# Patient Record
Sex: Female | Born: 1945 | Race: White | Hispanic: No | Smoking: Never smoker
Health system: Southern US, Community
[De-identification: ages and names within clinical notes are randomized; demographics above are authoritative.]

## PROBLEM LIST (undated history)

## (undated) DIAGNOSIS — M1990 Unspecified osteoarthritis, unspecified site: Secondary | ICD-10-CM

## (undated) DIAGNOSIS — R413 Other amnesia: Secondary | ICD-10-CM

## (undated) DIAGNOSIS — E7800 Pure hypercholesterolemia, unspecified: Secondary | ICD-10-CM

## (undated) DIAGNOSIS — M797 Fibromyalgia: Secondary | ICD-10-CM

## (undated) DIAGNOSIS — I519 Heart disease, unspecified: Secondary | ICD-10-CM

## (undated) DIAGNOSIS — Z9289 Personal history of other medical treatment: Secondary | ICD-10-CM

## (undated) DIAGNOSIS — I219 Acute myocardial infarction, unspecified: Secondary | ICD-10-CM

## (undated) DIAGNOSIS — E119 Type 2 diabetes mellitus without complications: Secondary | ICD-10-CM

## (undated) DIAGNOSIS — L4052 Psoriatic arthritis mutilans: Secondary | ICD-10-CM

## (undated) DIAGNOSIS — I10 Essential (primary) hypertension: Secondary | ICD-10-CM

## (undated) DIAGNOSIS — Z950 Presence of cardiac pacemaker: Secondary | ICD-10-CM

## (undated) DIAGNOSIS — I639 Cerebral infarction, unspecified: Secondary | ICD-10-CM

## (undated) HISTORY — DX: Other amnesia: R41.3

## (undated) HISTORY — DX: Psoriatic arthritis mutilans: L40.52

## (undated) HISTORY — PX: CARDIAC CATHETERIZATION: SHX172

## (undated) HISTORY — DX: Heart disease, unspecified: I51.9

## (undated) HISTORY — PX: TOTAL ABDOMINAL HYSTERECTOMY: SHX209

## (undated) HISTORY — PX: CHOLECYSTECTOMY: SHX55

## (undated) HISTORY — DX: Essential (primary) hypertension: I10

## (undated) HISTORY — PX: KNEE ARTHROSCOPY: SHX127

## (undated) HISTORY — PX: COLONOSCOPY: SHX174

## (undated) HISTORY — PX: CATARACT EXTRACTION W/ INTRAOCULAR LENS  IMPLANT, BILATERAL: SHX1307

## (undated) HISTORY — PX: INSERT / REPLACE / REMOVE PACEMAKER: SUR710

## (undated) HISTORY — PX: TONSILLECTOMY: SUR1361

## (undated) HISTORY — PX: TUBAL LIGATION: SHX77

---

## 1998-09-07 ENCOUNTER — Other Ambulatory Visit: Admission: RE | Admit: 1998-09-07 | Discharge: 1998-09-07 | Payer: Self-pay | Admitting: Obstetrics and Gynecology

## 1999-10-25 ENCOUNTER — Encounter: Admission: RE | Admit: 1999-10-25 | Discharge: 1999-10-25 | Payer: Self-pay

## 1999-11-25 ENCOUNTER — Ambulatory Visit (HOSPITAL_COMMUNITY): Admission: RE | Admit: 1999-11-25 | Discharge: 1999-11-25 | Payer: Self-pay | Admitting: *Deleted

## 2000-08-29 ENCOUNTER — Other Ambulatory Visit: Admission: RE | Admit: 2000-08-29 | Discharge: 2000-08-29 | Payer: Self-pay | Admitting: *Deleted

## 2005-04-08 ENCOUNTER — Ambulatory Visit (HOSPITAL_COMMUNITY): Admission: RE | Admit: 2005-04-08 | Discharge: 2005-04-08 | Payer: Self-pay | Admitting: Unknown Physician Specialty

## 2006-03-27 ENCOUNTER — Inpatient Hospital Stay (HOSPITAL_COMMUNITY): Admission: EM | Admit: 2006-03-27 | Discharge: 2006-04-01 | Payer: Self-pay | Admitting: Emergency Medicine

## 2006-05-28 ENCOUNTER — Emergency Department (HOSPITAL_COMMUNITY): Admission: EM | Admit: 2006-05-28 | Discharge: 2006-05-28 | Payer: Self-pay | Admitting: *Deleted

## 2006-09-26 ENCOUNTER — Encounter: Admission: RE | Admit: 2006-09-26 | Discharge: 2006-09-26 | Payer: Self-pay

## 2007-04-23 ENCOUNTER — Encounter: Admission: RE | Admit: 2007-04-23 | Discharge: 2007-04-23 | Payer: Self-pay

## 2007-08-20 ENCOUNTER — Inpatient Hospital Stay (HOSPITAL_COMMUNITY): Admission: RE | Admit: 2007-08-20 | Discharge: 2007-08-24 | Payer: Self-pay | Admitting: Orthopedic Surgery

## 2007-08-21 ENCOUNTER — Ambulatory Visit: Payer: Self-pay | Admitting: Physical Medicine & Rehabilitation

## 2007-08-23 ENCOUNTER — Encounter (INDEPENDENT_AMBULATORY_CARE_PROVIDER_SITE_OTHER): Payer: Self-pay | Admitting: Orthopedic Surgery

## 2007-08-23 ENCOUNTER — Ambulatory Visit: Payer: Self-pay | Admitting: Surgery

## 2007-08-30 ENCOUNTER — Emergency Department (HOSPITAL_COMMUNITY): Admission: EM | Admit: 2007-08-30 | Discharge: 2007-08-31 | Payer: Self-pay | Admitting: Emergency Medicine

## 2008-09-12 ENCOUNTER — Encounter: Admission: RE | Admit: 2008-09-12 | Discharge: 2008-09-12 | Payer: Self-pay

## 2008-12-09 ENCOUNTER — Encounter: Admission: RE | Admit: 2008-12-09 | Discharge: 2008-12-09 | Payer: Self-pay

## 2008-12-30 ENCOUNTER — Encounter: Admission: RE | Admit: 2008-12-30 | Discharge: 2008-12-30 | Payer: Self-pay

## 2009-11-22 ENCOUNTER — Emergency Department (HOSPITAL_COMMUNITY): Admission: EM | Admit: 2009-11-22 | Discharge: 2009-11-22 | Payer: Self-pay | Admitting: Emergency Medicine

## 2009-12-03 ENCOUNTER — Emergency Department (HOSPITAL_COMMUNITY): Admission: EM | Admit: 2009-12-03 | Discharge: 2009-12-03 | Payer: Self-pay | Admitting: Family Medicine

## 2010-06-29 NOTE — Discharge Summary (Signed)
Kristin Gomez, Kristin Gomez                ACCOUNT NO.:  [id number]   MEDICAL RECORD NO.:  [id number]          PATIENT TYPE:  INP   LOCATION:  5022                         FACILITY:  [facility]   PHYSICIAN:  Almedia Balls. Ranell Patrick, M.D. DATE OF BIRTH:  [birth year]   DATE OF ADMISSION:  08/20/2007  DATE OF DISCHARGE:  08/23/2007                               DISCHARGE SUMMARY   ADMISSION DIAGNOSIS:  Left knee end-stage osteoarthritis.   DISCHARGE DIAGNOSIS:  Left knee end-stage osteoarthritis.   BRIEF HISTORY:  The patient is a 65 year old female with worsening left  knee pain secondary to end-stage osteoarthritis.  The patient elected to  have a left total knee arthroplasty.   PROCEDURE:  The patient had a left total knee arthroplasty by Dr. Malon Kindle on August 20, 2007.   ASSISTANT:  Donnie Coffin. Durwin Nora, P.A.   General anesthesia was used, minimal blood loss, and no complications.   HOSPITAL COURSE:  The patient was admitted on August 20, 2007 for the above-  stated procedure which she tolerated well.  After adequate time in the  postanesthesia care unit, she was transferred up to 5000.  The patient  was complaining about some mild postoperative itching secondary to pain  medicine, but after administration of Benadryl, she was able to tolerate  the pain medicines.  On postop day #1, she has complained about moderate  pain in the left knee.  She did have some mild ecchymosis secondary to  the tourniquet which was seen readily right after surgery was performed,  but seemed to be accentuated on postop day #1, but she had no edema.  Her incision was healing well.  No signs of cellulitis, erythema, or  infection.  On postop day #2, the patient complained about moderate pain  in the left knee and states that the physical therapy was rough the day  before, was able to work through pain and work with physical therapy  quite well on postop day #2.  On postop day #3, the patient complained  about moderate  swelling in the ankle and she was moderately edematous.  She did have some mild calf tenderness.  Her dressing was healing well.  I did order a stat Doppler scan which was negative for DVT for the left  lower extremity and her INR being 1.9 and the patient doing fairly well  and swelling decreased, we decided to discharge her home on August 23, 2007.   DISCHARGE/PLAN:  The patient will be discharged home on August 23, 2007.  Her condition is stable.  Her diet is regular.  The patient will follow  back up with Dr. Malon Kindle in 2 weeks.   The patient has allergies to AMOXICILLIN, ASPIRIN, CELEBREX,  NITROFURANTOIN, MACROBID, PENICILLIN, SULFA, PREDNISONE, and REMICADE.   DISCHARGE MEDICATIONS:  1. Atenolol 50 mg p.o. b.i.d.  2. Digoxin 0.25 mg p.o. daily.  3. Cymbalta 60 mg p.o. daily.  4. Lasix 20 mg p.o. daily.  5. Robaxin 500 mg p.o. q.6 h.  6. Coumadin per pharmacy protocol.  7. Vicodin 5/325 one to tabs q.4-6 h. p.r.n.  pain.      Maisie Fus B. Durwin Nora, P.A.      Almedia Balls. Ranell Patrick, M.D.  Electronically Signed    TBD/MEDQ  D:  08/23/2007  T:  08/24/2007  Job:  [id number]

## 2010-06-29 NOTE — Op Note (Signed)
NAMELAKEITHIA, Kristin Gomez                ACCOUNT NO.:  [id number]   MEDICAL RECORD NO.:  [id number]          PATIENT TYPE:  INP   LOCATION:  5022                         FACILITY:  [facility]   PHYSICIAN:  Almedia Balls. Ranell Patrick, M.D. DATE OF BIRTH:  05/16/1945   DATE OF PROCEDURE:  08/20/2007  DATE OF DISCHARGE:                               OPERATIVE REPORT   PREOPERATIVE DIAGNOSIS:  Left knee end-stage osteoarthritis.   POSTOPERATIVE DIAGNOSIS:  Left knee end-stage osteoarthritis.   PROCEDURE PERFORMED:  Left total knee replacement using DePuy Sigma  rotating-platform prosthesis.   SURGEON:  Almedia Balls. Ranell Patrick, MD   ASSISTANT:  Donnie Coffin. Dixon, PA   ANESTHESIA:  General anesthesia plus femoral block anesthesia was used.   ESTIMATED BLOOD LOSS:  Minimal.   FLUID REPLACEMENT:  1400 mL crystalloid.   URINE OUTPUT:  100 mL.   TOURNIQUET TIME:  One and a quarter hours and 350 mmHg.   INSTRUMENT COUNT:  Correct.   COMPLICATIONS:  Preoperative antibiotics were given.   INDICATIONS:  The patient is a 65 year old female with worsening left  knee arthritis.  The patient now with disabling pain and functional  limitations desiring operative treatment to restore function and  eliminate pain.  Informed consent was obtained.   DESCRIPTION OF PROCEDURE:  After an adequate level of anesthesia was  achieved, the patient was positioned supine on the operating room table.  Foley catheter was placed.  The left leg correctly identified, and a  nonsterile tourniquet placed proximally.  The left leg sterilely prepped  and draped in the usual manner.  We went ahead and made a longitudinal  skin incision with the knee flexed and a tourniquet dissection was  carried down to subcutaneous tissues in the midline.  Medial  parapatellar arthrotomy was created.  Patella was everted, distal femur  is introduced using a step-cut drill.  Distal femoral resection using a  5 degrees to left setting with 10 mm resection.   We then performed  sizing, sizing to a size 3, performing a AP and chamfer cut using a four-  in-one block.  We then resected the ACL, PCL, and remaining meniscal  tissues, sublected to be anteriorly, performed a 90-degree perpendicular  cut to the long axis to the tibia resecting 4 mm off the affected  lateral side.  At this point, we checked the gaps with 10-mm blocks,  they were symmetric in flexion and extension.  We then went ahead and  completed her tibial preparation with sizing to a size 3 and then  performing a modular keel punch and drill to complete her tibial  preparation, we then performed her box cut to the size 3 femur.  I went  ahead and placed the trial components in place, reduced the knee.  We  were happy with the soft tissue balance and alignment.  We then went  ahead and we surfaced the patella going from a 24-mm taking [place] down to  16, resurfacing 8.5 mm of poly with the 32 sized patella.  At this  point, we went ahead and trialed the knee again  and we were happy with  the patellar tracking and soft tissue balance.  Removed all components.  Bone graft at the end of the femur with the available bone graft and  then thoroughly pulse irrigated removing all extraneous bony debris.  We  had also resected the posterior condyle off the back of the femur using  three-quarter-inch curved osteotome.  At this point, we went ahead and  submitted the components into place size 3 left femur, size 3 tibia with  the 12.5 insert placed knee in full extension and submitted the patella  in place, held in place with the patellar clamp.  Once the cement was  allowed to harden and removed excess cement using 0.25-inch curved  osteotome to trial the knee.  We are happy with the 12.5 poly with both  flexion and extension gaps balance and good soft tissue tracking of the  patella.  We next replaced the trial liner with the real 12.5 insert.  We reduced the knee and then thoroughly irrigated  and closed the knee  with interrupted PDS suture #1 for the parapatellar arthrotomy followed  by a 0 and 2-0 Vicryl layer subcutaneous closure and 4-0 Monocryl for  the skin.  Steri-Strips applied followed by sterile dressing.  The  patient tolerated the surgery well.      Almedia Balls. Ranell Patrick, M.D.  Electronically Signed     SRN/MEDQ  D:  08/20/2007  T:  08/21/2007  Job:  [id number]

## 2010-06-29 NOTE — H&P (Signed)
Kristin Gomez, Kristin Gomez                ACCOUNT NO.:  [id number]   MEDICAL RECORD NO.:  [id number]          PATIENT TYPE:  INP   LOCATION:  NA                           FACILITY:  [facility]   PHYSICIAN:  Maisie Fus B. Dixon, P.A.  DATE OF BIRTH:  July 16, 1945   DATE OF ADMISSION:  DATE OF DISCHARGE:                              HISTORY & PHYSICAL   CHIEF COMPLAINTS:  Left knee pain.   HISTORY OF PRESENT ILLNESS:  The patient is a 65 year old female with  worsening left knee pain that has been refractory to conservative  treatment.  The patient has elected to have a left total knee  arthroplasty by Dr. Malon Kindle.   PAST MEDICAL HISTORY:  1. Asthma.  2. Bronchitis.  3. Hypertension.  4. Congestive heart failure.  5. Pacemaker.  6. Hyperlipidemia.  7. GERD.   FAMILY MEDICAL HISTORY:  Coronary artery disease and CVA.   SOCIAL HISTORY:  The patient of Dr. Margo Gomez.  Does not smoke or use  alcohol.   ALLERGIES:  1. PENICILLIN.  2. ASPIRIN.  3. PIZZAS.  4. PREDNISONE.  5. DIOVAN.  6. AMPICILLIN.  7. SULFA.   CURRENT MEDICATIONS:  1. Atenolol 50 mg p.o. b.i.d.  2. Klor-Con 20 mg p.o. daily.  3. Digoxin 0.125 mg p.o. daily.  4. Lasix 20 mg p.o. daily.  5. Prevacid 30 mg p.o. daily.  6. Cymbalta 60 mg p.o. daily.  7. Erythromycin 250 mg t.i.d. for the next 2 days.   REVIEW OF SYSTEMS:  Painful gait and a recent abrasion to the right  lower extremity.   PHYSICAL EXAMINATION:  VITAL SIGNS:  Pulse 80, respirations 18, blood  pressure 142/82.  GENERAL:  The patient is a healthy-appearing 65 year old female in no  acute distress.  Pleasant, blunt affect, and oriented x3.  HEAD/NECK:  Cranial nerves II-XII grossly intact.  Neck shows full range  of motion without any tenderness.  CHEST:  She has active breath sounds bilaterally.  No wheezes, rhonchi,  or rales.  HEART:  Regular rate and rhythm.  No murmur.  ABDOMEN:  Nontender, nondistended with active bowel sounds.  EXTREMITIES:   Moderate tenderness to the left knee with moderate  crepitus.  Pain in the medial joint line.  The patient also has a large  abrasion.  No erythema or purulence to the right anterior leg.  She has  some minimal pedal edema distally.  NEUROVASCULAR:  She is intact.  She does have a normal gait.   X-rays show end-stage osteoarthritis to the left knee.   IMPRESSION:  End-stage osteoarthritis, left knee.   PLAN OF ACTION:  She will have a left total knee arthroplasty by Dr.  Malon Kindle.      Thomas B. Ferne Coe.     TBD/MEDQ  D:  08/15/2007  T:  08/15/2007  Job:  [id number]

## 2010-07-02 NOTE — Procedures (Signed)
Kristin Gomez. [facility]  Patient:    [name], [name]                       MRN: [id number] Adm. Date:  [id number] Attending:  Sabino Gasser                           Procedure Report  PROCEDURE:  Colonoscopy.  INDICATIONS:  Hemoccult positivity.  ANESTHESIA:  No further given.  DESCRIPTION OF PROCEDURE:  With the patient mildly sedated in the left lateral decubitus position, the Olympus videoscopic pediatric colonoscope was inserted in the rectum and passed under direct vision to the cecum.  The cecum was identified by ileocecal valve and appendiceal orifice, both of which were photographed.  From this point, the colonoscope was slowly withdrawn, taking circumferential views of the entire colonic mucosa, stopping only then in the rectum, which appeared normal in direct view and showed internal hemorrhoids in the retroflexed view.  The endoscope was straightened and withdrawn.  The patients vital signs and pulse oximetry remained stable.  The patient tolerated the procedure well without apparent complications.  FINDINGS:  Internal hemorrhoids, otherwise unremarkable colonoscopic examination to the cecum.  PLAN:  Consider repeat examination possibly in five years. DD:  11/25/99 TD:  11/26/99 Job: [id number]

## 2010-07-02 NOTE — Discharge Summary (Signed)
Kristin Gomez, Kristin Gomez                ACCOUNT NO.:  [id number]   MEDICAL RECORD NO.:  [id number]          PATIENT TYPE:  INP   LOCATION:  5504                         FACILITY:  [facility]   PHYSICIAN:  Beckey Rutter, MD  DATE OF BIRTH:  Mar 27, 1945   DATE OF ADMISSION:  03/27/2006  DATE OF DISCHARGE:  04/01/2006                               DISCHARGE SUMMARY   PRIMARY CARE PHYSICIAN:  Dr. Estill Bakes.   CHIEF COMPLAINT UPON ADMISSION:  Weakness, hypertension, and fever.   HISTORY OF PRESENT ILLNESS:  Ms. Oguin is a 65 year old African-  American with multiple medical problems.  Came in with weakness,  hypertension, fever.  The patient was treated for viral syndrome  although influenza type A and B were negative on the antigen screen.  The patient was treated with hydration, but she developed shortness of  breath and wheezes consistent with bronchial asthma.  She is treated for  bronchial asthma as well during this hospitalization and now she is  stable for discharge.  She still has hypokalemia and that will be  treated before discharge.  The patient was encouraged to continue on the  potassium supplementation that she has a prescription for.  The patient  was also encouraged to discuss with her primary physician the results of  the echo and the workup for heart failure since there is some test here  suggestive of elevation of BNP with a question of heart failure in mind.  The 2D echo is not done here.  Waiting for the results of the recent  echo done by the primary care physician, but hence now she is very  stable to be discharged.  We will not wait for the results of the echo.  Neither we would obtain echo right here during this hospitalization.  Patient aware and agrees with the discharge plan and she will be  discharged today on antibiotics to cover the lower left lobe pneumonia.  We will continue on Zithromax 500 mg for two more days and Ceftin for  five more days.   Asthma.   Stable now with no wheezes.  Tapering daily steroid with  discharge.   Psoriatic arthritis/fibromyalgia.  The patient is stable.  we will  continue current management.   Elevation of BNP.  The patient will be continued on Lasix and K-Dur  supplementation.   Hypertension is stable.  Continue on verapamil.  The patient is stable  for discharge today as discussed above.   DISCHARGE DIAGNOSES:  1. Viral syndrome.  2. Exacerbation of acute asthma.  3. Left lower lobe pneumonia.   DISCHARGE MEDICATIONS:  1. Zithromax 500 mg p.o. for two days.  2. Ceftin 500 mg p.o. b.i.d. for five days.  3. Lasix 20 mg p.o. daily.  4. K-Dur 20 mEq p.o. daily.  5. Medrol tapering dose.  6. Protonix p.o. 40 mg daily.  7. Verapamil 240 mg p.o. daily.  8. Elavil 10 mg p.o. at bedtime.   The patient is stable for discharge to follow up with her primary for  further management and medication reconciliation as well.  The  patient  is agreeable with her plan and also agreeable to the fact that there is  no point in staying in the hospital to wait for the results of the echo;  hence, she is clinically stable.      Beckey Rutter, MD  Electronically Signed     EME/MEDQ  D:  04/01/2006  T:  04/02/2006  Job:  [id number]

## 2010-07-02 NOTE — H&P (Signed)
Kristin Gomez, Kristin Gomez                ACCOUNT NO.:  [id number]   MEDICAL RECORD NO.:  [id number]          PATIENT TYPE:  INP   LOCATION:  1828                         FACILITY:  [facility]   PHYSICIAN:  Kristin Gomez, Kristin GomezDATE OF BIRTH:  December 27, 1945   DATE OF ADMISSION:  03/27/2006  DATE OF DISCHARGE:                              HISTORY & PHYSICAL   PRIMARY CARE PHYSICIAN:  Dr. Estill Bakes.   CHIEF COMPLAINT:  1. Weakness.  2. Hypotension.  3. Fever.   HISTORY OF PRESENT ILLNESS:  Ms. Kristin Gomez is a very pleasant 65-  year-old female with a complex medical history to include fibromyalgia.  She receives her primary medical care in [place] but is also followed  by Dr. Estill Bakes here in [place] for her fibromyalgia.  She reports  multiple complaints that have been going on for many months now.  Of  note, it appears that the patient's chronic steroid therapy was  discontinued, possibly in October of 2007, but possibly later.  She  states that since that time she has been not feeling normal.  Among  her complaints she relates intermittent constipation and diarrhea almost  daily.  Approximately 1-1/2 weeks ago she developed cough and subjective  fever and was given azithromycin for bronchitis by her primary care  physician in [place].  She completed this course without fail.  She  also required Diflucan, for a subsequent yeast infection, which she has  taken.  Since that time she has become progressively weaker.  She states  that she has gotten to the point that she could barely get out of bed.  She feels weak, dizzy when she sits up.  She has actually fallen once at  home, landing on her knees, suffering severe abrasions and significant  bleeding from this area.  Due to her progressive weakness and subjective  fever, she presented to Dr. Renaldo Reel office for evaluation today.  There  she was found to be markedly hypotensive with systolics in the 8-90  range.  She was noted to be  tachycardic.  Attempts were made to draw  [name] was not able to be obtained, per her report, due to her  severe dehydration.  As a result, she was transferred to the [facility]  Emergency Room for further evaluation and I was contacted to consider  admission.  Thus far, a urine has not been able to be obtained as the  patient has not produced any.   It is also noteworthy that the patient did have bilateral knee  injections approximately 1 week ago because of severe pain in her knees  due to a longstanding history of osteoarthritis.   REVIEW OF SYSTEMS:  The patient has a very complex review of systems  with positive elements virtually in every category within the review of  systems.  I have attempted to condense these into the History of Present  Illness, as noted above, with other noncontributory factors omitted for  the sake of brevity.   PAST MEDICAL HISTORY:  1. Fibromyalgia.  2. Status post appendectomy.  3. Status post  hysterectomy.  4. Status post tonsillectomy and adenoidectomy.  5. Status post bilateral cataract extractions.  6. Normal esophagogastroduodenoscopy October, 2001.  7. Internal hemorrhoids via colonoscopy October, 2001.  8. Hypertension.  9. Asthma.  10.Gastroesophageal reflux disease.  11.Hyperlipidemia.   OUTPATIENT MEDICATIONS:  1. Asmanex b.i.d.  2. Lasix 20 mg daily.  3. Medrol 12 mg daily - prescribed by Dr. Phylliss Bob.  4. Potassium chloride 40 mEq daily.  5. Verapamil 240 mg daily.  6. Cymbalta 60 mg daily.  7. Elavil 10 mg daily.  8. Lunesta.  9. Ultram 50 mg q.h.s.  10.Vicodin p.r.n.   ALLERGIES:  PATIENT REPORTS ALLERGIES TO:  1. AMOXICILLIN.  2. ASPIRIN.  3. CELEBREX.  4. MACROBID.  5. PENICILLIN.  6. PREDNISONE.  7. SULFA.  8. REMICADE.   FAMILY HISTORY:  The family history is reviewed but is noncontributory  to this admission.   SOCIAL HISTORY:  The patient does not smoke, she does not drink, she  does not use illicit  drugs.  She [place]   DATA REVIEW:  White count and platelet count are normal.  Hemoglobin is  11.1 with an MCV of 89.  Sodium is borderline low at 135, potassium is  normal at 4.1.  Chloride, bicarb, BUN, creatinine and serum glucose are  all normal.  Chest x-ray reveals mild cardiomegaly with moderate  bronchitic change with no focal infiltrate.   PHYSICAL EXAM:  Temperature 101.2, Gomez pressure 112/65, heart rate  123, respiratory rate 20, O2 sats 93% on room air.  GENERAL:  A well-developed, well-nourished female who appears quite  lethargic but is in no acute respiratory distress.  HEENT:  Normocephalic, atraumatic.  Pupils equal, round, reactive to  light and accommodation.  Extraocular muscles intact bilaterally.  OC/OP  clear.  NECK:  No lymphadenopathy, no JVD.  LUNGS:  Clear to auscultation bilaterally without wheezing or rhonchi.  CARDIOVASCULAR:  Tachycardic but regular, without appreciable murmur,  gallop or rub with normal S1 and S2.  ABDOMEN:  Slightly overweight.  Nontender, nondistended, soft, bowel  sounds present.  No hepatosplenomegaly, no rebound, no ascites.  EXTREMITIES:  No significant cyanosis, clubbing, edema bilateral lower  extremities.  NEUROLOGIC:  Strength 5-/5 bilateral upper and lower extremities, intact  sensation and touch throughout.  Cranial nerves II-XII intact  bilaterally.  Alert and oriented x4.  CUTANEOUS:  Patient has multiple severe skin tears on the right anterior  aspect of the lower extremity as well as the left in the region of the  knee.  MUSCULOSKELETAL:  Both knees are tender to palpation but there is no  evidence of joint effusion and no calor.   IMPRESSION AND PLAN:  1. Sign, symptom complex - Kristin Gomez presents with hypotension,      tachycardia, generalized fatigue, borderline low sodium and a     normal potassium.  She has a longstanding history of steroid use      and a recent  history of bronchitis.  The patient is displaying some      of the classic signs and symptoms of an addisonian crisis which was      potentially set off by recent acute infectious bronchitis.  I will      treat her with stress dose steroids via the IV and with hydration      and follow her clinical signs and symptoms.  We must also consider      the possibility of other complicating  factors.  Given the patient's      respiratory symptoms and fever, influenza is a possibility.  The      patient does report that she did have a flu vaccine, but this does      not completely rule out the possibility of influenza.  An influenza      screen will be obtained.  The patient also reports that she has had      frequent urinary tract infections.  Thus far, we have not been able      to obtain urine from the patient.  I will hold on antibiotics due      to the patient's multiple reported allergies until such time that a      UTI can be obtained.  2. Fibromyalgia - This appears to be reasonably well managed at the      present time.  I suspect the patient's high dose of hydrocortisone      will help assist in prevention of an acute flare.  We will follow      her symptomatically.  3. Normocytic anemia - The patient has had a colonoscopy as recent as      October of 2007, at which time benign polyps were found.  No      further GI evaluation concerning the patient's anemia is therefore      indicated.  Given the patient's diagnoses, I think it is safe to      assume that she has a chronic inflammatory state leading to an      anemia of chronic disease.  4. Hypertension - As noted above, the patient is presently      hypotensive.  We will therefore hold her antihypertensives.  5. Asthma - The patient reports a history of asthma.  Presently there      is no evidence of bronchospasm.  We will hold the patient's Asmanex      and follow her symptoms.  6. Chronic steroid use - The exact method by which the  patient's      steroids were discontinued and the exact timing of this      discontinuation is not clear.  What is clear is that Dr. Phylliss Bob had      intended for the patient to begin Medrol at 12 mg a day after her      appointment today.  At such time that the patient is stable for      discharge, we will titrate the patient back down to 12 mg a day and      leave further adjustments to her rheumatologist in the outpatient      setting.      Kristin Gomez, M.D.  Electronically Signed     JTM/MEDQ  D:  03/27/2006  T:  03/27/2006  Job:  [id number]   cc:   Areatha Keas, M.D.

## 2010-07-02 NOTE — Procedures (Signed)
. [facility]  Patient:    Kristin Gomez, Kristin Gomez                       MRN: [id number] Adm. Date:  [id number] Disc. Date: [id number] Attending:  Sabino Gasser                           Procedure Report  PROCEDURE:  Upper endoscopy.  INDICATION:  Hemoccult-positive stool. Abdominal pain.  ANESTHESIA:  Demerol 70 mg.  Versed 7 mg.  PROCEDURE:   With the patient mildly sedated in the left lateral decubitus position, the Olympus videoscopic endoscope was inserted into the mouth, passed under direct vision through the esophagus which appeared normal, into the stomach.  The fundus body, antrum, duodenal bulb, second portion of the duodenum were all well visualized and appeared normal.  Photographs were taken.  From this point, the endoscope was slowly withdrawn, taking circumferential views of the entire duodenal mucosa until the endoscope was pulled back into the stomach, placed in retroflexion view of the stomach from below and this too appeared normal and was photographed.  The endoscope was then straightened and pulled back from distal proximal stomach taking circumferential views of the entire gastric and subsequently esophageal mucosal which otherwise appeared normal.  The patients vital signs and pulse oximetry remained stable.  The patient tolerated the procedure well without apparent complications.  FINDINGS:  Essentially normal endoscopic examination.  PLAN:  Proceed to colonoscopy. DD:  11/25/99 TD:  11/26/99 Job: [id number]

## 2010-11-04 ENCOUNTER — Encounter (HOSPITAL_COMMUNITY): Payer: Self-pay | Admitting: *Deleted

## 2010-11-04 ENCOUNTER — Inpatient Hospital Stay (HOSPITAL_COMMUNITY)
Admission: AD | Admit: 2010-11-04 | Discharge: 2010-11-04 | Disposition: A | Discharge status: Home / Self Care | Payer: Medicare Other | Source: Ambulatory Visit | Attending: Obstetrics and Gynecology | Admitting: Obstetrics and Gynecology

## 2010-11-04 ENCOUNTER — Inpatient Hospital Stay (INDEPENDENT_AMBULATORY_CARE_PROVIDER_SITE_OTHER)
Admission: RE | Admit: 2010-11-04 | Discharge: 2010-11-04 | Disposition: A | Discharge status: Home / Self Care | Payer: Medicare Other | Source: Ambulatory Visit | Attending: Emergency Medicine | Admitting: Emergency Medicine

## 2010-11-04 DIAGNOSIS — N751 Abscess of Bartholin's gland: Secondary | ICD-10-CM | POA: Insufficient documentation

## 2010-11-04 HISTORY — DX: Presence of cardiac pacemaker: Z95.0

## 2010-11-04 HISTORY — DX: Unspecified osteoarthritis, unspecified site: M19.90

## 2010-11-04 HISTORY — DX: Fibromyalgia: M79.7

## 2010-11-04 MED ORDER — LIDOCAINE HCL 2 % EX GEL
Freq: Once | CUTANEOUS | Status: AC
  Administered 2010-11-04: 10 via TOPICAL
  Filled 2010-11-04: qty 20

## 2010-11-04 MED ORDER — DOXYCYCLINE HYCLATE 100 MG PO TABS
100.0000 mg | ORAL_TABLET | Freq: Once | ORAL | Status: AC
  Administered 2010-11-04: 100 mg via ORAL
  Filled 2010-11-04: qty 1

## 2010-11-04 MED ORDER — DOXYCYCLINE HYCLATE 100 MG PO CAPS: 100.0000 mg | ORAL_CAPSULE | Freq: Two times a day (BID) | ORAL | Status: AC

## 2010-11-04 MED ORDER — HYDROCODONE-ACETAMINOPHEN 5-325 MG PO TABS: 2.0000 | ORAL_TABLET | ORAL | Status: AC | PRN

## 2010-11-04 NOTE — Progress Notes (Signed)
Pt sent over from [facility] for bartholins cyst.  States she first noticed it over the weekend but has significantly grown since Monday.  Reports constant pain.

## 2010-11-04 NOTE — ED Provider Notes (Signed)
History     CSN: [id number] Arrival date & time: 11/04/2010  9:53 PM  Chief Complaint  Patient presents with  . Bartholin's Cyst    HPI  (Consider location/radiation/quality/duration/timing/severity/associated sxs/prior treatment)  HPI Kristin Gomez is a 65 y.o. female who presents to [facility] for tenderness and swelling of the right labia. The pain started earlier this week and she went to her primary care doctor but the area was not ready to drain. Tonight she went to Urgent Care and was referred here.  Past Medical History  Diagnosis Date  . Fibromyalgia   . Diabetes mellitus     controlled by diet  . Arthritis   . Pacemaker     Past Surgical History  Procedure Date  . Total abdominal hysterectomy   . Cholecystectomy   . Pacemaker insertion   . Cataract extraction   . Knee surgery     No family history on file.  History  Substance Use Topics  . Smoking status: Never Smoker   . Smokeless tobacco: Not on file  . Alcohol Use: No    OB History    Grav Para Term Preterm Abortions TAB SAB Ect Mult Living                  Review of Systems  Review of Systems  Genitourinary: Positive for vaginal pain.  All other systems reviewed and are negative.    Allergies  Aspirin; Remicade; Amoxicillin; Penicillins; Prednisone; and Sulfa antibiotics  Home Medications  No current outpatient prescriptions on file.  Physical Exam    BP 157/80  Pulse 77  Temp(Src) 98.1 F (36.7 C) (Oral)  Resp 18  Ht 5\' 5"  (1.651 m)  Wt 175 lb (79.379 kg)  BMI 29.12 kg/m2  Physical Exam  Nursing note and vitals reviewed. Constitutional: She is oriented to person, place, and time. She appears well-developed and well-nourished.  HENT:  Head: Normocephalic.  Eyes: EOM are normal.  Neck: Neck supple.  Pulmonary/Chest: Effort normal.  Genitourinary:       Tender swollen area at left Bartholin's gland.   Musculoskeletal: Normal range of motion.  Neurological: She is alert and  oriented to person, place, and time. No cranial nerve deficit.  Skin: Skin is warm and dry.    ED Course  Procedures Consent form signed. Time out.  Patient positioned and draped with sterile towels. Lidocaine gel applied to the area. Patient given hydrocodone for pain prior to discharge from urgent care.  Area cleaned with betadine. Anesthized with lidocaine 1%. Opened and drained with #11 blade. Drained moderate amount of purulent drainage. Probed with curved hemostat to break up loculations. Irrigated with NSS. Word Catheter placed and inflated with 3 cc. Of NS.  Pt. tolerated procedure well.    Labs Reviewed  GLUCOSE, CAPILLARY - Abnormal; Notable for the following:    Glucose-Capillary 161 (*)    All other components within normal limits    Assessment: Bartholin's abscess  Plan:  Discussed with Dr. Jolayne Panther   Doxycycline 100 mg. Po bid x 7 days   Hydrocodone 5/325 for pain   Sitz baths   Follow up in [facility], [place] 11/04/10 2334

## 2010-11-05 NOTE — ED Provider Notes (Signed)
Agree with above note.  Kristin Gomez 11/05/2010 12:29 AM    

## 2010-11-07 ENCOUNTER — Inpatient Hospital Stay (HOSPITAL_COMMUNITY)
Admission: AD | Admit: 2010-11-07 | Discharge: 2010-11-07 | Disposition: A | Discharge status: Home / Self Care | Payer: Medicare Other | Source: Ambulatory Visit | Attending: Obstetrics & Gynecology | Admitting: Obstetrics & Gynecology

## 2010-11-07 ENCOUNTER — Encounter (HOSPITAL_COMMUNITY): Payer: Self-pay

## 2010-11-07 DIAGNOSIS — N750 Cyst of Bartholin's gland: Secondary | ICD-10-CM | POA: Insufficient documentation

## 2010-11-07 NOTE — Progress Notes (Signed)
Patient reports has not taken her Vicodin since early yesterday, vomited x 1 yesterday, experiencing back discomfort. Has not self medicated.

## 2010-11-07 NOTE — ED Provider Notes (Signed)
History     CSN: [id number] Arrival date & time: 11/07/2010 10:22 AM  Chief Complaint  Patient presents with  . Bartholin's Cyst    HPI  (Consider location/radiation/quality/duration/timing/severity/associated sxs/prior treatment)  HPI.nam is a 65 y.o. who presents after her Word Catheter came out. Pt had I&D of Bartholin cyst abscess on 9/20. Is doing soaks at home, taking Rx antibiotic. Small amt pain. Past Medical History  Diagnosis Date  . Fibromyalgia   . Diabetes mellitus     controlled by diet  . Arthritis   . Pacemaker     Past Surgical History  Procedure Date  . Total abdominal hysterectomy   . Cholecystectomy   . Pacemaker insertion   . Cataract extraction   . Knee surgery     No family history on file.  History  Substance Use Topics  . Smoking status: Never Smoker   . Smokeless tobacco: Not on file  . Alcohol Use: No    OB History    Grav Para Term Preterm Abortions TAB SAB Ect Mult Living                  Review of Systems  Review of Systems  Constitutional: Negative for fever and chills.    Allergies  Aspirin; Remicade; Amoxicillin; Penicillins; Prednisone; and Sulfa antibiotics  Home Medications  No current outpatient prescriptions on file.  Physical Exam    BP 120/48  Pulse 51  Temp 98 F (36.7 C)  Resp 18  Physical Exam  Constitutional: She appears well-developed and well-nourished.  Genitourinary:       External genit- appropriate for age, < 1 cm incision L labia majora. No edema or erythema.  Skin: Skin is warm and dry.  Psychiatric: She has a normal mood and affect. Her behavior is normal.    ED Course  Procedures (including critical care time)  Labs Reviewed - No data to display No results found.   No diagnosis found.   MDM Assessment- S/P I&D Bartholin abscess, Word catheter out. Plan- Continue with warm soaks. GYN clinic to call with f/u appt. If symptoms return to return here.        Avon Gully.  Wyat Infinger 11/07/10 1145

## 2010-11-07 NOTE — Progress Notes (Signed)
Patient had Bartholin's cysts with catheter placement on 9/20, catheter came out today, still having a lot of pain .

## 2010-11-11 LAB — BASIC METABOLIC PANEL
BUN: 4 — ABNORMAL LOW
CO2: 26
Calcium: 7.6 — ABNORMAL LOW
Calcium: 7.8 — ABNORMAL LOW
Calcium: 8 — ABNORMAL LOW
Chloride: 107
Creatinine, Ser: 0.65
Creatinine, Ser: 0.69
Creatinine, Ser: 0.74
GFR calc Af Amer: >60
GFR calc Af Amer: >60
GFR calc non Af Amer: >60
GFR calc non Af Amer: >60
GFR calc non Af Amer: >60
Glucose, Bld: 109 — ABNORMAL HIGH
Potassium: 4.3
Sodium: 145

## 2010-11-11 LAB — PROTIME-INR
INR: 1.3
INR: 1.9 — ABNORMAL HIGH
Prothrombin Time: 12.2
Prothrombin Time: 13.2
Prothrombin Time: 22.2 — ABNORMAL HIGH

## 2010-11-11 LAB — CBC
HCT: 30.9 — ABNORMAL LOW
HCT: 42.9
Hemoglobin: 15
MCHC: 34.1
MCHC: 35.5
MCV: 96.7
Platelets: 140 — ABNORMAL LOW
Platelets: 171
Platelets: 293
RBC: 3.1 — ABNORMAL LOW
RDW: 13.8
RDW: 14.2
WBC: 9.5

## 2010-11-11 LAB — ABO/RH: ABO/RH(D): A POS

## 2010-11-11 LAB — DIFFERENTIAL
Eosinophils Relative: 0
Lymphocytes Relative: 21
Lymphs Abs: 2
Monocytes Absolute: 0.9

## 2010-11-11 LAB — URINALYSIS, ROUTINE W REFLEX MICROSCOPIC
Glucose, UA: NEGATIVE
Nitrite: NEGATIVE
Protein, ur: NEGATIVE
Urobilinogen, UA: 0.2

## 2010-11-11 LAB — TYPE AND SCREEN

## 2010-11-12 LAB — CBC
MCHC: 35.2
MCV: 98.5
Platelets: 367
WBC: 7.1

## 2010-11-12 LAB — DIFFERENTIAL
Eosinophils Relative: 2
Lymphs Abs: 2
Monocytes Absolute: 0.9
Monocytes Relative: 12
Neutrophils Relative %: 57

## 2010-11-12 LAB — CULTURE, BLOOD (ROUTINE X 2): Culture: NO GROWTH

## 2010-11-25 ENCOUNTER — Encounter: Payer: Self-pay | Admitting: Family Medicine

## 2010-11-25 ENCOUNTER — Encounter: Payer: Medicare Other | Admitting: Family Medicine

## 2010-11-25 ENCOUNTER — Ambulatory Visit (INDEPENDENT_AMBULATORY_CARE_PROVIDER_SITE_OTHER): Payer: Medicare Other | Admitting: Family Medicine

## 2010-11-25 VITALS — BP 128/79 | HR 72 | Temp 97.0°F | Ht 64.25 in | Wt 172.7 lb

## 2010-11-25 DIAGNOSIS — I10 Essential (primary) hypertension: Secondary | ICD-10-CM

## 2010-11-25 DIAGNOSIS — N951 Menopausal and female climacteric states: Secondary | ICD-10-CM

## 2010-11-25 DIAGNOSIS — IMO0001 Reserved for inherently not codable concepts without codable children: Secondary | ICD-10-CM

## 2010-11-25 DIAGNOSIS — R232 Flushing: Secondary | ICD-10-CM

## 2010-11-25 DIAGNOSIS — M797 Fibromyalgia: Secondary | ICD-10-CM

## 2010-11-25 DIAGNOSIS — E049 Nontoxic goiter, unspecified: Secondary | ICD-10-CM

## 2010-11-25 DIAGNOSIS — N750 Cyst of Bartholin's gland: Secondary | ICD-10-CM

## 2010-11-25 NOTE — Progress Notes (Signed)
  Subjective:    Patient ID: Kristin Gomez, female    DOB: [birth year], 65 y.o.   MRN: [id number]  HPI Patient presents for follow-up from a Bartholin's Gland cyst that was treated in [facility] on 9/20 and seen again after the Ward Catheter fell out on 9/23. Patient reports this was her first issue with the bartholin's gland and that she no longer has any symptoms. She completed her antibiotics although they did make her nauseas. She is not currently sexually active. She sees her Primary Doc for routine health maintenence, and gets her mammograms and well checks with him. She reports always having a normal pap test. She is menopausal. She complains only of hot flashes and sweating all the time today. She states her primary doc states it has nothing to do with her hormones and several months ago checked her thyroid. She has a history of thyroid goiter.  Review of Systems    Neg for fevers, chills, weight los Recently had some lesions on her forearms treated w/ freezing Menopausal Objective:   Physical Exam General: AAO, NAD Heart: RRR, no murmurs Lungs: CTA B/L External Genitalia: Minimal hair Internal Genitalia: loss of rugae and moisture, no erythema, no swelling of bartholin's gland on either side; mild TTP of vaginal opening Speculum exam not performed/indicated     Assessment & Plan:  Bartholin's Gland Cyst: Resolved  Hot Flashes/Sweating: May be related to menopause; this is discussed. She has a history of goiter, so we will check her TFTs today. I will contact her with the results when they are available so that she does not have to have another appointment. If her TFTs are abnormal, the patient understands she will work with her [facility] for treatment. Information is provided on menopause and herbals that are often useful for her symptoms.

## 2010-11-25 NOTE — Patient Instructions (Signed)
Menopause Menopause is the normal time of life when menstrual periods stop completely. Menopause is complete when you have missed 12 consecutive menstrual periods. It usually occurs between the ages of 27-55, with an average age of 39. Very rarely does a woman develop menopause before 65 years old. At menopause, your ovaries stop producing the female hormones, estrogen and progesterone. This can cause undesirable symptoms and also affect your health. Sometimes the symptoms may occur 4-5 years before the menopause begins. There is no relationship between oral contraceptives, number of children you had, race or the age your menstrual periods started (menarche). Heavy smokers and very thin women may develop menopause earlier in life. CAUSE  The ovaries stop producing the female hormones estrogen and progesterone.   Other causes include:   Surgery to remove both ovaries.  The ovaries stop functioning for no known reason.   Tumors of the pituitary gland in the brain.   Medical disease that affects the ovaries and hormone production.  Radiation treatment to the abdomen or pelvis.   Chemotherapy that affects the ovaries.   SYMPTOMS  Hot flashes.  Night sweats.   Decrease in sex drive.   Vaginal dryness and shrinking of the size of the genital organs causing painful intercourse.   Dryness of the skin and developing wrinkles.  Headaches.   Tiredness.   Irritability.   Memory problems.  Weight gain.   Bladder infections.   Hair growth of the face and chest.   Infertility.   More serious symptoms include:  Loss of bone (osteoporosis) causing breaks (fractures).   Depression.   Hardening and narrowing of the arteries (atherosclerosis) causing heart attacks and strokes.  DIAGNOSIS  When the menstrual periods have stopped for 12 straight months.   Physical exam.   Hormone studies of the blood.  TREATMENT There are many treatment choices and nearly as many questions about  them. The decisions to treat or not to treat menopausal changes is an individual decision made with your caregiver. Your caregiver can discuss the treatments with you. Together, you can decide which treatment will work best for you such as:  Hormone replacement treatment.  Treat the individual symptoms with medication (ex. tranquilizer for depression).   Herbal medications that may help specific symptoms.  Counseling by a psychiatrist or psychologist.   Group therapy.   No treatment.   HOME CARE INSTRUCTIONS  Take the medication your caregiver gives you as directed.   Get plenty of sleep and rest.   Exercise regularly.   Eat a diet that contains calcium (good for the bones) and soy products (acts like estrogen hormone).   Avoid alcoholic beverages.   Do not smoke.   Taking vitamin E may help in certain cases.   If you have hot flashes, dress in layers.   Take supplements, calcium and vitamin D to strengthen bones.   You can use over-the-counter vaginal cream for vaginal dryness.   Group therapy is sometimes very helpful.   Acupuncture may be helpful in some cases.  SEEK MEDICAL CARE IF:  You are not sure you are in the menopause.   You are having menopausal symptoms and need advice and treatment.   You are still having menstrual periods after age 50.   You have pain with intercourse.   You are in the menopause (no menstrual period for 12 months) and develop vaginal bleeding.   You need a referral to a specialist (gynecologist, psychiatrist or psychologist) for treatment.  SEEK IMMEDIATE MEDICAL CARE  IF:  You have severe depression.   You have excessive vaginal bleeding.   You fell and think you have a broken bone.   You have pain when you urinate.   You develop leg or chest pain.   You have a fast pounding heart beat (palpitations).   You have severe headaches.   You develop vision problems.   You feel a lump in your breast.   You have abdominal  pain or severe indigestion.  Document Released: 04/23/2003 Document Re-Released: 12/04/2007 [facility] Patient Information 2011 [place], [place]. Menopause and Herbal Products Menopause is the normal time of life when menstrual periods stop completely. Menopause is complete when you have missed 12 consecutive menstrual periods. It usually occurs between the ages of 25-55, with an average age of 46. Very rarely does a woman develop menopause before 65 years old. At menopause, your ovaries stop producing the female hormones, estrogen and progesterone. This can cause undesirable symptoms and also affect your health. Sometimes the symptoms may occur 4-5 years before the menopause begins. There is no relationship between oral contraceptives, number of children you had, race or the age your menstrual periods started (menarche). Heavy smokers and very thin women may develop menopause earlier in life. Estrogen and progesterone hormone treatment is the usual method of treating menopausal symptoms. However, there are women who should not take hormone treatment. This is true of:   Women that have breast or uterine cancer.   Women who prefer not to take hormones because of certain side effects (abnormal uterine bleeding).   Women who are afraid that hormones may cause breast cancer.  For these women, there are other medications that may help treat their menopausal symptoms. These medications are found in plants and botanical products. They can be found in the form of herbs, teas, oils, tinctures and pills.  CAUSES OF MENOPAUSE:  The ovaries stop producing the female hormones estrogen and progesterone.   Other causes include:   Surgery to remove both ovaries.  The ovaries stop functioning for no known reason.   Tumors of the pituitary gland in the brain.   Medical disease that affects the ovaries and hormone production.  Radiation treatment to the abdomen or pelvis.   Chemotherapy that affects the  ovaries.   PHYTOESTROGENS: Phytoestrogen's occur naturally in plants and plant products. They act like estrogen in the body. Herbal medications are made from these plants and botanical steroids. There are three types of phytoestrogens:  Isoflavones (genistein and daidzein) are found in soy, garbanzo beans, miso and tofu foods.   Ligins are found in the shell of seeds. They are used to make oils like flaxseed oil. The bacteria in your intestine act on these foods to produce the estrogen-like hormones.   Coumestans are more estrogen-like for animals than humans. They are found in sunflower seeds and bean sprouts.  TREATMENT WITH HERBAL MEDICATIONS FOR:  Hot flashes and night sweats.   Soy, black cohosh and evening primrose.   Irritability, insomnia, depression and memory problems.   Casteberry, ginseng, and soy.   St. John's wort may be helpful for depression. However, there is a concern of it causing cataracts of the eye and may have bad effects on other medications. St. John's wort should not be taken for long time and without your caregiver's advice.   Loss of libido and vaginal and skin dryness.   Wild yam and soy.   Prevention of coronary heart disease and osteoporosis.   Soy and Isoflavones.  Other  forms of treatment to help women with menopausal symptoms include a balanced diet, rest, exercise, vitamin and calcium (with vitamin D) supplements, acupuncture and group therapy when necessary. WOMEN WHO SHOULD NOT TAKE HERBAL MEDICATIONS:  Infants, children and elderly women unless told by your caregiver.   Women who are planning on getting pregnant unless told by your caregiver.   Women who are breastfeeding unless told by your caregiver.   Women who are taking other prescription medications unless told by your caregiver.  Different herbal medications have different and unmeasured amounts of the herbal ingredients. There are no regulations, quality control and standardization  of the ingredients in herbal medications. Therefore, the amount of the ingredient in the medication may vary from one herb, pill, tea, oil or tincture to another. Many herbal medications can cause serious problems and can even have poisonous effects if taken too much or too long. If problems develop, the medication should be stopped and recorded by your caregiver. HOME CARE INSTRUCTIONS  Do not take or give children herbal medications without your caregiver's advice.   Let your caregiver know all the medications you are taking. This includes prescription, over-the-counter, eye drops and creams.   Do not take herbal medications longer or more than recommended.   Tell your caregiver about any side effects from the medication.  SEEK MEDICAL CARE IF:  You develop a fever of 100.4 F, or as directed by your caregiver.   You feel sick to your stomach (nauseous), vomit or have diarrhea.   You develop a rash.   You develop abdominal pain.   You develop severe headaches.   You start to have vision problems.   You feel dizzy or faint.   You start to feel numbness in any part of your body.   You start shaking (have convulsions).  Document Released: 07/20/2007 Document Re-Released: 07/21/2009 [facility] Patient Information 2011 [place], [place].

## 2010-11-25 NOTE — Progress Notes (Signed)
Fall screen completed- pt reports no fall 6 months.

## 2010-11-26 LAB — TSH: TSH: 1.039 u[IU]/mL (ref 0.350–4.500)

## 2010-11-26 LAB — T3: T3, Total: 96.8 ng/dL (ref 80.0–204.0)

## 2011-03-07 ENCOUNTER — Other Ambulatory Visit: Payer: Self-pay | Admitting: Orthopedic Surgery

## 2011-03-07 ENCOUNTER — Encounter (HOSPITAL_BASED_OUTPATIENT_CLINIC_OR_DEPARTMENT_OTHER): Payer: Self-pay | Admitting: *Deleted

## 2011-03-07 ENCOUNTER — Other Ambulatory Visit: Payer: Self-pay

## 2011-03-07 ENCOUNTER — Encounter (HOSPITAL_BASED_OUTPATIENT_CLINIC_OR_DEPARTMENT_OTHER)
Admission: RE | Admit: 2011-03-07 | Discharge: 2011-03-07 | Disposition: A | Discharge status: Home / Self Care | Payer: Medicare Other | Source: Ambulatory Visit | Attending: Orthopedic Surgery | Admitting: Orthopedic Surgery

## 2011-03-07 LAB — BASIC METABOLIC PANEL
BUN: 21 mg/dL (ref 6–23)
CO2: 26 mEq/L (ref 19–32)
Chloride: 105 mEq/L (ref 96–112)
GFR calc Af Amer: 79 mL/min — ABNORMAL LOW (ref >90)
Potassium: 4.1 mEq/L (ref 3.5–5.1)

## 2011-03-07 NOTE — Progress Notes (Signed)
Called cardiology-had not had ekg in over 1 yr-ekg done-forn faxed to cardiology as to what he wanted to do with device during surg-also faxed new ekg showing questionable failure-showed to dr Doreatha Lew ok to him-she would need to be done [facility]

## 2011-03-07 NOTE — Progress Notes (Signed)
Called cardiology [place]-no ekg in 2 yr Has pacemaker bmet-ekg done

## 2011-03-08 ENCOUNTER — Encounter (HOSPITAL_BASED_OUTPATIENT_CLINIC_OR_DEPARTMENT_OTHER): Payer: Self-pay | Admitting: Orthopedic Surgery

## 2011-03-08 ENCOUNTER — Encounter (HOSPITAL_BASED_OUTPATIENT_CLINIC_OR_DEPARTMENT_OTHER): Payer: Self-pay | Admitting: Certified Registered Nurse Anesthetist

## 2011-03-08 ENCOUNTER — Ambulatory Visit (HOSPITAL_BASED_OUTPATIENT_CLINIC_OR_DEPARTMENT_OTHER)
Admission: RE | Admit: 2011-03-08 | Discharge: 2011-03-08 | Disposition: A | Discharge status: Home / Self Care | Payer: Medicare Other | Source: Ambulatory Visit | Attending: Orthopedic Surgery | Admitting: Orthopedic Surgery

## 2011-03-08 ENCOUNTER — Encounter (HOSPITAL_BASED_OUTPATIENT_CLINIC_OR_DEPARTMENT_OTHER): Payer: Self-pay | Admitting: *Deleted

## 2011-03-08 ENCOUNTER — Other Ambulatory Visit: Payer: Self-pay | Admitting: Orthopedic Surgery

## 2011-03-08 ENCOUNTER — Ambulatory Visit (HOSPITAL_BASED_OUTPATIENT_CLINIC_OR_DEPARTMENT_OTHER): Payer: Medicare Other | Admitting: Certified Registered Nurse Anesthetist

## 2011-03-08 ENCOUNTER — Encounter (HOSPITAL_BASED_OUTPATIENT_CLINIC_OR_DEPARTMENT_OTHER)
Admission: RE | Disposition: A | Discharge status: Home / Self Care | Payer: Self-pay | Source: Ambulatory Visit | Attending: Orthopedic Surgery

## 2011-03-08 DIAGNOSIS — Z950 Presence of cardiac pacemaker: Secondary | ICD-10-CM | POA: Insufficient documentation

## 2011-03-08 DIAGNOSIS — I10 Essential (primary) hypertension: Secondary | ICD-10-CM | POA: Insufficient documentation

## 2011-03-08 DIAGNOSIS — E119 Type 2 diabetes mellitus without complications: Secondary | ICD-10-CM | POA: Insufficient documentation

## 2011-03-08 DIAGNOSIS — L4050 Arthropathic psoriasis, unspecified: Secondary | ICD-10-CM | POA: Insufficient documentation

## 2011-03-08 DIAGNOSIS — Z01810 Encounter for preprocedural cardiovascular examination: Secondary | ICD-10-CM | POA: Insufficient documentation

## 2011-03-08 DIAGNOSIS — M6740 Ganglion, unspecified site: Secondary | ICD-10-CM | POA: Insufficient documentation

## 2011-03-08 DIAGNOSIS — J45909 Unspecified asthma, uncomplicated: Secondary | ICD-10-CM | POA: Insufficient documentation

## 2011-03-08 DIAGNOSIS — IMO0001 Reserved for inherently not codable concepts without codable children: Secondary | ICD-10-CM | POA: Insufficient documentation

## 2011-03-08 HISTORY — PX: MASS EXCISION: SHX2000

## 2011-03-08 LAB — CULTURE, ROUTINE-ABSCESS

## 2011-03-08 LAB — POCT HEMOGLOBIN-HEMACUE: Hemoglobin: 13.3 g/dL (ref 12.0–15.0)

## 2011-03-08 SURGERY — EXCISION MASS
Anesthesia: Monitor Anesthesia Care | Laterality: Left | Wound class: Dirty or Infected

## 2011-03-08 MED ORDER — ONDANSETRON HCL 4 MG/2ML IJ SOLN
INTRAMUSCULAR | Status: DC | PRN
  Administered 2011-03-08: 4 mg via INTRAVENOUS

## 2011-03-08 MED ORDER — 0.9 % SODIUM CHLORIDE (POUR BTL) OPTIME
TOPICAL | Status: DC | PRN
  Administered 2011-03-08: 1000 mL

## 2011-03-08 MED ORDER — ETOMIDATE 2 MG/ML IV SOLN
INTRAVENOUS | Status: DC | PRN
  Administered 2011-03-08: 16 mg via INTRAVENOUS

## 2011-03-08 MED ORDER — TRAMADOL HCL 50 MG PO TABS: ORAL_TABLET | ORAL | Status: DC

## 2011-03-08 MED ORDER — HYDROCODONE-ACETAMINOPHEN 5-325 MG PO TABS: ORAL_TABLET | ORAL | Status: DC

## 2011-03-08 MED ORDER — SCOPOLAMINE 1 MG/3DAYS TD PT72
MEDICATED_PATCH | TRANSDERMAL | Status: DC | PRN
  Administered 2011-03-08: 1 via TRANSDERMAL

## 2011-03-08 MED ORDER — LABETALOL HCL 5 MG/ML IV SOLN
10.0000 mg | INTRAVENOUS | Status: DC | PRN
  Administered 2011-03-08: 10 mg via INTRAVENOUS

## 2011-03-08 MED ORDER — VANCOMYCIN HCL 1000 MG IV SOLR
1000.0000 mg | INTRAVENOUS | Status: DC | PRN
  Administered 2011-03-08: 1000 mg via INTRAVENOUS

## 2011-03-08 MED ORDER — HYDROMORPHONE HCL PF 1 MG/ML IJ SOLN: 0.2500 mg | INTRAMUSCULAR | Status: DC | PRN

## 2011-03-08 MED ORDER — ONDANSETRON HCL 4 MG/2ML IJ SOLN
4.0000 mg | Freq: Four times a day (QID) | INTRAMUSCULAR | Status: DC | PRN
Start: 2011-03-08 — End: 2011-03-08

## 2011-03-08 MED ORDER — BUPIVACAINE HCL (PF) 0.25 % IJ SOLN
INTRAMUSCULAR | Status: DC | PRN
  Administered 2011-03-08: 8 mL

## 2011-03-08 MED ORDER — LACTATED RINGERS IV SOLN
INTRAVENOUS | Status: DC
  Administered 2011-03-08 (×2): via INTRAVENOUS

## 2011-03-08 MED ORDER — FENTANYL CITRATE 0.05 MG/ML IJ SOLN
INTRAMUSCULAR | Status: DC | PRN
  Administered 2011-03-08: 50 ug via INTRAVENOUS
  Administered 2011-03-08 (×3): 25 ug via INTRAVENOUS

## 2011-03-08 SURGICAL SUPPLY — 57 items
APL SKNCLS STERI-STRIP NONHPOA (GAUZE/BANDAGES/DRESSINGS)
BANDAGE COBAN STERILE 2 (GAUZE/BANDAGES/DRESSINGS) IMPLANT
BANDAGE CONFORM 2  STR LF (GAUZE/BANDAGES/DRESSINGS) IMPLANT
BANDAGE ELASTIC 3 VELCRO ST LF (GAUZE/BANDAGES/DRESSINGS) IMPLANT
BANDAGE GAUZE ELAST BULKY 4 IN (GAUZE/BANDAGES/DRESSINGS) IMPLANT
BANDAGE GAUZE STRT 1 STR LF (GAUZE/BANDAGES/DRESSINGS) IMPLANT
BENZOIN TINCTURE PRP APPL 2/3 (GAUZE/BANDAGES/DRESSINGS) IMPLANT
BLADE MINI RND TIP GREEN BEAV (BLADE) IMPLANT
BLADE SURG 15 STRL LF DISP TIS (BLADE) ×2 IMPLANT
BLADE SURG 15 STRL SS (BLADE) ×4
BNDG CMPR 9X4 STRL LF SNTH (GAUZE/BANDAGES/DRESSINGS) ×1
BNDG CMPR MD 5X2 ELC HKLP STRL (GAUZE/BANDAGES/DRESSINGS)
BNDG COHESIVE 1X5 TAN STRL LF (GAUZE/BANDAGES/DRESSINGS) IMPLANT
BNDG ELASTIC 2 VLCR STRL LF (GAUZE/BANDAGES/DRESSINGS) IMPLANT
BNDG ESMARK 4X9 LF (GAUZE/BANDAGES/DRESSINGS) ×1 IMPLANT
BNDG PLASTER X FAST 3X3 WHT LF (CAST SUPPLIES) IMPLANT
BNDG PLSTR 9X3 FST ST WHT (CAST SUPPLIES)
CHLORAPREP W/TINT 26ML (MISCELLANEOUS) ×2 IMPLANT
CLOTH BEACON ORANGE TIMEOUT ST (SAFETY) ×2 IMPLANT
CORDS BIPOLAR (ELECTRODE) ×2 IMPLANT
COVER MAYO STAND STRL (DRAPES) ×2 IMPLANT
COVER TABLE BACK 60X90 (DRAPES) ×2 IMPLANT
CUFF TOURNIQUET SINGLE 18IN (TOURNIQUET CUFF) ×2 IMPLANT
DRAPE EXTREMITY T 121X128X90 (DRAPE) ×2 IMPLANT
DRAPE SURG 17X23 STRL (DRAPES) ×1 IMPLANT
GAUZE PACKING IODOFORM 1/4X5 (PACKING) ×1 IMPLANT
GAUZE SPONGE 4X4 12PLY STRL LF (GAUZE/BANDAGES/DRESSINGS) ×1 IMPLANT
GAUZE XEROFORM 1X8 LF (GAUZE/BANDAGES/DRESSINGS) ×2 IMPLANT
GLOVE BIO SURGEON STRL SZ 6.5 (GLOVE) ×1 IMPLANT
GLOVE BIO SURGEON STRL SZ7.5 (GLOVE) ×2 IMPLANT
GLOVE BIOGEL PI IND STRL 6.5 (GLOVE) IMPLANT
GLOVE BIOGEL PI INDICATOR 6.5 (GLOVE) ×1
GOWN PREVENTION PLUS XLARGE (GOWN DISPOSABLE) ×2 IMPLANT
GOWN STRL REIN XL XLG (GOWN DISPOSABLE) ×2 IMPLANT
LOOP VESSEL MINI RED (MISCELLANEOUS) ×1 IMPLANT
NDL HYPO 25X1 1.5 SAFETY (NEEDLE) ×1 IMPLANT
NEEDLE HYPO 25X1 1.5 SAFETY (NEEDLE) ×2 IMPLANT
NS IRRIG 1000ML POUR BTL (IV SOLUTION) ×2 IMPLANT
PACK BASIN DAY SURGERY FS (CUSTOM PROCEDURE TRAY) ×2 IMPLANT
PAD CAST 3X4 CTTN HI CHSV (CAST SUPPLIES) IMPLANT
PAD CAST 4YDX4 CTTN HI CHSV (CAST SUPPLIES) IMPLANT
PADDING CAST ABS 4INX4YD NS (CAST SUPPLIES)
PADDING CAST ABS COTTON 4X4 ST (CAST SUPPLIES) ×1 IMPLANT
PADDING CAST COTTON 3X4 STRL (CAST SUPPLIES)
PADDING CAST COTTON 4X4 STRL (CAST SUPPLIES)
SPLINT FNGR PLAIN END 5/8X3.25 (CAST SUPPLIES) IMPLANT
SPLINT PLASTALUME 3 1/4 (CAST SUPPLIES) ×2
SPONGE GAUZE 4X4 12PLY (GAUZE/BANDAGES/DRESSINGS) ×2 IMPLANT
STOCKINETTE 4X48 STRL (DRAPES) ×2 IMPLANT
STRIP CLOSURE SKIN 1/2X4 (GAUZE/BANDAGES/DRESSINGS) IMPLANT
SUT ETHILON 3 0 PS 1 (SUTURE) IMPLANT
SUT ETHILON 4 0 PS 2 18 (SUTURE) ×2 IMPLANT
SYR BULB 3OZ (MISCELLANEOUS) ×2 IMPLANT
SYR CONTROL 10ML LL (SYRINGE) ×2 IMPLANT
TOWEL OR 17X24 6PK STRL BLUE (TOWEL DISPOSABLE) ×3 IMPLANT
UNDERPAD 30X30 INCONTINENT (UNDERPADS AND DIAPERS) ×2 IMPLANT
WATER STERILE IRR 1000ML POUR (IV SOLUTION) ×2 IMPLANT

## 2011-03-08 NOTE — Anesthesia Procedure Notes (Signed)
Procedure Name: LMA Insertion Date/Time: 03/08/2011 8:01 AM Performed by: Jamail Cullers D Pre-anesthesia Checklist: Patient identified, Emergency Drugs available, Suction available and Patient being monitored Patient Re-evaluated:Patient Re-evaluated prior to inductionOxygen Delivery Method: Circle System Utilized Preoxygenation: Pre-oxygenation with 100% oxygen Intubation Type: IV induction Ventilation: Mask ventilation without difficulty LMA: LMA inserted LMA Size: 4.0 Number of attempts: 1 Placement Confirmation: positive ETCO2 Tube secured with: Tape Dental Injury: Teeth and Oropharynx as per pre-operative assessment

## 2011-03-08 NOTE — Transfer of Care (Signed)
Immediate Anesthesia Transfer of Care Note  Patient: Kristin Gomez  Procedure(s) Performed:  EXCISION MASS - Excision Mass Left Long Finger and Debridement of Distal Interphalangeal  Joint  Patient Location: PACU  Anesthesia Type: General  Level of Consciousness: awake, alert , oriented and patient cooperative  Airway & Oxygen Therapy: Patient Spontanous Breathing and Patient connected to face mask oxygen  Post-op Assessment: Report given to PACU RN and Post -op Vital signs reviewed and stable  Post vital signs: Reviewed and stable  Complications: No apparent anesthesia complications

## 2011-03-08 NOTE — Anesthesia Postprocedure Evaluation (Signed)
Anesthesia Post Note  Patient: Kristin Gomez  Procedure(s) Performed:  EXCISION MASS - Excision Mass Left Long Finger and Debridement of Distal Interphalangeal  Joint  Anesthesia type: General  Patient location: PACU  Post pain: Pain level controlled and Adequate analgesia  Post assessment: Post-op Vital signs reviewed, Patient's Cardiovascular Status Stable, Respiratory Function Stable, Patent Airway and Pain level controlled  Last Vitals:  Filed Vitals:   03/08/11 1000  BP: 170/79  Pulse: 81  Temp:   Resp: 18    Post vital signs: Reviewed and stable  Level of consciousness: awake, alert  and oriented  Complications: No apparent anesthesia complications

## 2011-03-08 NOTE — H&P (Signed)
Kristin Gomez is an 66 y.o. female.   Chief Complaint: infected mucoid cyst HPI: 66 yo rhd female with bump on left long finger that she popped last week.  Began to have swelling and erythema three days ago.  No fevers, chills, night sweats.  Pain with palpation.  Past Medical History  Diagnosis Date  . Pacemaker   . Fibromyalgia   . Hypertension   . Heart disease   . Asthma   . Diabetes mellitus     controlled by diet  . Psoriatic arthritis, destructive type     Past Surgical History  Procedure Date  . Pacemaker insertion   . Cataract extraction   . Knee surgery   . Total abdominal hysterectomy   . Cholecystectomy   . Tonsillectomy   . Colonoscopy     Family History  Problem Relation Age of Onset  . Alzheimer's disease Mother   . Heart attack Father    Social History:  reports that she has never smoked. She does not have any smokeless tobacco history on file. She reports that she does not drink alcohol or use illicit drugs.  Allergies:  Allergies  Allergen Reactions  . Azithromycin Anaphylaxis  . Aspirin Other (See Comments)    Patient states that is causes an asthma attack.  . Avelox (Moxifloxacin Hcl In Nacl) Swelling  . Diovan (Valsartan) Swelling  . Remicade (Infliximab) Hives  . Amoxicillin Rash  . Penicillins Rash  . Prednisone Rash  . Propofol Rash  . Sulfa Antibiotics Rash    Medications Prior to Admission  Medication Dose Route Frequency Provider Last Rate Last Dose  . lactated ringers infusion   Intravenous Continuous Bedelia Person, MD 10 mL/hr at 03/08/11 0700     Medications Prior to Admission  Medication Sig Dispense Refill  . atenolol (TENORMIN) 50 MG tablet Take 50 mg by mouth 2 (two) times daily.       . Cholecalciferol (VITAMIN D-3 PO) Take 1 tablet by mouth as directed. Takes on Monday, Saturday      . Cyanocobalamin (VITAMIN B-12 IJ) Inject as directed.       . digoxin (LANOXIN) 0.25 MG tablet Take 250 mcg by mouth daily.        .  DULoxetine (CYMBALTA) 60 MG capsule Take 60 mg by mouth daily.        . furosemide (LASIX) 20 MG tablet Take 20 mg by mouth 2 (two) times daily.        Marland Kitchen POTASSIUM CHLORIDE PO Take 1 tablet by mouth daily.          Results for orders placed during the hospital encounter of 03/08/11 (from the past 48 hour(s))  BASIC METABOLIC PANEL     Status: Abnormal   Collection Time   03/07/11  3:20 PM      Component Value Range Comment   Sodium 141  135 - 145 (mEq/L)    Potassium 4.1  3.5 - 5.1 (mEq/L)    Chloride 105  96 - 112 (mEq/L)    CO2 26  19 - 32 (mEq/L)    Glucose, Bld 126 (*) 70 - 99 (mg/dL)    BUN 21  6 - 23 (mg/dL)    Creatinine, Ser 3.08  0.50 - 1.10 (mg/dL)    Calcium 9.6  8.4 - 10.5 (mg/dL)    GFR calc non Af Amer 68 (*) >90 (mL/min)    GFR calc Af Amer 79 (*) >90 (mL/min)   POCT HEMOGLOBIN-HEMACUE  Status: Normal   Collection Time   03/08/11  7:04 AM      Component Value Range Comment   Hemoglobin 13.3  12.0 - 15.0 (g/dL)     No results found.   A comprehensive review of systems was negative except for: Eyes: positive for contacts/glasses Ears, nose, mouth, throat, and face: positive for hoarseness and tinnitus Respiratory: positive for asthma Gastrointestinal: positive for blood in stool Integument/breast: positive for rash Hematologic/lymphatic: positive for bleeding and easy bruising Neurological: positive for headaches and sleep disorder, blance problems Behavioral/Psych: positive for sleep disturbance  Blood pressure 177/103, pulse 77, temperature 98.4 F (36.9 C), temperature source Oral, resp. rate 18, height 5' 4.5" (1.638 m), weight 77.111 kg (170 lb), SpO2 98.00%.  General appearance: alert, cooperative and appears stated age Head: Normocephalic, without obvious abnormality, atraumatic Neck: supple, symmetrical, trachea midline Resp: clear to auscultation bilaterally Cardio: regular rate and rhythm GI: soft, non-tender; bowel sounds normal; no masses,  no  organomegaly Extremities: intact sensation and capillary refill all digits.  +epl/fpl/io.  left long with erythema and swelling distally.  small wound on dorsum at paronychium.  no tenderness volarly over p1 or p2. Pulses: 2+ and symmetric Skin: as above Neurologic: Grossly normal Incision/Wound: As above  Assessment/Plan Left long finger infected mucoid cyst.  Recommend I&D and excision of cyst and I&D of dip joint in OR.  Risks, benefits, alternatives discussed and patient agrees with plan of care.  Kristin Gomez R 03/08/2011, 7:45 AM

## 2011-03-08 NOTE — Brief Op Note (Signed)
03/08/2011  8:55 AM  PATIENT:  Kristin Gomez  66 y.o. female  PRE-OPERATIVE DIAGNOSIS:  Left Long Finger Mucoid Cyst  POST-OPERATIVE DIAGNOSIS:  Left Long Finger Mucoid Cyst  PROCEDURE:  Procedure(s): EXCISION MASS  SURGEON:  Surgeon(s): Tami Ribas, MD  PHYSICIAN ASSISTANT:   ASSISTANTS: none   ANESTHESIA:   general  EBL:  Total I/O In: 1000 [I.V.:1000] Out: -   BLOOD ADMINISTERED:none  DRAINS: iodoform packing, vessel loop drain  LOCAL MEDICATIONS USED:  MARCAINE 8 CC  SPECIMEN:  Source of Specimen:  left long finger  DISPOSITION OF SPECIMEN:  cultures to micro, cyst to pathology  COUNTS:  YES  TOURNIQUET:   Total Tourniquet Time Documented: Upper Arm (Left) - 30 minutes  DICTATION: .Other Dictation: Dictation Number [id number]  PLAN OF CARE: Discharge to home after PACU  PATIENT DISPOSITION:  PACU - hemodynamically stable.

## 2011-03-08 NOTE — Op Note (Signed)
Kristin Gomez, Kristin Gomez                ACCOUNT NO.:  [id number]  MEDICAL RECORD NO.:  [id number]  LOCATION:                                 FACILITY:  PHYSICIAN:  Betha Loa, MD        DATE OF BIRTH:  1945-11-17  DATE OF PROCEDURE:  03/08/2011 DATE OF DISCHARGE:                              OPERATIVE REPORT   PREOPERATIVE DIAGNOSIS:  Left long finger infected mucoid cyst.  POSTOPERATIVE DIAGNOSIS: Left long finger infected mucoid cyst.  PROCEDURE:  Irrigation and debridement of left long finger, excision of cyst, and debridement of DIP joint.  SURGEON:  Betha Loa, MD.  ASSISTANT:  None.  ANESTHESIA:  General.  IV FLUIDS:  Per Anesthesia flow sheet.  ESTIMATED BLOOD LOSS:  Minimal.  COMPLICATIONS:  None.  SPECIMENS:  Cultures to Micro and cyst to Pathology.  TOURNIQUET TIME:  30 minutes.  DISPOSITION:  Stable to PACU.  INDICATIONS:  Kristin Gomez is a 66 year old female, who 1 week ago popped a cyst on the dorsum of the left long finger proximal to the nail.  In the past 2 or 3 days, this has become swollen, erythematous, and painful.  She presented to the office yesterday and was evaluated.  It was felt this was infected mucoid cyst.  I recommended going to the operating room for irrigation, debridement, and excision of the cyst and debridement of the DIP joint.  Risks, benefits, and alternatives of surgery were discussed including the risk of blood loss; infection; damage to nerves, vessels, tendons, ligaments, bone; failure of surgery; need for additional surgery; complications with wound healing; continued pain; continued infection; need for repeat irrigation and debridement. She voiced understanding of these risks and elected to proceed.  She was started on doxycycline.  OPERATIVE COURSE:  After being identified preoperatively by myself, the patient and I agreed upon the procedure and site procedure.  The surgical site was marked.  The risks, benefits, and  alternatives of surgery were reviewed and she wished to proceed.  Surgical consent had been signed.  She was transferred to the operating room and placed in the operating table in supine position with left upper extremity in arm board.  General anesthesia was induced by the anesthesiologist.  The left upper extremity was prepped and draped in normal sterile orthopedic fashion.  A surgical pause was performed between surgeons, anesthesia and operating room staff, and all were agreement to the patient, procedure, and site of procedure.  Tourniquet to the proximal aspect of the extremity was inflated to 250 mmHg after exsanguination of the forearm with an Esmarch bandage. Hockey stick incision was made with the transverse limb over the DIP joint.  This was carried into the subcutaneous tissues.  The cyst was entered.  There was purulence within it.  This was cultured for aerobes and anaerobes.  The cyst was removed and sent to pathology.  The skin was very thin.  The cyst appeared to track toward the ulnar side of the DIP joint.  This was traced down.  The DIP joint was entered.  There was no gross purulence within it.  It was debrided.  The wounds were all copiously irrigated  with 1000 mL of sterile saline by bulb syringe.  All necrotic tissue and purulence was removed.  Iodoform packing was placed in the wound.  A vessel loop drain was placed onto the ulnar side of the finger in the DIP joint and into the cyst.  A single 4-0 nylon suture was used to tack the skin back down.  The wound was dressed with sterile Xeroform.  A digital block was performed with 8 mL of 0.25% plain Marcaine.  The wound was dressed with a sterile 4 x 4 Kling wrap, Alumafoam splint, and Coban.  This was placed lightly.  The tourniquet was deflated at 30 minutes.  The operative drapes were broken down.  The patient was awoken from anesthesia safely.  She was transferred back to the stretcher and taken to PACU in  stable condition.  I will continue her on doxycycline 100 mg p.o. b.i.d. x7 days.  I will give her Norco 5/325 one to two p.o. q.6 hours p.r.n. pain, dispensed #40.  She was given a dose of vancomycin in the OR after cultures were taken.  I will see her back in the office in 2-3 days.     Betha Loa, MD     KK/MEDQ  D:  03/08/2011  T:  03/08/2011  Job:  [id number]

## 2011-03-08 NOTE — Anesthesia Preprocedure Evaluation (Signed)
Anesthesia Evaluation  Patient identified by MRN, date of birth, ID band Patient awake    Reviewed: Allergy & Precautions, H&P , NPO status , Patient's Chart, lab work & pertinent test results  Airway Mallampati: II  Neck ROM: full    Dental   Pulmonary asthma ,          Cardiovascular hypertension, + pacemaker     Neuro/Psych  Neuromuscular disease    GI/Hepatic   Endo/Other  Diabetes mellitus-  Renal/GU      Musculoskeletal  (+) Arthritis -, Fibromyalgia -  Abdominal   Peds  Hematology   Anesthesia Other Findings   Reproductive/Obstetrics                           Anesthesia Physical Anesthesia Plan  ASA: III  Anesthesia Plan: MAC   Post-op Pain Management:    Induction: Intravenous  Airway Management Planned: Simple Face Mask  Additional Equipment:   Intra-op Plan:   Post-operative Plan:   Informed Consent: I have reviewed the patients History and Physical, chart, labs and discussed the procedure including the risks, benefits and alternatives for the proposed anesthesia with the patient or authorized representative who has indicated his/her understanding and acceptance.     Plan Discussed with: CRNA and Surgeon  Anesthesia Plan Comments:         Anesthesia Quick Evaluation

## 2011-03-08 NOTE — Op Note (Signed)
Dictation [id number]

## 2011-03-09 ENCOUNTER — Encounter (HOSPITAL_BASED_OUTPATIENT_CLINIC_OR_DEPARTMENT_OTHER): Payer: Self-pay | Admitting: Orthopedic Surgery

## 2011-03-13 LAB — ANAEROBIC CULTURE

## 2011-03-30 ENCOUNTER — Encounter (HOSPITAL_BASED_OUTPATIENT_CLINIC_OR_DEPARTMENT_OTHER): Payer: Self-pay | Admitting: *Deleted

## 2011-03-30 ENCOUNTER — Telehealth: Payer: Self-pay | Admitting: *Deleted

## 2011-03-30 ENCOUNTER — Other Ambulatory Visit: Payer: Self-pay | Admitting: Orthopedic Surgery

## 2011-03-30 NOTE — Progress Notes (Signed)
Pt here 1/13 Has pacemaker-got cardilogy notes from [place],and had form faxed as to how to handle device perioperatively. This form was not scanned in access-do have cardiology notes-ok with dr Gypsy Balsam Pt was done under general

## 2011-03-30 NOTE — Progress Notes (Signed)
Pt here 1/13-see all notes Did well

## 2011-03-30 NOTE — Telephone Encounter (Signed)
Kendall from Dr. Merrilee Seashore office called asking to get this lady seen today. Gave info to Print production planner. She will call them

## 2011-03-31 ENCOUNTER — Encounter (HOSPITAL_BASED_OUTPATIENT_CLINIC_OR_DEPARTMENT_OTHER): Payer: Self-pay | Admitting: Anesthesiology

## 2011-03-31 ENCOUNTER — Encounter (HOSPITAL_BASED_OUTPATIENT_CLINIC_OR_DEPARTMENT_OTHER): Payer: Self-pay | Admitting: *Deleted

## 2011-03-31 ENCOUNTER — Encounter (HOSPITAL_BASED_OUTPATIENT_CLINIC_OR_DEPARTMENT_OTHER)
Admission: RE | Disposition: A | Discharge status: Home / Self Care | Payer: Self-pay | Source: Ambulatory Visit | Attending: Orthopedic Surgery

## 2011-03-31 ENCOUNTER — Encounter (HOSPITAL_BASED_OUTPATIENT_CLINIC_OR_DEPARTMENT_OTHER): Payer: Self-pay | Admitting: Orthopedic Surgery

## 2011-03-31 ENCOUNTER — Ambulatory Visit (HOSPITAL_BASED_OUTPATIENT_CLINIC_OR_DEPARTMENT_OTHER): Payer: Medicare Other | Admitting: Anesthesiology

## 2011-03-31 ENCOUNTER — Ambulatory Visit: Payer: Medicare Other | Admitting: Internal Medicine

## 2011-03-31 ENCOUNTER — Ambulatory Visit (HOSPITAL_BASED_OUTPATIENT_CLINIC_OR_DEPARTMENT_OTHER)
Admission: RE | Admit: 2011-03-31 | Discharge: 2011-03-31 | Disposition: A | Discharge status: Home / Self Care | Payer: Medicare Other | Source: Ambulatory Visit | Attending: Orthopedic Surgery | Admitting: Orthopedic Surgery

## 2011-03-31 DIAGNOSIS — D1610 Benign neoplasm of short bones of unspecified upper limb: Secondary | ICD-10-CM | POA: Insufficient documentation

## 2011-03-31 DIAGNOSIS — I509 Heart failure, unspecified: Secondary | ICD-10-CM | POA: Insufficient documentation

## 2011-03-31 DIAGNOSIS — J45909 Unspecified asthma, uncomplicated: Secondary | ICD-10-CM | POA: Insufficient documentation

## 2011-03-31 DIAGNOSIS — A4902 Methicillin resistant Staphylococcus aureus infection, unspecified site: Secondary | ICD-10-CM | POA: Insufficient documentation

## 2011-03-31 DIAGNOSIS — I10 Essential (primary) hypertension: Secondary | ICD-10-CM | POA: Insufficient documentation

## 2011-03-31 DIAGNOSIS — E119 Type 2 diabetes mellitus without complications: Secondary | ICD-10-CM | POA: Insufficient documentation

## 2011-03-31 HISTORY — PX: I & D EXTREMITY: SHX5045

## 2011-03-31 LAB — POCT I-STAT, CHEM 8
BUN: 16 mg/dL (ref 6–23)
Chloride: 110 mEq/L (ref 96–112)
Creatinine, Ser: 0.8 mg/dL (ref 0.50–1.10)
Sodium: 141 mEq/L (ref 135–145)

## 2011-03-31 LAB — GLUCOSE, CAPILLARY: Glucose-Capillary: 113 mg/dL — ABNORMAL HIGH (ref 70–99)

## 2011-03-31 SURGERY — IRRIGATION AND DEBRIDEMENT EXTREMITY
Anesthesia: Choice | Laterality: Left

## 2011-03-31 MED ORDER — VANCOMYCIN HCL 1000 MG IV SOLR
1000.0000 mg | INTRAVENOUS | Status: DC | PRN
  Administered 2011-03-31: 1000 mg via INTRAVENOUS

## 2011-03-31 MED ORDER — HYDROCODONE-ACETAMINOPHEN 5-325 MG PO TABS
1.0000 | ORAL_TABLET | Freq: Once | ORAL | Status: AC
  Administered 2011-03-31: 1 via ORAL

## 2011-03-31 MED ORDER — EPHEDRINE SULFATE 50 MG/ML IJ SOLN
INTRAMUSCULAR | Status: DC | PRN
  Administered 2011-03-31: 15 mg via INTRAVENOUS

## 2011-03-31 MED ORDER — FENTANYL CITRATE 0.05 MG/ML IJ SOLN
INTRAMUSCULAR | Status: DC | PRN
  Administered 2011-03-31: 50 ug via INTRAVENOUS

## 2011-03-31 MED ORDER — BUPIVACAINE HCL (PF) 0.25 % IJ SOLN
INTRAMUSCULAR | Status: DC | PRN
  Administered 2011-03-31: 7 mL

## 2011-03-31 MED ORDER — ACETAMINOPHEN 10 MG/ML IV SOLN
1000.0000 mg | Freq: Once | INTRAVENOUS | Status: AC
Start: 2011-03-31 — End: 2011-03-31
  Administered 2011-03-31: 1000 mg via INTRAVENOUS

## 2011-03-31 MED ORDER — METOCLOPRAMIDE HCL 5 MG/ML IJ SOLN
INTRAMUSCULAR | Status: DC | PRN
  Administered 2011-03-31: 10 mg via INTRAVENOUS

## 2011-03-31 MED ORDER — MIDAZOLAM HCL 2 MG/2ML IJ SOLN: 0.5000 mg | INTRAMUSCULAR | Status: DC | PRN

## 2011-03-31 MED ORDER — CHLORHEXIDINE GLUCONATE 4 % EX LIQD: 60.0000 mL | Freq: Once | CUTANEOUS | Status: DC

## 2011-03-31 MED ORDER — FENTANYL CITRATE 0.05 MG/ML IJ SOLN
25.0000 ug | INTRAMUSCULAR | Status: DC | PRN
  Administered 2011-03-31: 25 ug via INTRAVENOUS

## 2011-03-31 MED ORDER — CLINDAMYCIN HCL 300 MG PO CAPS: 300.0000 mg | ORAL_CAPSULE | Freq: Four times a day (QID) | ORAL | Status: AC

## 2011-03-31 MED ORDER — ETOMIDATE 2 MG/ML IV SOLN
INTRAVENOUS | Status: DC | PRN
  Administered 2011-03-31: 14 mg via INTRAVENOUS

## 2011-03-31 MED ORDER — METOCLOPRAMIDE HCL 5 MG/ML IJ SOLN: 10.0000 mg | Freq: Once | INTRAMUSCULAR | Status: DC | PRN

## 2011-03-31 MED ORDER — LACTATED RINGERS IV SOLN
INTRAVENOUS | Status: DC
  Administered 2011-03-31 (×3): via INTRAVENOUS

## 2011-03-31 MED ORDER — ONDANSETRON HCL 4 MG/2ML IJ SOLN
INTRAMUSCULAR | Status: DC | PRN
  Administered 2011-03-31: 4 mg via INTRAVENOUS

## 2011-03-31 MED ORDER — HYDROCODONE-ACETAMINOPHEN 5-325 MG PO TABS: ORAL_TABLET | ORAL | Status: DC

## 2011-03-31 MED ORDER — MORPHINE SULFATE 2 MG/ML IJ SOLN: 0.0500 mg/kg | INTRAMUSCULAR | Status: DC | PRN

## 2011-03-31 MED ORDER — MIDAZOLAM HCL 5 MG/5ML IJ SOLN
INTRAMUSCULAR | Status: DC | PRN
  Administered 2011-03-31: 2 mg via INTRAVENOUS

## 2011-03-31 SURGICAL SUPPLY — 49 items
BAG DECANTER FOR FLEXI CONT (MISCELLANEOUS) IMPLANT
BANDAGE ELASTIC 3 VELCRO ST LF (GAUZE/BANDAGES/DRESSINGS) IMPLANT
BANDAGE GAUZE ELAST BULKY 4 IN (GAUZE/BANDAGES/DRESSINGS) IMPLANT
BANDAGE GAUZE STRT 1 STR LF (GAUZE/BANDAGES/DRESSINGS) ×1 IMPLANT
BLADE MINI RND TIP GREEN BEAV (BLADE) IMPLANT
BLADE SURG 15 STRL LF DISP TIS (BLADE) ×2 IMPLANT
BLADE SURG 15 STRL SS (BLADE) ×4
BNDG CMPR 9X4 STRL LF SNTH (GAUZE/BANDAGES/DRESSINGS)
BNDG CMPR MD 5X2 ELC HKLP STRL (GAUZE/BANDAGES/DRESSINGS)
BNDG COHESIVE 1X5 TAN STRL LF (GAUZE/BANDAGES/DRESSINGS) ×1 IMPLANT
BNDG ELASTIC 2 VLCR STRL LF (GAUZE/BANDAGES/DRESSINGS) IMPLANT
BNDG ESMARK 4X9 LF (GAUZE/BANDAGES/DRESSINGS) IMPLANT
CHLORAPREP W/TINT 26ML (MISCELLANEOUS) ×2 IMPLANT
CLOTH BEACON ORANGE TIMEOUT ST (SAFETY) ×2 IMPLANT
CORDS BIPOLAR (ELECTRODE) ×2 IMPLANT
COVER MAYO STAND STRL (DRAPES) ×2 IMPLANT
COVER TABLE BACK 60X90 (DRAPES) ×2 IMPLANT
CUFF TOURNIQUET SINGLE 18IN (TOURNIQUET CUFF) ×2 IMPLANT
DRAPE EXTREMITY T 121X128X90 (DRAPE) ×2 IMPLANT
DRAPE SURG 17X23 STRL (DRAPES) ×2 IMPLANT
GAUZE PACKING IODOFORM 1/4X5 (PACKING) ×1 IMPLANT
GAUZE XEROFORM 1X8 LF (GAUZE/BANDAGES/DRESSINGS) ×2 IMPLANT
GLOVE BIO SURGEON STRL SZ7.5 (GLOVE) ×2 IMPLANT
GLOVE BIOGEL PI IND STRL 8 (GLOVE) ×1 IMPLANT
GLOVE BIOGEL PI INDICATOR 8 (GLOVE)
GOWN PREVENTION PLUS XLARGE (GOWN DISPOSABLE) ×2 IMPLANT
GOWN STRL REIN XL XLG (GOWN DISPOSABLE) ×2 IMPLANT
LOOP VESSEL MAXI BLUE (MISCELLANEOUS) ×1 IMPLANT
NDL HYPO 25X1 1.5 SAFETY (NEEDLE) IMPLANT
NEEDLE HYPO 25X1 1.5 SAFETY (NEEDLE) ×2 IMPLANT
NS IRRIG 1000ML POUR BTL (IV SOLUTION) ×2 IMPLANT
PACK BASIN DAY SURGERY FS (CUSTOM PROCEDURE TRAY) ×2 IMPLANT
PAD CAST 3X4 CTTN HI CHSV (CAST SUPPLIES) IMPLANT
PADDING CAST ABS 4INX4YD NS (CAST SUPPLIES) ×1
PADDING CAST ABS COTTON 4X4 ST (CAST SUPPLIES) ×1 IMPLANT
PADDING CAST COTTON 3X4 STRL (CAST SUPPLIES)
SPLINT PLASTER CAST XFAST 3X15 (CAST SUPPLIES) IMPLANT
SPLINT PLASTER XTRA FASTSET 3X (CAST SUPPLIES)
SPONGE GAUZE 4X4 12PLY (GAUZE/BANDAGES/DRESSINGS) ×2 IMPLANT
STOCKINETTE 4X48 STRL (DRAPES) ×2 IMPLANT
SUT ETHILON 4 0 PS 2 18 (SUTURE) IMPLANT
SWAB CULTURE LIQ STUART DBL (MISCELLANEOUS) IMPLANT
SYR BULB 3OZ (MISCELLANEOUS) ×2 IMPLANT
SYR CONTROL 10ML LL (SYRINGE) ×2 IMPLANT
TOWEL OR 17X24 6PK STRL BLUE (TOWEL DISPOSABLE) ×4 IMPLANT
TUBE ANAEROBIC SPECIMEN COL (MISCELLANEOUS) IMPLANT
TUBE FEEDING 5FR 15 INCH (TUBING) IMPLANT
UNDERPAD 30X30 INCONTINENT (UNDERPADS AND DIAPERS) ×2 IMPLANT
WATER STERILE IRR 1000ML POUR (IV SOLUTION) ×2 IMPLANT

## 2011-03-31 NOTE — Brief Op Note (Signed)
03/31/2011  11:27 AM  PATIENT:  Kristin Gomez  66 y.o. female  PRE-OPERATIVE DIAGNOSIS:  left long finger infection  POST-OPERATIVE DIAGNOSIS:  left long finger infection  PROCEDURE:  Procedure(s) (LRB): IRRIGATION AND DEBRIDEMENT EXTREMITY (Left)  SURGEON:  Surgeon(s) and Role:    * Tami Ribas, MD - Primary  PHYSICIAN ASSISTANT:   ASSISTANTS: none   ANESTHESIA:   general  EBL:  Total I/O In: 2000 [I.V.:2000] Out: -   BLOOD ADMINISTERED:none  DRAINS: vessel loop in dip joint, iodoform packing  LOCAL MEDICATIONS USED:  MARCAINE     SPECIMEN:  Source of Specimen:  left long finger  DISPOSITION OF SPECIMEN:  micro  COUNTS:  YES  TOURNIQUET:   Total Tourniquet Time Documented: Forearm (Left) - 26 minutes  DICTATION: .Other Dictation: Dictation Number [id number]  PLAN OF CARE: Discharge to home after PACU  PATIENT DISPOSITION:  PACU - hemodynamically stable.

## 2011-03-31 NOTE — Op Note (Signed)
Dictation [id number]

## 2011-03-31 NOTE — Anesthesia Procedure Notes (Signed)
Procedure Name: LMA Insertion Date/Time: 03/31/2011 10:34 AM Performed by: Jearld Shines Pre-anesthesia Checklist: Patient identified, Emergency Drugs available, Suction available and Patient being monitored Patient Re-evaluated:Patient Re-evaluated prior to inductionOxygen Delivery Method: Circle System Utilized Preoxygenation: Pre-oxygenation with 100% oxygen Intubation Type: IV induction Ventilation: Mask ventilation without difficulty LMA: LMA inserted LMA Size: 3.0 Number of attempts: 1 Airway Equipment and Method: bite block Placement Confirmation: positive ETCO2 and breath sounds checked- equal and bilateral Tube secured with: Tape Dental Injury: Teeth and Oropharynx as per pre-operative assessment  Difficulty Due To: Difficulty was anticipated and Difficult Airway- due to limited oral opening

## 2011-03-31 NOTE — H&P (View-Only) (Signed)
Kristin Gomez is an 65 y.o. female.   Chief Complaint: infected mucoid cyst HPI: 65 yo rhd female with bump on left long finger that she popped last week.  Began to have swelling and erythema three days ago.  No fevers, chills, night sweats.  Pain with palpation.  Past Medical History  Diagnosis Date  . Pacemaker   . Fibromyalgia   . Hypertension   . Heart disease   . Asthma   . Diabetes mellitus     controlled by diet  . Psoriatic arthritis, destructive type     Past Surgical History  Procedure Date  . Pacemaker insertion   . Cataract extraction   . Knee surgery   . Total abdominal hysterectomy   . Cholecystectomy   . Tonsillectomy   . Colonoscopy     Family History  Problem Relation Age of Onset  . Alzheimer's disease Mother   . Heart attack Father    Social History:  reports that she has never smoked. She does not have any smokeless tobacco history on file. She reports that she does not drink alcohol or use illicit drugs.  Allergies:  Allergies  Allergen Reactions  . Azithromycin Anaphylaxis  . Aspirin Other (See Comments)    Patient states that is causes an asthma attack.  . Avelox (Moxifloxacin Hcl In Nacl) Swelling  . Diovan (Valsartan) Swelling  . Remicade (Infliximab) Hives  . Amoxicillin Rash  . Penicillins Rash  . Prednisone Rash  . Propofol Rash  . Sulfa Antibiotics Rash    Medications Prior to Admission  Medication Dose Route Frequency Provider Last Rate Last Dose  . lactated ringers infusion   Intravenous Continuous Lee Kasik, MD 10 mL/hr at 03/08/11 0700     Medications Prior to Admission  Medication Sig Dispense Refill  . atenolol (TENORMIN) 50 MG tablet Take 50 mg by mouth 2 (two) times daily.       . Cholecalciferol (VITAMIN D-3 PO) Take 1 tablet by mouth as directed. Takes on Monday, Saturday      . Cyanocobalamin (VITAMIN B-12 IJ) Inject as directed.       . digoxin (LANOXIN) 0.25 MG tablet Take 250 mcg by mouth daily.        .  DULoxetine (CYMBALTA) 60 MG capsule Take 60 mg by mouth daily.        . furosemide (LASIX) 20 MG tablet Take 20 mg by mouth 2 (two) times daily.        . POTASSIUM CHLORIDE PO Take 1 tablet by mouth daily.          Results for orders placed during the hospital encounter of 03/08/11 (from the past 48 hour(s))  BASIC METABOLIC PANEL     Status: Abnormal   Collection Time   03/07/11  3:20 PM      Component Value Range Comment   Sodium 141  135 - 145 (mEq/L)    Potassium 4.1  3.5 - 5.1 (mEq/L)    Chloride 105  96 - 112 (mEq/L)    CO2 26  19 - 32 (mEq/L)    Glucose, Bld 126 (*) 70 - 99 (mg/dL)    BUN 21  6 - 23 (mg/dL)    Creatinine, Ser 0.87  0.50 - 1.10 (mg/dL)    Calcium 9.6  8.4 - 10.5 (mg/dL)    GFR calc non Af Amer 68 (*) >90 (mL/min)    GFR calc Af Amer 79 (*) >90 (mL/min)   POCT HEMOGLOBIN-HEMACUE       Status: Normal   Collection Time   03/08/11  7:04 AM      Component Value Range Comment   Hemoglobin 13.3  12.0 - 15.0 (g/dL)     No results found.   A comprehensive review of systems was negative except for: Eyes: positive for contacts/glasses Ears, nose, mouth, throat, and face: positive for hoarseness and tinnitus Respiratory: positive for asthma Gastrointestinal: positive for blood in stool Integument/breast: positive for rash Hematologic/lymphatic: positive for bleeding and easy bruising Neurological: positive for headaches and sleep disorder, blance problems Behavioral/Psych: positive for sleep disturbance  Blood pressure 177/103, pulse 77, temperature 98.4 F (36.9 C), temperature source Oral, resp. rate 18, height 5' 4.5" (1.638 m), weight 77.111 kg (170 lb), SpO2 98.00%.  General appearance: alert, cooperative and appears stated age Head: Normocephalic, without obvious abnormality, atraumatic Neck: supple, symmetrical, trachea midline Resp: clear to auscultation bilaterally Cardio: regular rate and rhythm GI: soft, non-tender; bowel sounds normal; no masses,  no  organomegaly Extremities: intact sensation and capillary refill all digits.  +epl/fpl/io.  left long with erythema and swelling distally.  small wound on dorsum at paronychium.  no tenderness volarly over p1 or p2. Pulses: 2+ and symmetric Skin: as above Neurologic: Grossly normal Incision/Wound: As above  Assessment/Plan Left long finger infected mucoid cyst.  Recommend I&D and excision of cyst and I&D of dip joint in OR.  Risks, benefits, alternatives discussed and patient agrees with plan of care.  Jyquan Kenley R 03/08/2011, 7:45 AM    

## 2011-03-31 NOTE — Transfer of Care (Signed)
Immediate Anesthesia Transfer of Care Note  Patient: Kristin Gomez  Procedure(s) Performed: Procedure(s) (LRB): IRRIGATION AND DEBRIDEMENT EXTREMITY (Left)  Patient Location: PACU  Anesthesia Type: General  Level of Consciousness: sedated  Airway & Oxygen Therapy: Patient Spontanous Breathing and Patient connected to face mask oxygen  Post-op Assessment: Report given to PACU RN and Post -op Vital signs reviewed and stable  Post vital signs: Reviewed and stable  Complications: No apparent anesthesia complications

## 2011-03-31 NOTE — Interval H&P Note (Signed)
History and Physical Interval Note:  03/31/2011 10:17 AM  Recurrent swelling, erythema, and pain in left long finger after wound healing.  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  Kristin Gomez  has presented today for surgery, with the diagnosis of left long finger infection  The various methods of treatment have been discussed with the patient and family. After consideration of risks, benefits and other options for treatment, the patient has consented to  Procedure(s) (LRB): IRRIGATION AND DEBRIDEMENT EXTREMITY (Left) as a surgical intervention .  The patients' history has been reviewed, patient examined, no change in status, stable for surgery.  I have reviewed the patients' chart and labs.  Questions were answered to the patient's satisfaction.     Bryley Kovacevic R

## 2011-03-31 NOTE — Discharge Instructions (Addendum)

## 2011-03-31 NOTE — Anesthesia Preprocedure Evaluation (Signed)
Anesthesia Evaluation  Patient identified by MRN, date of birth, ID band Patient awake    Reviewed: Allergy & Precautions, H&P , NPO status , Patient's Chart, lab work & pertinent test results, reviewed documented beta blocker date and time   Airway Mallampati: II TM Distance: >3 FB Neck ROM: full    Dental   Pulmonary asthma ,          Cardiovascular hypertension, On Medications +CHF     Neuro/Psych  Neuromuscular disease Negative Psych ROS   GI/Hepatic negative GI ROS, Neg liver ROS,   Endo/Other  Diabetes mellitus-  Renal/GU negative Renal ROS  Genitourinary negative   Musculoskeletal   Abdominal   Peds  Hematology negative hematology ROS (+)   Anesthesia Other Findings See surgeon's H&P   Reproductive/Obstetrics negative OB ROS                           Anesthesia Physical Anesthesia Plan  ASA: III  Anesthesia Plan: General   Post-op Pain Management:    Induction: Intravenous  Airway Management Planned: LMA  Additional Equipment:   Intra-op Plan:   Post-operative Plan: Extubation in OR  Informed Consent: I have reviewed the patients History and Physical, chart, labs and discussed the procedure including the risks, benefits and alternatives for the proposed anesthesia with the patient or authorized representative who has indicated his/her understanding and acceptance.     Plan Discussed with: CRNA and Surgeon  Anesthesia Plan Comments:         Anesthesia Quick Evaluation

## 2011-03-31 NOTE — Anesthesia Postprocedure Evaluation (Signed)
  Anesthesia Post-op Note  Patient: Kristin Gomez  Procedure(s) Performed: Procedure(s) (LRB): IRRIGATION AND DEBRIDEMENT EXTREMITY (Left)  Patient Location: PACU  Anesthesia Type: General  Level of Consciousness: awake  Airway and Oxygen Therapy: Patient Spontanous Breathing  Post-op Pain: mild  Post-op Assessment: Post-op Vital signs reviewed  Post-op Vital Signs: stable  Complications: No apparent anesthesia complications

## 2011-04-01 NOTE — Op Note (Signed)
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  PROCEDURE:  Left long finger irrigation, debridement of subcutaneous tissues and distal interphalangeal joint.  SURGEON:  Betha Loa, MD  ASSISTANT:  None.  ANESTHESIA:  General.  IV FLUIDS:  Per anesthesia flow sheet.  ESTIMATED BLOOD LOSS:  Minimal.  COMPLICATIONS:  None.  SPECIMENS:  Cultures to micro.  TOURNIQUET TIME:  26 minutes.  DISPOSITION:  Stable to PACU.  INDICATIONS:  Kristin Gomez is a 66 year old female who had irrigation and debridement of the left long finger infected mucoid cyst and DIP joint approximately 3 weeks ago.  Her cultures grew out Staph resistant to doxycycline.  She has had healing of her wound, but recurrent swelling, erythema, and pain of the long finger.  It was decided that it was appropriate to return to the operating room for repeat irrigation and debridement of potentially reaccumulated infection.  Risks, benefits, alternatives of surgery were discussed including risk of blood loss, infection, damage to nerves, vessels, tendons, ligaments, bone, failure of surgery, need for additional surgery, complications with wound healing, continued pain, continued infection, need for repeat irrigation and debridement.  She voiced understanding of these risks and elected to proceed.  OPERATIVE COURSE:  After being identified preoperatively by myself, the patient and I agreed upon procedure and site of procedure.   Surgical site was marked.  The risks, benefits, and alternatives of surgery were reviewed and she wished to proceed.  Surgical consent had been signed. Preoperative antibiotics were held for cultures.  She was transported to the operating room, placed on the operating room table in supine position with left upper extremity on arm board.  General anesthesia was induced by the anesthesiologist.  Left upper extremity was prepped and draped in normal sterile orthopedic fashion.  Surgical pause performed between surgeons, anesthesia, and operating staff, and all were in agreement as to the patient, procedure, and site of procedure. Tourniquet at the proximal aspect of the forearm was inflated to 250 mmHg after exsanguination of the forearm with an Esmarch bandage.  The previous incision was reused.  It had healed.  This was carried into subcutaneous tissues by spreading technique.  There was no gross purulence.  The area over the extensor tendon was freed up.  There was a small amount of cloudy fluid in this area.  This was cultured for aerobes and anaerobes.  The area underneath the tendon was exposed and the DIP joint entered.  There was no gross purulence within the joint. There was some fluid, however.  Cultures for both aerobes, anaerobes were taken in this area as well.  The wound was  copiously irrigated with 1000 mL of sterile saline.  Angiocath needle was used to irrigate the DIP joint and the paronychial tissue.  The wound was then packed with quarter-inch iodoform gauze.  Two vessel loop drains were placed at the DIP joint.  The wound was injected with 7 mL of 0.25% plain Marcaine to aid in postoperative analgesia.  The wound was dressed with sterile Xeroform, 4x4s, and wrapped with Kling and a Coban dressing lightly. Tourniquet was deflated at 26 minutes.  The operative drapes were broken down.  The patient was awoken from anesthesia safely.  She was transferred back to stretcher  and taken to PACU in stable condition.  I will see her in the office in 4 days for followup and to initiate hydrotherapy with packing changes.  We will have her take clindamycin as she has multiple drug allergies and her last cultures were resistant to doxycycline.  I will give her Percocet 5/325 one to two p.o. q.6 h. p.r.n. pain, dispensed #40.     Betha Loa, MD     KK/MEDQ  D:  03/31/2011  T:  04/01/2011  Job:  [id number]

## 2011-04-03 LAB — CULTURE, ROUTINE-ABSCESS: Gram Stain: NONE SEEN

## 2011-04-04 ENCOUNTER — Ambulatory Visit (INDEPENDENT_AMBULATORY_CARE_PROVIDER_SITE_OTHER): Payer: Medicare Other | Admitting: Internal Medicine

## 2011-04-04 ENCOUNTER — Encounter (HOSPITAL_BASED_OUTPATIENT_CLINIC_OR_DEPARTMENT_OTHER): Payer: Self-pay | Admitting: Orthopedic Surgery

## 2011-04-04 DIAGNOSIS — M6740 Ganglion, unspecified site: Secondary | ICD-10-CM | POA: Insufficient documentation

## 2011-04-04 LAB — CULTURE, ROUTINE-ABSCESS: Gram Stain: NONE SEEN

## 2011-04-04 NOTE — Assessment & Plan Note (Addendum)
The culture has grown out MSSA and is resistant to tetracyclines only.  It is Clinda sensitive so I think that is a good choice.  She does have a rash now but is temporally more related to the metformin than clindamycin and she did take clindamycin previously without a problem.  I have instructed her to stop the metformin, which is related to sulfa and most likely the cause of the rash, and to continue the clindamycin 4 times a day (it is much less effective less than 3 times a day).  I have instructed her that if the rash significantly spreads, to contact me and I may consider a cephalosporin instead, but I do expect it to spread a little more before improving over the next 2-3 days.  She is going to try a topical steroid cream and Benadryl.  If there are any concerns, she knows to contact me.     She is to see her PCP regarding new diabetes treatment and should avoid metformin or glipizide due to allergy.    She will return PRN.

## 2011-04-04 NOTE — Progress Notes (Signed)
  Subjective:    Patient ID: Kristin Gomez, female    DOB: 11/21/1945, 66 y.o.   MRN: [id number]  HPI patient is here for evaluation regarding an infected mucoid cyst s/p I and D in the face of multiple allergies to antibiotics.  The cyst developed and became infected requiring surgery in January and the patient at that time was placed on clindamycin, though she reports that she was taking it once a day.  The infection then reoccured and she underwent I and D again on 2/14.  She is now on Clindamycin twice a day.     In regards to her allergies, she is allergic to multiple different ones.  Azithromycin and Avelox cause throat swelling, though she did not require hospitalization, it resolved with Benadryl; penicillins and sulfa cause rash which is described as a papilar erythematous rash.     She currently has a rash that is temporally related to starting metformin.      Review of Systems  Constitutional: Negative for fever, chills, fatigue and unexpected weight change.  Respiratory: Negative for cough, chest tightness, shortness of breath and stridor.   Cardiovascular: Negative for chest pain, palpitations and leg swelling.  Musculoskeletal: Negative for myalgias and arthralgias.  Skin: Positive for rash. Negative for pallor.  Psychiatric/Behavioral: Negative for dysphoric mood. The patient is not nervous/anxious.        Objective:   Physical Exam  Constitutional: She appears well-developed and well-nourished. No distress.  HENT:  Mouth/Throat: Oropharynx is clear and moist. No oropharyngeal exudate.  Cardiovascular: Normal rate, regular rhythm and normal heart sounds.  Exam reveals no gallop and no friction rub.   No murmur heard. Pulmonary/Chest: Effort normal and breath sounds normal. No respiratory distress. She has no wheezes. She has no rales.  Skin: Skin is warm and dry. Rash noted.       + macular papular rash on abdomen, chest, back.  Pruritic.  Psychiatric: She has a normal  mood and affect. Her behavior is normal.          Assessment & Plan:

## 2011-04-05 LAB — ANAEROBIC CULTURE

## 2012-09-30 ENCOUNTER — Emergency Department (HOSPITAL_BASED_OUTPATIENT_CLINIC_OR_DEPARTMENT_OTHER)
Admission: EM | Admit: 2012-09-30 | Discharge: 2012-09-30 | Disposition: A | Discharge status: Home / Self Care | Payer: Medicare Other | Attending: Emergency Medicine | Admitting: Emergency Medicine

## 2012-09-30 ENCOUNTER — Encounter (HOSPITAL_BASED_OUTPATIENT_CLINIC_OR_DEPARTMENT_OTHER): Payer: Self-pay

## 2012-09-30 ENCOUNTER — Emergency Department (HOSPITAL_BASED_OUTPATIENT_CLINIC_OR_DEPARTMENT_OTHER): Payer: Medicare Other

## 2012-09-30 DIAGNOSIS — Z79899 Other long term (current) drug therapy: Secondary | ICD-10-CM | POA: Insufficient documentation

## 2012-09-30 DIAGNOSIS — Z794 Long term (current) use of insulin: Secondary | ICD-10-CM | POA: Insufficient documentation

## 2012-09-30 DIAGNOSIS — L408 Other psoriasis: Secondary | ICD-10-CM | POA: Insufficient documentation

## 2012-09-30 DIAGNOSIS — Z880 Allergy status to penicillin: Secondary | ICD-10-CM | POA: Insufficient documentation

## 2012-09-30 DIAGNOSIS — Z950 Presence of cardiac pacemaker: Secondary | ICD-10-CM | POA: Insufficient documentation

## 2012-09-30 DIAGNOSIS — R1032 Left lower quadrant pain: Secondary | ICD-10-CM | POA: Insufficient documentation

## 2012-09-30 DIAGNOSIS — Z8679 Personal history of other diseases of the circulatory system: Secondary | ICD-10-CM | POA: Insufficient documentation

## 2012-09-30 DIAGNOSIS — I10 Essential (primary) hypertension: Secondary | ICD-10-CM | POA: Insufficient documentation

## 2012-09-30 DIAGNOSIS — R5381 Other malaise: Secondary | ICD-10-CM | POA: Insufficient documentation

## 2012-09-30 DIAGNOSIS — K529 Noninfective gastroenteritis and colitis, unspecified: Secondary | ICD-10-CM

## 2012-09-30 DIAGNOSIS — Z872 Personal history of diseases of the skin and subcutaneous tissue: Secondary | ICD-10-CM | POA: Insufficient documentation

## 2012-09-30 DIAGNOSIS — R21 Rash and other nonspecific skin eruption: Secondary | ICD-10-CM | POA: Insufficient documentation

## 2012-09-30 DIAGNOSIS — IMO0001 Reserved for inherently not codable concepts without codable children: Secondary | ICD-10-CM | POA: Insufficient documentation

## 2012-09-30 DIAGNOSIS — R5383 Other fatigue: Secondary | ICD-10-CM | POA: Insufficient documentation

## 2012-09-30 DIAGNOSIS — E119 Type 2 diabetes mellitus without complications: Secondary | ICD-10-CM | POA: Insufficient documentation

## 2012-09-30 DIAGNOSIS — J45909 Unspecified asthma, uncomplicated: Secondary | ICD-10-CM | POA: Insufficient documentation

## 2012-09-30 DIAGNOSIS — K5289 Other specified noninfective gastroenteritis and colitis: Secondary | ICD-10-CM | POA: Insufficient documentation

## 2012-09-30 DIAGNOSIS — R197 Diarrhea, unspecified: Secondary | ICD-10-CM | POA: Insufficient documentation

## 2012-09-30 LAB — COMPREHENSIVE METABOLIC PANEL
ALT: 17 U/L (ref 0–35)
AST: 19 U/L (ref 0–37)
Albumin: 3.3 g/dL — ABNORMAL LOW (ref 3.5–5.2)
Alkaline Phosphatase: 80 U/L (ref 39–117)
Glucose, Bld: 167 mg/dL — ABNORMAL HIGH (ref 70–99)
Potassium: 3.9 mEq/L (ref 3.5–5.1)
Sodium: 140 mEq/L (ref 135–145)
Total Protein: 6.2 g/dL (ref 6.0–8.3)

## 2012-09-30 LAB — POCT I-STAT 3, VENOUS BLOOD GAS (G3P V)
Acid-base deficit: 1 mmol/L (ref 0.0–2.0)
Bicarbonate: 24.9 mEq/L — ABNORMAL HIGH (ref 20.0–24.0)
O2 Saturation: 54 %
TCO2: 26 mmol/L (ref 0–100)
pO2, Ven: 29 mmHg — CL (ref 30.0–45.0)

## 2012-09-30 LAB — CBC WITH DIFFERENTIAL/PLATELET
Basophils Absolute: 0.1 10*3/uL (ref 0.0–0.1)
Basophils Relative: 1 % (ref 0–1)
Eosinophils Absolute: 0.2 10*3/uL (ref 0.0–0.7)
Lymphs Abs: 2.3 10*3/uL (ref 0.7–4.0)
MCH: 32.4 pg (ref 26.0–34.0)
Neutrophils Relative %: 63 % (ref 43–77)
Platelets: 200 10*3/uL (ref 150–400)
RBC: 4.57 MIL/uL (ref 3.87–5.11)
WBC: 9.3 10*3/uL (ref 4.0–10.5)

## 2012-09-30 LAB — URINE MICROSCOPIC-ADD ON

## 2012-09-30 LAB — URINALYSIS, ROUTINE W REFLEX MICROSCOPIC
Bilirubin Urine: NEGATIVE
Glucose, UA: NEGATIVE mg/dL
Ketones, ur: NEGATIVE mg/dL
pH: 5 (ref 5.0–8.0)

## 2012-09-30 LAB — CG4 I-STAT (LACTIC ACID): Lactic Acid, Venous: 1.61 mmol/L (ref 0.5–2.2)

## 2012-09-30 MED ORDER — IOHEXOL 300 MG/ML  SOLN
50.0000 mL | Freq: Once | INTRAMUSCULAR | Status: AC | PRN
  Administered 2012-09-30: 50 mL via ORAL

## 2012-09-30 MED ORDER — ONDANSETRON HCL 4 MG/2ML IJ SOLN
4.0000 mg | Freq: Once | INTRAMUSCULAR | Status: AC
  Administered 2012-09-30: 4 mg via INTRAVENOUS
  Filled 2012-09-30: qty 2

## 2012-09-30 MED ORDER — SODIUM CHLORIDE 0.9 % IV BOLUS (SEPSIS)
1000.0000 mL | Freq: Once | INTRAVENOUS | Status: AC
  Administered 2012-09-30: 1000 mL via INTRAVENOUS

## 2012-09-30 MED ORDER — HYDROMORPHONE HCL PF 1 MG/ML IJ SOLN
1.0000 mg | Freq: Once | INTRAMUSCULAR | Status: AC
  Administered 2012-09-30: 1 mg via INTRAVENOUS
  Filled 2012-09-30: qty 1

## 2012-09-30 MED ORDER — ONDANSETRON HCL 4 MG PO TABS: 4.0000 mg | ORAL_TABLET | Freq: Three times a day (TID) | ORAL | Status: DC | PRN

## 2012-09-30 MED ORDER — IOHEXOL 300 MG/ML  SOLN
100.0000 mL | Freq: Once | INTRAMUSCULAR | Status: AC | PRN
  Administered 2012-09-30: 80 mL via INTRAVENOUS

## 2012-09-30 NOTE — ED Notes (Signed)
HOB elevated, pt given crackers and graham crackers.

## 2012-09-30 NOTE — ED Notes (Signed)
Patient here with abdominal cramping, vomiting and diarrhea since Wednesday. Reports that the vomiting has stopped but now extremely weak. Also complains of backpain

## 2012-09-30 NOTE — ED Notes (Signed)
MD back at bedside. Patient complains of feeling restless and anxious. Additional NS liter up and vitals remain stable. Family informed of plan and remain at bedside.

## 2012-09-30 NOTE — ED Notes (Signed)
Reassessment for pt, appears anxious moving legs intermittently but no longer shaking entire body as previously.  Reports feels 'hot' and pain (chronic) in right shoulder is present.  Again tolerating ice chips without difficulty.

## 2012-09-30 NOTE — ED Notes (Signed)
Pt tolerating po challenge at this time, no emesis.

## 2012-09-30 NOTE — ED Notes (Signed)
Dr. Bernette Mayers requested I get patient food/drink, nurse Asher Muir stated that was canceled as patient could not handle meds well.

## 2012-09-30 NOTE — ED Notes (Signed)
MD at bedside. 

## 2012-09-30 NOTE — ED Provider Notes (Signed)
CSN: [id number]     Arrival date & time 09/30/12  1751 History  This chart was scribed for Zera Markwardt B. Bernette Mayers, MD by Ardelia Mems, ED Scribe. This patient was seen in room MH04/MH04 and the patient's care was started at 6:58 PM.    Chief Complaint  Patient presents with  . Emesis    The history is provided by the patient. No language interpreter was used.    HPI Comments: Kristin Gomez is a 67 y.o. female with a history of DM who presents to the Emergency Department complaining of "cramping" abdominal pain over the past 4 days, with associated nausea, vomiting and diarrhea. She states that the abdominal pain is worst in the LLQ and that it radiates to the rest of her abdomen and to her back. She reports that she is nauseous currently, but that her vomiting seems to be subsiding. She also complains of generalized weakness. She states that she had a fever of 99.3 F last night, which subsided. ED temperature is 97.2 F. She states that she was started on a Lisinopril about 3 weeks ago, and she suspects that this may be related to her current symptoms. She denies any recent sick contacts. She reports having an upper GI recently, which found a hiatal hernia. She states that her last lower GI was about 5 years ago, which found polyps and diverticulosis. She also is complaining of an itchy psoriasis rash which she states is the worst she has ever had, and she believes this may be related to her recent medication change. She denies chest pain, SOB, neck pain or any other symptoms. She denies any history of smoking and denies alcohol use.    Past Medical History  Diagnosis Date  . Pacemaker   . Fibromyalgia   . Hypertension   . Heart disease   . Asthma   . Diabetes mellitus     controlled by diet  . Psoriatic arthritis, destructive type    Past Surgical History  Procedure Laterality Date  . Pacemaker insertion    . Cataract extraction    . Knee surgery    . Total abdominal hysterectomy     . Cholecystectomy    . Tonsillectomy    . Colonoscopy    . Mass excision  03/08/2011    Procedure: EXCISION MASS;  Surgeon: Tami Ribas, MD;  Location: [facility];  Service: Orthopedics;  Laterality: Left;  Excision Mass Left Long Finger and Debridement of Distal Interphalangeal  Joint  . I&d extremity  1/13    lt long finger  . I&d extremity  03/31/2011    Procedure: IRRIGATION AND DEBRIDEMENT EXTREMITY;  Surgeon: Tami Ribas, MD;  Location: [facility];  Service: Orthopedics;  Laterality: Left;  left long finger   Family History  Problem Relation Age of Onset  . Alzheimer's disease Mother   . Heart attack Father    History  Substance Use Topics  . Smoking status: Never Smoker   . Smokeless tobacco: Not on file  . Alcohol Use: No   OB History   Grav Para Term Preterm Abortions TAB SAB Ect Mult Living   2 2 2       2      Review of Systems A complete 10 system review of systems was obtained and all systems are negative except as noted in the HPI and PMH.   Allergies  Azithromycin; Aspirin; Avelox; Diovan; Remicade; Amoxicillin; Penicillins; Percocet; Prednisone; Propofol; Sulfa antibiotics; and  Vicodin  Home Medications   Current Outpatient Rx  Name  Route  Sig  Dispense  Refill  . insulin glargine (LANTUS) 100 UNIT/ML injection   Subcutaneous   Inject into the skin at bedtime.         . lansoprazole (PREVACID) 30 MG capsule   Oral   Take 30 mg by mouth daily.         Marland Kitchen lisinopril (PRINIVIL,ZESTRIL) 10 MG tablet   Oral   Take 10 mg by mouth daily.         Marland Kitchen atenolol (TENORMIN) 50 MG tablet   Oral   Take 50 mg by mouth 2 (two) times daily.          . digoxin (LANOXIN) 0.25 MG tablet   Oral   Take 250 mcg by mouth daily.           . DULoxetine (CYMBALTA) 60 MG capsule   Oral   Take 60 mg by mouth daily.           . furosemide (LASIX) 20 MG tablet   Oral   Take 20 mg by mouth 2 (two) times daily.           Marland Kitchen  HYDROcodone-acetaminophen (NORCO) 5-325 MG per tablet      1-2 tabs po q6 hours prn pain   30 tablet   0   . loratadine (CLARITIN) 10 MG tablet   Oral   Take 10 mg by mouth daily.         Marland Kitchen POTASSIUM CHLORIDE PO   Oral   Take 1 tablet by mouth daily.            Triage Vitals: BP 112/64  Pulse 81  Temp(Src) 97.2 F (36.2 C) (Oral)  Resp 18  Wt 160 lb (72.576 kg)  BMI 26.63 kg/m2  SpO2 98%  Physical Exam  Nursing note and vitals reviewed. Constitutional: She is oriented to person, place, and time. She appears well-developed and well-nourished.  HENT:  Head: Normocephalic and atraumatic.  Eyes: EOM are normal. Pupils are equal, round, and reactive to light.  Neck: Normal range of motion. Neck supple.  Cardiovascular: Normal rate, normal heart sounds and intact distal pulses.   Pulmonary/Chest: Effort normal and breath sounds normal.  Abdominal: Bowel sounds are normal. She exhibits no distension. There is tenderness (LLQ tenderness). There is guarding.  Musculoskeletal: Normal range of motion. She exhibits no edema and no tenderness.  Neurological: She is alert and oriented to person, place, and time. She has normal strength. No cranial nerve deficit or sensory deficit.  Skin: Skin is warm and dry. No rash noted.  Psychiatric: She has a normal mood and affect.    ED Course   Medications  sodium chloride 0.9 % bolus 1,000 mL (1,000 mL Intravenous New Bag/Given 09/30/12 2155)  HYDROmorphone (DILAUDID) injection 1 mg (1 mg Intravenous Given 09/30/12 1929)  ondansetron (ZOFRAN) injection 4 mg (4 mg Intravenous Given 09/30/12 1929)  sodium chloride 0.9 % bolus 1,000 mL (0 mL Intravenous Stopped 09/30/12 2054)  iohexol (OMNIPAQUE) 300 MG/ML solution 50 mL (50 mL Oral Contrast Given 09/30/12 1937)  iohexol (OMNIPAQUE) 300 MG/ML solution 100 mL (80 mL Intravenous Contrast Given 09/30/12 2036)  HYDROmorphone (DILAUDID) injection 1 mg (1 mg Intravenous Given 09/30/12 2117)   ondansetron (ZOFRAN) injection 4 mg (4 mg Intravenous Given 09/30/12 2117)   Procedures (including critical care time)  DIAGNOSTIC STUDIES: Oxygen Saturation is 98% on RA, normal by my interpretation.  COORDINATION OF CARE: 7:10 PM- Pt advised of plan for diagnostic lab work and radiology, along with plan to receive medications in the ED and pt agrees.  Labs Reviewed  URINALYSIS, ROUTINE W REFLEX MICROSCOPIC - Abnormal; Notable for the following:    Leukocytes, UA SMALL (*)    All other components within normal limits  URINE MICROSCOPIC-ADD ON - Abnormal; Notable for the following:    Casts HYALINE CASTS (*)    All other components within normal limits  COMPREHENSIVE METABOLIC PANEL - Abnormal; Notable for the following:    Glucose, Bld 167 (*)    Creatinine, Ser 1.40 (*)    Albumin 3.3 (*)    GFR calc non Af Amer 38 (*)    GFR calc Af Amer 44 (*)    All other components within normal limits  POCT I-STAT 3, BLOOD GAS (G3P V) - Abnormal; Notable for the following:    pH, Ven 7.342 (*)    pO2, Ven 29.0 (*)    Bicarbonate 24.9 (*)    All other components within normal limits  CBC WITH DIFFERENTIAL  LIPASE, BLOOD  CG4 I-STAT (LACTIC ACID)   Ct Abdomen Pelvis W Contrast  09/30/2012   *RADIOLOGY REPORT*  Clinical Data: Left lower quadrant pain, vomiting, fever.  CT ABDOMEN AND PELVIS WITH CONTRAST  Technique:  Multidetector CT imaging of the abdomen and pelvis was performed following the standard protocol during bolus administration of intravenous contrast.  Contrast: 80mL OMNIPAQUE IOHEXOL 300 MG/ML  SOLN  Comparison: 08/06/2012  Findings: Coronary artery calcifications.  Heart is normal size. Pacer wires noted within the heart.  Dependent bibasilar atelectasis or scarring.  No effusions.  Prior cholecystectomy.  Liver, spleen, pancreas, adrenals have an unremarkable unenhanced appearance.  Punctate nonobstructing stone in the left kidney.  No ureteral stones or hydronephrosis.   Urinary bladder is decompressed.  Prior hysterectomy.  No adnexal masses.  Large and small bowel are unremarkable.  No free fluid, free air or adenopathy.  Aorta and iliac vessels are nonaneurysmal.  No acute bony abnormality.  IMPRESSION: Coronary artery calcifications.  No acute findings in the abdomen or pelvis.  Punctate nonobstructing left renal stone.   Original Report Authenticated By: Charlett Nose, M.D.    1. Gastroenteritis     MDM  Labs and imaging reviewed and unremarkable. Pt feeling better, but a little dizzy after second dose of Dilaudid. She is able to tolerate PO fluids, but woozy with sitting up. Given a second liter of fluid and will work towards mobilization. No signs of diverticulitis on CT. Likely a viral GI infection. Plan for discharge with able to get up and walk.         I personally performed the services described in this documentation, which was scribed in my presence. The recorded information has been reviewed and is accurate.     Pachia Strum B. Bernette Mayers, MD 09/30/12 2314

## 2012-09-30 NOTE — ED Notes (Signed)
Pt requesting to go to restroom, moving around in bed without difficulty.  When pt sat up, reported unable to walk b/c feels 'too nauseated' to move.  Burped and retched a few times and then refused to sit back up.  Requested bedpan, placed pt on bedpan and emptied.

## 2012-09-30 NOTE — ED Notes (Signed)
Diet soda offered per md.

## 2013-12-16 ENCOUNTER — Encounter (HOSPITAL_BASED_OUTPATIENT_CLINIC_OR_DEPARTMENT_OTHER): Payer: Self-pay

## 2014-07-02 ENCOUNTER — Other Ambulatory Visit (HOSPITAL_COMMUNITY): Payer: Self-pay | Admitting: *Deleted

## 2014-07-03 ENCOUNTER — Inpatient Hospital Stay (HOSPITAL_COMMUNITY)
Admission: RE | Admit: 2014-07-03 | Discharge: 2014-07-03 | Disposition: A | Discharge status: Home / Self Care | Payer: Medicare Other | Source: Ambulatory Visit | Attending: Internal Medicine | Admitting: Internal Medicine

## 2016-12-22 DIAGNOSIS — G4733 Obstructive sleep apnea (adult) (pediatric): Secondary | ICD-10-CM | POA: Insufficient documentation

## 2017-02-14 DIAGNOSIS — I639 Cerebral infarction, unspecified: Secondary | ICD-10-CM

## 2017-02-14 HISTORY — DX: Cerebral infarction, unspecified: I63.9

## 2017-04-21 DIAGNOSIS — R0602 Shortness of breath: Secondary | ICD-10-CM | POA: Insufficient documentation

## 2017-07-26 ENCOUNTER — Encounter: Payer: Self-pay | Admitting: Neurology

## 2017-07-26 ENCOUNTER — Ambulatory Visit: Payer: Medicare Other | Admitting: Neurology

## 2017-07-26 DIAGNOSIS — R413 Other amnesia: Secondary | ICD-10-CM

## 2017-07-26 HISTORY — DX: Other amnesia: R41.3

## 2017-07-26 MED ORDER — DONEPEZIL HCL 5 MG PO TABS: 5.0000 mg | ORAL_TABLET | Freq: Every day | ORAL | 1 refills | Status: DC

## 2017-07-26 NOTE — Progress Notes (Signed)
Reason for visit: Memory disturbance  Referring physician: Dr. Valeta Harms is a 72 y.o. female  History of present illness:  Kristin Gomez is a 72 year old right-handed white female with a history of some difficulty with memory that dates back several years, her daughter indicates that she has had some mild forgetfulness since 2000, but Kristin memory has gotten much worse over Kristin last year.  In January 2019, Kristin Gomez had a sudden decline in her memory, they were concerned about a stroke.  Kristin Gomez underwent a CT scan of Kristin brain that was done at [facility].  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  Some atrophy of Kristin frontal lobes was noted.  Kristin Gomez is unable to have MRI secondary to a pacemaker.  Kristin Gomez has undergone a 2D echocardiogram and a carotid Doppler study, according Kristin family Kristin studies were unremarkable.  For reasons that are unclear to me, Kristin Gomez was placed on Eliquis, she is allergic to aspirin, but she was not on Plavix at Kristin time of Kristin event in January.  Kristin family indicates that a heart rhythm abnormality such as atrial fibrillation or atrial flutter was never documented.  Kristin Gomez currently is not driving a motor vehicle.  She is getting assistance with keeping up with medications and appointments.  Her daughter writes Kristin checks and pays Kristin bills.  Kristin Gomez sleeps well at night but she also reports a lot of fatigue during Kristin day.  She had a sleep study 5 or 6 years ago that was unremarkable.  Kristin Gomez has a vitamin B12 deficiency, she has been on B12 injections.  She apparently did have a headache around Kristin time of Kristin onset of Kristin memory change in January 2019, but she does not have regular headaches.  She comes to this office for an evaluation.  Kristin Gomez reports no numbness or weakness of Kristin face, arms, or legs.  She denies any issues controlling  Kristin bowels or Kristin bladder or any changes in balance.  Past Medical History:  Diagnosis Date  . Asthma   . Diabetes mellitus    controlled by diet  . Fibromyalgia   . Heart disease   . Hypertension   . Memory difficulty 07/26/2017  . Pacemaker   . Psoriatic arthritis, destructive type [facility])     Past Surgical History:  Procedure Laterality Date  . CATARACT EXTRACTION    . CHOLECYSTECTOMY    . COLONOSCOPY    . I&D EXTREMITY  1/13   lt long finger  . I&D EXTREMITY  03/31/2011   Procedure: IRRIGATION AND DEBRIDEMENT EXTREMITY;  Surgeon: Tennis Must, MD;  Location: [place];  Service: Orthopedics;  Laterality: Left;  left long finger  . KNEE SURGERY    . MASS EXCISION  03/08/2011   Procedure: EXCISION MASS;  Surgeon: Tennis Must, MD;  Location: [place];  Service: Orthopedics;  Laterality: Left;  Excision Mass Left Long Finger and Debridement of Distal Interphalangeal  Joint  . PACEMAKER INSERTION    . TONSILLECTOMY    . TOTAL ABDOMINAL HYSTERECTOMY      Family History  Problem Relation Age of Onset  . Alzheimer's disease Mother   . Heart attack Father   . Diabetes Brother     Social history:  reports that she has never smoked. She has never used smokeless  tobacco. She reports that she does not drink alcohol or use drugs.  Medications:  Prior to Admission medications   Medication Sig Start Date End Date Taking? Authorizing Provider  apixaban (ELIQUIS) 2.5 MG TABS tablet Take 2.5 mg by mouth 2 (two) times daily.   Yes [provider]  atenolol (TENORMIN) 50 MG tablet Take 50 mg by mouth 2 (two) times daily.    Yes [provider]  Cholecalciferol (VITAMIN D3 PO) Take 2 tablets by mouth daily. 48mg   Yes [provider]  Cyanocobalamin (B-12 COMPLIANCE INJECTION) 1000 MCG/ML KIT Inject 1 Dose as directed every 21 ( twenty-one) days.   Yes [provider]  diltiazem (CARDIZEM CD) 180 MG 24 hr capsule Take  180 mg by mouth daily.   Yes [provider]  furosemide (LASIX) 20 MG tablet Take 20 mg by mouth 2 (two) times daily.     Yes [provider]  glipiZIDE (GLUCOTROL) 5 MG tablet Take 5 mg by mouth daily.   Yes [provider]  methylPREDNISolone (MEDROL) 4 MG tablet Take 4 mg by mouth as needed.   Yes [provider]  montelukast (SINGULAIR) 10 MG tablet Take 10 mg by mouth at bedtime.   Yes [provider]  ondansetron (ZOFRAN) 4 MG tablet Take 1 tablet (4 mg total) by mouth every 8 (eight) hours as needed for nausea. 09/30/12  Yes SCalvert Cantor MD  POTASSIUM CHLORIDE PO Take 20 mEq by mouth every morning.    Yes [provider]  vitamin B-12 (CYANOCOBALAMIN) 1000 MCG tablet Take 1,000 mcg by mouth daily.   Yes [provider]  donepezil (ARICEPT) 5 MG tablet Take 1 tablet (5 mg total) by mouth at bedtime. 07/26/17   WKathrynn Ducking MD      Allergies  Allergen Reactions  . Azithromycin Anaphylaxis  . Aspirin Other (See Comments)    Gomez states that is causes an asthma attack.  . Avelox [Moxifloxacin Hcl In Nacl] Swelling  . Diovan [Valsartan] Swelling  . Remicade [Infliximab] Hives  . Amoxicillin Rash  . Metformin And Related Rash  . Penicillins Rash  . Percocet [Oxycodone-Acetaminophen] Nausea Only  . Prednisone Rash  . Propofol Rash  . Sulfa Antibiotics Rash  . Vicodin [Hydrocodone-Acetaminophen] Nausea Only    ROS:  Out of a complete 14 system review of symptoms, Kristin Gomez complains only of Kristin following symptoms, and all other reviewed systems are negative.  Fevers and chills Hearing loss Shortness of breath Urinary incontinence Feeling cold Memory loss, confusion, weakness Decreased energy, disinterest in activities Sleepiness  Blood pressure 118/70, pulse 68, weight 150 lb 8 oz (68.3 kg), SpO2 97 %.  Physical Exam  General: Kristin Gomez is alert and cooperative at Kristin time of Kristin  examination.  Eyes: Pupils are equal, round, and reactive to light. Discs are flat bilaterally.  Neck: Kristin neck is supple, no carotid bruits are noted.  Respiratory: Kristin respiratory examination is clear.  Cardiovascular: Kristin cardiovascular examination reveals a regular rate and rhythm, no obvious murmurs or rubs are noted.  Skin: Extremities are without significant edema.  Neurologic Exam  Mental status: Kristin Gomez is alert and oriented x 3 at Kristin time of Kristin examination. Kristin Mini-Mental status examination done today shows a total score of 21/30.  Cranial nerves: Facial symmetry is present. There is good sensation of Kristin face to pinprick and soft touch bilaterally. Kristin strength of Kristin facial muscles and Kristin muscles to head turning and  shoulder shrug are normal bilaterally. Speech is well enunciated, no aphasia or dysarthria is noted. Extraocular movements are full. Visual fields are full. Kristin tongue is midline, and Kristin Gomez has symmetric elevation of Kristin soft palate. No obvious hearing deficits are noted.  Motor: Kristin motor testing reveals 5 over 5 strength of all 4 extremities. Good symmetric motor tone is noted throughout.  Sensory: Sensory testing is intact to pinprick, soft touch, vibration sensation, and position sense on all 4 extremities. No evidence of extinction is noted.  Coordination: Cerebellar testing reveals good finger-nose-finger and heel-to-shin bilaterally.  Gait and station: Gait is normal. Tandem gait is normal. Romberg is negative. No drift is seen.  Reflexes: Deep tendon reflexes are symmetric and normal bilaterally. Toes are downgoing bilaterally.   Assessment/Plan:  1.  Progressive memory disturbance  2.  Small vessel disease by CT brain  Kristin Gomez does have a memory disturbance, we will consider low-dose Aricept at this time, taking 5 mg at night.  She will call if she is tolerating Kristin drug and we may increase to Kristin maintenance dose of 10 mg.  It is  not clear whether or not Kristin Gomez actually had a stroke in January 2019.  Kristin Gomez has been placed on Eliquis, Kristin indications for this are not clear to me.  Kristin Gomez will be set up for speech therapy for cognitive training, she will follow-up through this office in 6 months.  Jill Alexanders MD 07/26/2017 2:33 PM  [facility] [address] [place], [place] [address]  Phone [phone] Fax [phone]

## 2017-07-26 NOTE — Patient Instructions (Signed)
We will start Aricept for the memory.   Begin Aricept (donepezil) at 5 mg at night for one month. If this medication is well-tolerated, please call our office and we will call in a prescription for the 10 mg tablets. Look out for side effects that may include nausea, diarrhea, weight loss, or stomach cramps. This medication will also cause a runny nose, therefore there is no need for allergy medications for this purpose.  

## 2017-08-21 ENCOUNTER — Telehealth: Payer: Self-pay | Admitting: Neurology

## 2017-08-21 MED ORDER — DONEPEZIL HCL 10 MG PO TABS: 10.0000 mg | ORAL_TABLET | Freq: Every day | ORAL | 5 refills | Status: DC

## 2017-08-21 NOTE — Telephone Encounter (Signed)
I called daughter back and advised we sent in new prescription for the Aricept 10mg  tablet. She verbalized understanding and appreciation for call.

## 2017-08-21 NOTE — Telephone Encounter (Signed)
Received VO from Dr. Krista Blue: okay to increase to 10mg  aricept as previously discussed with Dr. Jannifer Franklin

## 2017-08-21 NOTE — Addendum Note (Signed)
Addended by: Hope Pigeon on: 08/21/2017 02:29 PM   Modules accepted: Orders

## 2017-08-21 NOTE — Telephone Encounter (Signed)
Pt daughter Nadiah, [place] [phone] on DPR has called stating that Dr Jannifer Franklin stated if pt tolerated the donepezil (ARICEPT) 5 MG tablet well for daughter to call for an increase in strength/dose.  Please call.  Pt still uses [place], [place] [phone] (Phone) [phone] (Fax)

## 2017-09-25 ENCOUNTER — Encounter (HOSPITAL_COMMUNITY): Payer: Self-pay | Admitting: Student

## 2017-09-25 ENCOUNTER — Other Ambulatory Visit: Payer: Self-pay

## 2017-09-25 ENCOUNTER — Emergency Department (HOSPITAL_COMMUNITY): Payer: Medicare HMO

## 2017-09-25 ENCOUNTER — Observation Stay (HOSPITAL_COMMUNITY)
Admission: EM | Admit: 2017-09-25 | Discharge: 2017-09-27 | Disposition: A | Discharge status: Home / Self Care | Payer: Medicare HMO | Attending: Internal Medicine | Admitting: Internal Medicine

## 2017-09-25 DIAGNOSIS — K529 Noninfective gastroenteritis and colitis, unspecified: Principal | ICD-10-CM

## 2017-09-25 DIAGNOSIS — N179 Acute kidney failure, unspecified: Secondary | ICD-10-CM | POA: Diagnosis not present

## 2017-09-25 DIAGNOSIS — Z8673 Personal history of transient ischemic attack (TIA), and cerebral infarction without residual deficits: Secondary | ICD-10-CM | POA: Insufficient documentation

## 2017-09-25 DIAGNOSIS — Z79899 Other long term (current) drug therapy: Secondary | ICD-10-CM | POA: Diagnosis not present

## 2017-09-25 DIAGNOSIS — J45909 Unspecified asthma, uncomplicated: Secondary | ICD-10-CM | POA: Diagnosis not present

## 2017-09-25 DIAGNOSIS — Z7984 Long term (current) use of oral hypoglycemic drugs: Secondary | ICD-10-CM | POA: Insufficient documentation

## 2017-09-25 DIAGNOSIS — Z950 Presence of cardiac pacemaker: Secondary | ICD-10-CM | POA: Insufficient documentation

## 2017-09-25 DIAGNOSIS — I110 Hypertensive heart disease with heart failure: Secondary | ICD-10-CM | POA: Diagnosis not present

## 2017-09-25 DIAGNOSIS — I10 Essential (primary) hypertension: Secondary | ICD-10-CM | POA: Diagnosis present

## 2017-09-25 DIAGNOSIS — I480 Paroxysmal atrial fibrillation: Secondary | ICD-10-CM | POA: Diagnosis present

## 2017-09-25 DIAGNOSIS — E119 Type 2 diabetes mellitus without complications: Secondary | ICD-10-CM | POA: Diagnosis not present

## 2017-09-25 DIAGNOSIS — I5022 Chronic systolic (congestive) heart failure: Secondary | ICD-10-CM | POA: Insufficient documentation

## 2017-09-25 DIAGNOSIS — I509 Heart failure, unspecified: Secondary | ICD-10-CM

## 2017-09-25 DIAGNOSIS — E860 Dehydration: Secondary | ICD-10-CM

## 2017-09-25 HISTORY — DX: Cerebral infarction, unspecified: I63.9

## 2017-09-25 HISTORY — DX: Type 2 diabetes mellitus without complications: E11.9

## 2017-09-25 HISTORY — DX: Personal history of other medical treatment: Z92.89

## 2017-09-25 HISTORY — DX: Pure hypercholesterolemia, unspecified: E78.00

## 2017-09-25 HISTORY — DX: Acute myocardial infarction, unspecified: I21.9

## 2017-09-25 LAB — CBC WITH DIFFERENTIAL/PLATELET
ABS IMMATURE GRANULOCYTES: 0.5 10*3/uL — AB (ref 0.0–0.1)
BASOS ABS: 0.1 10*3/uL (ref 0.0–0.1)
BASOS PCT: 0 %
Eosinophils Absolute: 0.1 10*3/uL (ref 0.0–0.7)
Eosinophils Relative: 0 %
HCT: 50 % — ABNORMAL HIGH (ref 36.0–46.0)
Hemoglobin: 16.5 g/dL — ABNORMAL HIGH (ref 12.0–15.0)
IMMATURE GRANULOCYTES: 3 %
Lymphocytes Relative: 17 %
Lymphs Abs: 2.4 10*3/uL (ref 0.7–4.0)
MCH: 32.3 pg (ref 26.0–34.0)
MCHC: 33 g/dL (ref 30.0–36.0)
MCV: 97.8 fL (ref 78.0–100.0)
Monocytes Absolute: 0.8 10*3/uL (ref 0.1–1.0)
Monocytes Relative: 6 %
NEUTROS ABS: 10.1 10*3/uL — AB (ref 1.7–7.7)
Neutrophils Relative %: 74 %
PLATELETS: 196 10*3/uL (ref 150–400)
RBC: 5.11 MIL/uL (ref 3.87–5.11)
RDW: 14.6 % (ref 11.5–15.5)
WBC: 13.9 10*3/uL — ABNORMAL HIGH (ref 4.0–10.5)

## 2017-09-25 LAB — MAGNESIUM: Magnesium: 2.1 mg/dL (ref 1.7–2.4)

## 2017-09-25 LAB — COMPREHENSIVE METABOLIC PANEL
ALT: 31 U/L (ref 0–44)
AST: 20 U/L (ref 15–41)
Albumin: 2.9 g/dL — ABNORMAL LOW (ref 3.5–5.0)
Alkaline Phosphatase: 67 U/L (ref 38–126)
Anion gap: 13 (ref 5–15)
BUN: 22 mg/dL (ref 8–23)
CHLORIDE: 107 mmol/L (ref 98–111)
CO2: 22 mmol/L (ref 22–32)
CREATININE: 1.03 mg/dL — AB (ref 0.44–1.00)
Calcium: 8.5 mg/dL — ABNORMAL LOW (ref 8.9–10.3)
GFR calc Af Amer: >60 mL/min (ref >60)
GFR calc non Af Amer: 53 mL/min — ABNORMAL LOW (ref >60)
Glucose, Bld: 227 mg/dL — ABNORMAL HIGH (ref 70–99)
Potassium: 4.3 mmol/L (ref 3.5–5.1)
SODIUM: 142 mmol/L (ref 135–145)
Total Bilirubin: 1 mg/dL (ref 0.3–1.2)
Total Protein: 5.7 g/dL — ABNORMAL LOW (ref 6.5–8.1)

## 2017-09-25 LAB — I-STAT TROPONIN, ED
TROPONIN I, POC: 0.02 ng/mL (ref 0.00–0.08)
TROPONIN I, POC: 0.02 ng/mL (ref 0.00–0.08)

## 2017-09-25 LAB — I-STAT CG4 LACTIC ACID, ED
Lactic Acid, Venous: 2.95 mmol/L (ref 0.5–1.9)
Lactic Acid, Venous: 3.09 mmol/L (ref 0.5–1.9)

## 2017-09-25 LAB — LIPASE, BLOOD: Lipase: 50 U/L (ref 11–51)

## 2017-09-25 MED ORDER — BARIUM SULFATE 2.1 % PO SUSP
ORAL | Status: AC
  Filled 2017-09-25: qty 2

## 2017-09-25 MED ORDER — SODIUM CHLORIDE 0.9 % IV SOLN: INTRAVENOUS | Status: DC

## 2017-09-25 MED ORDER — SODIUM CHLORIDE 0.9 % IV BOLUS
1000.0000 mL | Freq: Once | INTRAVENOUS | Status: AC
  Administered 2017-09-25: 1000 mL via INTRAVENOUS

## 2017-09-25 NOTE — ED Triage Notes (Signed)
Patient c/o generalized weakness, diarrhea x2 weeks, dizziness and near syncopal episodes. Patient was dry-heaving in triage.

## 2017-09-25 NOTE — ED Notes (Addendum)
I-Stat Lactic Acid results of 2.95 reported to PA, Samantha.

## 2017-09-25 NOTE — ED Provider Notes (Addendum)
[facility] EMERGENCY DEPARTMENT Provider Note   CSN: [id number] Arrival date & time: 09/25/17  1943   History   Chief Complaint Chief Complaint  Patient presents with  . Weakness    HPI Kristin Gomez is a 72 y.o. 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  Patient states that she has had 2-3 episodes of nonbloody diarrhea daily for the past few weeks.  She states that with this she has felt generally weak and fatigued.  No focal weakness.  She reports that over the past few days this has seemed to progressively worsen, to the point where she cannot do her typical activities and just wants to lay in bed, in the emergency department she is unable to pull herself up in the stretcher.  She typically walks at baseline.  Over the past few days she is also started to have some lightheadedness which is worse with activity and with transition from sitting to standing.  She has also noted some mild shortness of breath as well.  No other specific alleviating or aggravating factors to her symptoms.  Her daughter reports that she does not really drink water, she will drink diet soda, somewhat decreased overall PO intake.  She had some nausea in triage, she states it is the first time she has had nausea with this course of illness, this is resolved at this time, no vomiting.   Denies abdominal pain, blood in stool, fevers, dysuria, chest pain, or palpitations. Denies recent foreign travel or hospitalizations.   HPI  Past Medical History:  Diagnosis Date  . Asthma   . Diabetes mellitus    controlled by diet  . Fibromyalgia   . Heart disease   . Hypertension   . Memory difficulty 07/26/2017  . Pacemaker   . Psoriatic arthritis, destructive type  [facility])     Patient Active Problem List   Diagnosis Date Noted  . Memory difficulty 07/26/2017  . Mucoid cyst of joint 04/04/2011  . Goiter 11/25/2010  . Bartholin's gland cyst 11/25/2010  . Hypertension 11/25/2010  . Fibromyalgia 11/25/2010    Past Surgical History:  Procedure Laterality Date  . CATARACT EXTRACTION    . CHOLECYSTECTOMY    . COLONOSCOPY    . I&D EXTREMITY  1/13   lt long finger  . I&D EXTREMITY  03/31/2011   Procedure: IRRIGATION AND DEBRIDEMENT EXTREMITY;  Surgeon: Tennis Must, MD;  Location: [place];  Service: Orthopedics;  Laterality: Left;  left long finger  . KNEE SURGERY    . MASS EXCISION  03/08/2011   Procedure: EXCISION MASS;  Surgeon: Tennis Must, MD;  Location: [place];  Service: Orthopedics;  Laterality: Left;  Excision Mass Left Long Finger and Debridement of Distal Interphalangeal  Joint  . PACEMAKER INSERTION    . TONSILLECTOMY    . TOTAL ABDOMINAL HYSTERECTOMY       OB History    Gravida  2   Para  2   Term  2   Preterm      AB      Living  2     SAB      TAB      Ectopic      Multiple      Live Births  Home Medications    Prior to Admission medications   Medication Sig Start Date End Date Taking? Authorizing Provider  apixaban (ELIQUIS) 2.5 MG TABS tablet Take 2.5 mg by mouth 2 (two) times daily.    [provider]  atenolol (TENORMIN) 50 MG tablet Take 50 mg by mouth 2 (two) times daily.     [provider]  Cholecalciferol (VITAMIN D3 PO) Take 2 tablets by mouth daily. 44mg    [provider]  Cyanocobalamin (B-12 COMPLIANCE INJECTION) 1000 MCG/ML KIT Inject 1 Dose as directed every 21 ( twenty-one) days.    [provider]  diltiazem (CARDIZEM CD) 180 MG 24 hr capsule Take 180 mg by mouth daily.    [provider]  donepezil (ARICEPT) 10 MG tablet Take 1 tablet (10 mg total) by mouth at bedtime. 08/21/17   YMarcial Pacas MD   furosemide (LASIX) 20 MG tablet Take 20 mg by mouth 2 (two) times daily.      [provider]  glipiZIDE (GLUCOTROL) 5 MG tablet Take 5 mg by mouth daily.    [provider]  methylPREDNISolone (MEDROL) 4 MG tablet Take 4 mg by mouth as needed.    [provider]  montelukast (SINGULAIR) 10 MG tablet Take 10 mg by mouth at bedtime.    [provider]  ondansetron (ZOFRAN) 4 MG tablet Take 1 tablet (4 mg total) by mouth every 8 (eight) hours as needed for nausea. 09/30/12   SCalvert Cantor MD  POTASSIUM CHLORIDE PO Take 20 mEq by mouth every morning.     [provider]  vitamin B-12 (CYANOCOBALAMIN) 1000 MCG tablet Take 1,000 mcg by mouth daily.    [provider]    Family History Family History  Problem Relation Age of Onset  . Alzheimer's disease Mother   . Heart attack Father   . Diabetes Brother     Social History Social History   Tobacco Use  . Smoking status: Never Smoker  . Smokeless tobacco: Never Used  Substance Use Topics  . Alcohol use: No  . Drug use: No     Allergies   Azithromycin; Aspirin; Avelox [moxifloxacin hcl in nacl]; Diovan [valsartan]; Remicade [infliximab]; Amoxicillin; Metformin and related; Penicillins; Percocet [oxycodone-acetaminophen]; Prednisone; Propofol; Sulfa antibiotics; and Vicodin [hydrocodone-acetaminophen]   Review of Systems Review of Systems  All other systems reviewed and are negative.    Physical Exam Updated Vital Signs BP 99/73 (BP Location: Right Arm)   Pulse 78   Temp 97.9 F (36.6 C) (Oral)   Ht _0  (1.651 m)   Wt 67.1 kg   SpO2 95%   BMI 24.63 kg/m   Physical Exam  Constitutional: She appears well-developed and well-nourished.  Non-toxic appearance. No distress.  HENT:  Head: Normocephalic and atraumatic.  Dry mucous membranes.  Eyes: Conjunctivae are normal. Right eye exhibits no discharge. Left eye exhibits no discharge.  Neck: Neck supple.    Cardiovascular: Normal rate and regular rhythm.  Pulmonary/Chest: Breath sounds normal. Tachypnea noted. No respiratory distress. She has no wheezes. She has no rhonchi. She has no rales.  Respiration even and unlabored  Abdominal: Soft. She exhibits distension (mild). There is generalized tenderness (generalized, more prominent in LLQ). There is no rebound and no guarding.  Musculoskeletal: She exhibits edema (trace symmetric bilateral).  Neurological: She is alert.  Clear speech.  CN II through XII grossly intact.  Symmetric strength to upper and lower extremities.  Patient does have decreased strength throughout, she  has difficulty pulling herself up off the stretcher for me to listen to her lungs, unable to do this without assistance.  Skin: Skin is warm and dry. No rash noted.  Psychiatric: She has a normal mood and affect. Her behavior is normal.  Nursing note and vitals reviewed.    ED Treatments / Results  Labs (all labs ordered are listed, but only abnormal results are displayed) Labs Reviewed  CBC WITH DIFFERENTIAL/PLATELET - Abnormal; Notable for the following components:      Result Value   WBC 13.9 (*)    Hemoglobin 16.5 (*)    HCT 50.0 (*)    Neutro Abs 10.1 (*)    Abs Immature Granulocytes 0.5 (*)    All other components within normal limits  COMPREHENSIVE METABOLIC PANEL - Abnormal; Notable for the following components:   Glucose, Bld 227 (*)    Creatinine, Ser 1.03 (*)    Calcium 8.5 (*)    Total Protein 5.7 (*)    Albumin 2.9 (*)    GFR calc non Af Amer 53 (*)    All other components within normal limits  I-STAT CG4 LACTIC ACID, ED - Abnormal; Notable for the following components:   Lactic Acid, Venous 3.09 (*)    All other components within normal limits  I-STAT CG4 LACTIC ACID, ED - Abnormal; Notable for the following components:   Lactic Acid, Venous 2.95 (*)    All other components within normal limits  LIPASE, BLOOD  MAGNESIUM  URINALYSIS, ROUTINE W  REFLEX MICROSCOPIC  I-STAT TROPONIN, ED  I-STAT TROPONIN, ED  CBG MONITORING, ED    EKG EKG Interpretation  Date/Time:  Monday September 25 2017 20:14:56 EDT Ventricular Rate:  85 PR Interval:  154 QRS Duration: 106 QT Interval:  436 QTC Calculation: 518 R Axis:   150 Text Interpretation:  atrial  with Premature atrial complexes with Abberant conduction No significant change since last tracing Confirmed by Blanchie Dessert [phone]) on 09/25/2017 8:53:31 PM   Radiology Dg Chest 2 View  Result Date: 09/25/2017 CLINICAL DATA:  Initial evaluation for acute weakness for 2-3 weeks. EXAM: CHEST - 2 VIEW COMPARISON:  Prior radiograph from 07/12/2017 FINDINGS: Left-sided triple lead pacemaker/AICD in place, stable. Moderate cardiomegaly unchanged. Mediastinal silhouette within normal limits. Aortic atherosclerosis noted. Lungs mildly hypoinflated. No focal infiltrates. No pulmonary edema or pleural effusion. No pneumothorax. No acute osseous abnormality. Chronic compression deformity within the midthoracic spine is stable from previous. IMPRESSION: 1. No active cardiopulmonary disease. 2. Moderate cardiomegaly with left-sided pacemaker/AICD in place. No edema. Electronically Signed   By: Jeannine Boga M.D.   On: 09/25/2017 21:49    Procedures Procedures (including critical care time)  Medications Ordered in ED Medications  sodium chloride 0.9 % bolus 1,000 mL (1,000 mLs Intravenous Bolus from Bag 09/25/17 2158)    And  0.9 %  sodium chloride infusion (has no administration in time range)  Barium Sulfate 2.1 % SUSP (has no administration in time range)  sodium chloride 0.9 % bolus 1,000 mL (1,000 mLs Intravenous Bolus from Bag 09/25/17 2351)     Initial Impression / Assessment and Plan / ED Course  I have reviewed the triage vital signs and the nursing notes.  Pertinent labs & imaging results that were available during my care of the patient were reviewed by me and considered in my  medical decision making (see chart for details).   Patient presents to the emergency department with generalized weakness and diarrhea over the past few  weeks.  He also mentions some mild shortness of breath.  Patient initial vitals within normal limits, soft pressures, rectal temperature subsequently obtained and remains WNL at 99.3.  On exam she is somewhat tachypneic, but does not appear to be in respiratory distress, lungs CTA.  She has generalized abdominal tenderness which appears to be increased in the left lower quadrant. Patient appears dry on exam. She is weak and is unable to sit up without assistance, no focal deficits noted.   Initial blood work per triage reviewed: Patient has leukocytosis at 13.9 with left shift.  She is not anemic.  Creatinine of 1.03 improved from prior.  Mild hypocalcemia at 8.5.  Hyperglycemia at 227. LFTs and lipase WNL. EKG without significant change from previous, troponin negative, low suspicion or ACS at this time. CXR without active cardiopulmonary disease, moderate cardiomegaly without edema.   Patient's initial lactic acid elevated at 3.09, subsequent mild decrease to 2.95 after about 1L of fluids, additional 1 L ordered. Discussed with supervising physician Dr. Maryan Rued, at this time suspect elevated lactic acid to be secondary to dehydration as opposed to overt sepsis, however remains on ddx, will continue to monitor and rehydrate, will hold abx, there are blood cultures in the lab per provider in triage if necessary.  Additional 1L of NS ordered. Patient pending CT abdomen/pelvis w PO contrast secondary to IV dye allergy.   Patient signed out to Quincy Carnes PA-C at change of shift pending CT abdomen/pelvis and trending of lactic acid. Patient will require re-evaluation. Unless benign CT with significant improvement in overall presentation including ability to ambulate suspect patient will require admission.   Findings and plan of care discussed with  supervising physician Dr. Maryan Rued who personally evaluated and examined this patient and is in agreement.   Final Clinical Impressions(s) / ED Diagnoses   Final diagnoses:  None    ED Discharge Orders    None       Amaryllis Dyke, PA-C 09/26/17 0059    Amaryllis Dyke, PA-C 09/26/17 0153    Blanchie Dessert, MD 09/26/17 2216

## 2017-09-25 NOTE — ED Provider Notes (Signed)
MSE was initiated and I personally evaluated the patient and placed orders (if any) at  9:02 PM on September 25, 2017.  The patient appears stable so that the remainder of the MSE may be completed by another provider.  Patient placed in Quick Look pathway, seen and evaluated   Chief Complaint: Weakness  HPI:   Patient is a 72 year old female with a history of dilated cardiomyopathy, pacemaker in place, type 2 diabetes mellitus, fibromyalgia presenting for generalized weakness for 2 weeks.  Patient saw her daughter today and her daughter felt that there was market change in patient's functional status and encouraged patient over the emergency department.  Patient reports that she has no energy to do anything, but denies any focal pain or other specific symptoms.  Patient reports that she has nausea daily, and for the past 2 days has had diarrhea twice daily.  No fever or chills.  No cough.  Patient denies chest pain, but does report that she is short of breath with exertion.  ROS: See HPI (one)  Physical Exam:   Gen: No distress.  Neuro: Awake and Alert  Skin: Warm. Diffuse psoriatic lesions.    Focused Exam: Heart regular rate and rhythm.  Coarse crackles in bilateral lung bases.  Bilateral radial pulses 2+ and equal in all extremities.  No lower extremity edema. Bowel sounds present in all 4 quadrants and epigastrium.  No focal tenderness, guarding or rebound to the abdomen.    Initiation of care has begun. The patient has been counseled on the process, plan, and necessity for staying for the completion/evaluation, and the remainder of the medical screening examination    Tamala Julian 09/25/17 2104    Pattricia Boss, MD 09/28/17 [phone]

## 2017-09-26 ENCOUNTER — Encounter (HOSPITAL_COMMUNITY): Payer: Self-pay | Admitting: Internal Medicine

## 2017-09-26 ENCOUNTER — Emergency Department (HOSPITAL_COMMUNITY): Payer: Medicare HMO

## 2017-09-26 DIAGNOSIS — E119 Type 2 diabetes mellitus without complications: Secondary | ICD-10-CM | POA: Diagnosis not present

## 2017-09-26 DIAGNOSIS — K529 Noninfective gastroenteritis and colitis, unspecified: Secondary | ICD-10-CM | POA: Diagnosis present

## 2017-09-26 DIAGNOSIS — N179 Acute kidney failure, unspecified: Secondary | ICD-10-CM

## 2017-09-26 DIAGNOSIS — I480 Paroxysmal atrial fibrillation: Secondary | ICD-10-CM | POA: Diagnosis present

## 2017-09-26 DIAGNOSIS — I10 Essential (primary) hypertension: Secondary | ICD-10-CM | POA: Diagnosis not present

## 2017-09-26 DIAGNOSIS — I509 Heart failure, unspecified: Secondary | ICD-10-CM

## 2017-09-26 LAB — CBC WITH DIFFERENTIAL/PLATELET
ABS IMMATURE GRANULOCYTES: 0.3 10*3/uL — AB (ref 0.0–0.1)
BASOS ABS: 0.1 10*3/uL (ref 0.0–0.1)
Basophils Relative: 1 %
Eosinophils Absolute: 0 10*3/uL (ref 0.0–0.7)
Eosinophils Relative: 0 %
HEMATOCRIT: 41.3 % (ref 36.0–46.0)
HEMOGLOBIN: 13.9 g/dL (ref 12.0–15.0)
Immature Granulocytes: 3 %
LYMPHS ABS: 1.3 10*3/uL (ref 0.7–4.0)
LYMPHS PCT: 14 %
MCH: 32.9 pg (ref 26.0–34.0)
MCHC: 33.7 g/dL (ref 30.0–36.0)
MCV: 97.6 fL (ref 78.0–100.0)
Monocytes Absolute: 0.6 10*3/uL (ref 0.1–1.0)
Monocytes Relative: 6 %
NEUTROS ABS: 7.3 10*3/uL (ref 1.7–7.7)
Neutrophils Relative %: 76 %
Platelets: 147 10*3/uL — ABNORMAL LOW (ref 150–400)
RBC: 4.23 MIL/uL (ref 3.87–5.11)
RDW: 14.9 % (ref 11.5–15.5)
WBC: 9.6 10*3/uL (ref 4.0–10.5)

## 2017-09-26 LAB — HEPATIC FUNCTION PANEL
ALBUMIN: 2.2 g/dL — AB (ref 3.5–5.0)
ALK PHOS: 60 U/L (ref 38–126)
ALT: 24 U/L (ref 0–44)
AST: 24 U/L (ref 15–41)
BILIRUBIN INDIRECT: 0.9 mg/dL (ref 0.3–0.9)
Bilirubin, Direct: 0.5 mg/dL — ABNORMAL HIGH (ref 0.0–0.2)
TOTAL PROTEIN: 4.2 g/dL — AB (ref 6.5–8.1)
Total Bilirubin: 1.4 mg/dL — ABNORMAL HIGH (ref 0.3–1.2)

## 2017-09-26 LAB — BASIC METABOLIC PANEL
ANION GAP: 9 (ref 5–15)
BUN: 14 mg/dL (ref 8–23)
CHLORIDE: 111 mmol/L (ref 98–111)
CO2: 21 mmol/L — ABNORMAL LOW (ref 22–32)
Calcium: 7.2 mg/dL — ABNORMAL LOW (ref 8.9–10.3)
Creatinine, Ser: 0.82 mg/dL (ref 0.44–1.00)
GFR calc Af Amer: >60 mL/min (ref >60)
GLUCOSE: 127 mg/dL — AB (ref 70–99)
POTASSIUM: 3.9 mmol/L (ref 3.5–5.1)
SODIUM: 141 mmol/L (ref 135–145)

## 2017-09-26 LAB — C DIFFICILE QUICK SCREEN W PCR REFLEX
C DIFFICLE (CDIFF) ANTIGEN: NEGATIVE
C Diff interpretation: NOT DETECTED
C Diff toxin: NEGATIVE

## 2017-09-26 LAB — GLUCOSE, CAPILLARY
GLUCOSE-CAPILLARY: 117 mg/dL — AB (ref 70–99)
GLUCOSE-CAPILLARY: 122 mg/dL — AB (ref 70–99)
GLUCOSE-CAPILLARY: 172 mg/dL — AB (ref 70–99)
Glucose-Capillary: 134 mg/dL — ABNORMAL HIGH (ref 70–99)
Glucose-Capillary: 133 mg/dL — ABNORMAL HIGH (ref 70–99)

## 2017-09-26 LAB — LACTIC ACID, PLASMA
LACTIC ACID, VENOUS: 2.6 mmol/L — AB (ref 0.5–1.9)
Lactic Acid, Venous: 1.8 mmol/L (ref 0.5–1.9)

## 2017-09-26 MED ORDER — DILTIAZEM HCL ER COATED BEADS 180 MG PO CP24
180.0000 mg | ORAL_CAPSULE | Freq: Every day | ORAL | Status: DC
  Administered 2017-09-26: 180 mg via ORAL
  Filled 2017-09-26: qty 1

## 2017-09-26 MED ORDER — AZTREONAM 1 G IJ SOLR
1.0000 g | Freq: Three times a day (TID) | INTRAMUSCULAR | Status: DC
  Administered 2017-09-26 – 2017-09-27 (×3): 1 g via INTRAVENOUS
  Filled 2017-09-26 (×5): qty 1

## 2017-09-26 MED ORDER — SODIUM CHLORIDE 0.9 % IV SOLN
1.0000 g | Freq: Once | INTRAVENOUS | Status: AC
  Administered 2017-09-26: 1 g via INTRAVENOUS
  Filled 2017-09-26: qty 1

## 2017-09-26 MED ORDER — ONDANSETRON HCL 4 MG PO TABS: 4.0000 mg | ORAL_TABLET | Freq: Four times a day (QID) | ORAL | Status: DC | PRN

## 2017-09-26 MED ORDER — SODIUM CHLORIDE 0.9 % IV SOLN: INTRAVENOUS | Status: DC

## 2017-09-26 MED ORDER — MONTELUKAST SODIUM 10 MG PO TABS
10.0000 mg | ORAL_TABLET | Freq: Every day | ORAL | Status: DC
  Administered 2017-09-26: 10 mg via ORAL
  Filled 2017-09-26: qty 1

## 2017-09-26 MED ORDER — PANTOPRAZOLE SODIUM 20 MG PO TBEC
20.0000 mg | DELAYED_RELEASE_TABLET | Freq: Every day | ORAL | Status: DC
  Administered 2017-09-26 – 2017-09-27 (×2): 20 mg via ORAL
  Filled 2017-09-26 (×2): qty 1

## 2017-09-26 MED ORDER — ONDANSETRON HCL 4 MG/2ML IJ SOLN: 4.0000 mg | Freq: Four times a day (QID) | INTRAMUSCULAR | Status: DC | PRN

## 2017-09-26 MED ORDER — IPRATROPIUM-ALBUTEROL 0.5-2.5 (3) MG/3ML IN SOLN: 3.0000 mL | Freq: Four times a day (QID) | RESPIRATORY_TRACT | Status: DC | PRN

## 2017-09-26 MED ORDER — METRONIDAZOLE IN NACL 5-0.79 MG/ML-% IV SOLN
500.0000 mg | Freq: Three times a day (TID) | INTRAVENOUS | Status: DC
  Administered 2017-09-26 – 2017-09-27 (×3): 500 mg via INTRAVENOUS
  Filled 2017-09-26 (×4): qty 100

## 2017-09-26 MED ORDER — ATENOLOL 50 MG PO TABS
50.0000 mg | ORAL_TABLET | Freq: Two times a day (BID) | ORAL | Status: DC
  Administered 2017-09-26 – 2017-09-27 (×3): 50 mg via ORAL
  Filled 2017-09-26 (×3): qty 1

## 2017-09-26 MED ORDER — ENOXAPARIN SODIUM 40 MG/0.4ML ~~LOC~~ SOLN
40.0000 mg | SUBCUTANEOUS | Status: DC
  Administered 2017-09-26 – 2017-09-27 (×2): 40 mg via SUBCUTANEOUS
  Filled 2017-09-26 (×2): qty 0.4

## 2017-09-26 MED ORDER — DONEPEZIL HCL 10 MG PO TABS
10.0000 mg | ORAL_TABLET | Freq: Every day | ORAL | Status: DC
  Administered 2017-09-26: 10 mg via ORAL
  Filled 2017-09-26: qty 1

## 2017-09-26 MED ORDER — SODIUM CHLORIDE 0.9 % IV SOLN
INTRAVENOUS | Status: DC
  Administered 2017-09-26: 23:00:00 via INTRAVENOUS

## 2017-09-26 MED ORDER — METRONIDAZOLE IN NACL 5-0.79 MG/ML-% IV SOLN
500.0000 mg | Freq: Once | INTRAVENOUS | Status: AC
  Administered 2017-09-26: 500 mg via INTRAVENOUS
  Filled 2017-09-26: qty 100

## 2017-09-26 MED ORDER — ACETAMINOPHEN 325 MG PO TABS: 650.0000 mg | ORAL_TABLET | Freq: Four times a day (QID) | ORAL | Status: DC | PRN

## 2017-09-26 MED ORDER — ALBUTEROL SULFATE HFA 108 (90 BASE) MCG/ACT IN AERS: 2.0000 | INHALATION_SPRAY | Freq: Four times a day (QID) | RESPIRATORY_TRACT | Status: DC | PRN

## 2017-09-26 MED ORDER — ACETAMINOPHEN 650 MG RE SUPP: 650.0000 mg | Freq: Four times a day (QID) | RECTAL | Status: DC | PRN

## 2017-09-26 MED ORDER — SODIUM CHLORIDE 0.9 % IV SOLN
INTRAVENOUS | Status: DC
  Administered 2017-09-26: 07:00:00 via INTRAVENOUS

## 2017-09-26 MED ORDER — INSULIN ASPART 100 UNIT/ML ~~LOC~~ SOLN
0.0000 [IU] | Freq: Three times a day (TID) | SUBCUTANEOUS | Status: DC
  Administered 2017-09-26 – 2017-09-27 (×5): 1 [IU] via SUBCUTANEOUS

## 2017-09-26 MED ORDER — VITAMIN B-12 1000 MCG PO TABS
1000.0000 ug | ORAL_TABLET | Freq: Every day | ORAL | Status: DC
  Administered 2017-09-26 – 2017-09-27 (×2): 1000 ug via ORAL
  Filled 2017-09-26 (×2): qty 1

## 2017-09-26 NOTE — Plan of Care (Signed)

## 2017-09-26 NOTE — ED Provider Notes (Signed)
Assumed care from PA Petrucelli at shift change.  Briefly, 72 y.o. F here with generalized weakness.  Has been having some watery, non-bloody diarrhea over the past few days with nausea.  No vomiting.  States very weak, hard to get around at home.  Labs today with leukocytosis and elevated lactate, has improved mildly with fluids.  Plan:  CT pending.  Results for orders placed or performed during the hospital encounter of 09/25/17  CBC with Differential  Result Value Ref Range   WBC 13.9 (H) 4.0 - 10.5 K/uL   RBC 5.11 3.87 - 5.11 MIL/uL   Hemoglobin 16.5 (H) 12.0 - 15.0 g/dL   HCT 50.0 (H) 36.0 - 46.0 %   MCV 97.8 78.0 - 100.0 fL   MCH 32.3 26.0 - 34.0 pg   MCHC 33.0 30.0 - 36.0 g/dL   RDW 14.6 11.5 - 15.5 %   Platelets 196 150 - 400 K/uL   Neutrophils Relative % 74 %   Neutro Abs 10.1 (H) 1.7 - 7.7 K/uL   Lymphocytes Relative 17 %   Lymphs Abs 2.4 0.7 - 4.0 K/uL   Monocytes Relative 6 %   Monocytes Absolute 0.8 0.1 - 1.0 K/uL   Eosinophils Relative 0 %   Eosinophils Absolute 0.1 0.0 - 0.7 K/uL   Basophils Relative 0 %   Basophils Absolute 0.1 0.0 - 0.1 K/uL   Immature Granulocytes 3 %   Abs Immature Granulocytes 0.5 (H) 0.0 - 0.1 K/uL  Comprehensive metabolic panel  Result Value Ref Range   Sodium 142 135 - 145 mmol/L   Potassium 4.3 3.5 - 5.1 mmol/L   Chloride 107 98 - 111 mmol/L   CO2 22 22 - 32 mmol/L   Glucose, Bld 227 (H) 70 - 99 mg/dL   BUN 22 8 - 23 mg/dL   Creatinine, Ser 1.03 (H) 0.44 - 1.00 mg/dL   Calcium 8.5 (L) 8.9 - 10.3 mg/dL   Total Protein 5.7 (L) 6.5 - 8.1 g/dL   Albumin 2.9 (L) 3.5 - 5.0 g/dL   AST 20 15 - 41 U/L   ALT 31 0 - 44 U/L   Alkaline Phosphatase 67 38 - 126 U/L   Total Bilirubin 1.0 0.3 - 1.2 mg/dL   GFR calc non Af Amer 53 (L) >60 mL/min   GFR calc Af Amer >60 >60 mL/min   Anion gap 13 5 - 15  Lipase, blood  Result Value Ref Range   Lipase 50 11 - 51 U/L  Magnesium  Result Value Ref Range   Magnesium 2.1 1.7 - 2.4 mg/dL  I-stat  troponin, ED (0, 3)  Result Value Ref Range   Troponin i, poc 0.02 0.00 - 0.08 ng/mL   Comment 3          I-Stat CG4 Lactic Acid, ED  Result Value Ref Range   Lactic Acid, Venous 3.09 (HH) 0.5 - 1.9 mmol/L   Comment NOTIFIED PHYSICIAN   I-Stat CG4 Lactic Acid, ED  Result Value Ref Range   Lactic Acid, Venous 2.95 (HH) 0.5 - 1.9 mmol/L   Comment NOTIFIED PHYSICIAN   I-stat troponin, ED (0, 3)  Result Value Ref Range   Troponin i, poc 0.02 0.00 - 0.08 ng/mL   Comment 3           Ct Abdomen Pelvis Wo Contrast  Result Date: 09/26/2017 CLINICAL DATA:  Generalize weakness. Diarrhea for 2 weeks. Dizziness and near-syncope. EXAM: CT ABDOMEN AND PELVIS WITHOUT CONTRAST TECHNIQUE: Multidetector CT  imaging of the abdomen and pelvis was performed following the standard protocol without IV contrast. COMPARISON:  12/28/2013 FINDINGS: Lower chest: Dependent atelectasis and scarring in the lung bases. Fat in the fissures. Hepatobiliary: No focal liver abnormality is seen. Status post cholecystectomy. No biliary dilatation. Pancreas: Unremarkable. No pancreatic ductal dilatation or surrounding inflammatory changes. Spleen: Normal in size without focal abnormality. Adrenals/Urinary Tract: Adrenal glands are unremarkable. Kidneys are normal, without renal calculi, focal lesion, or hydronephrosis. Bladder is unremarkable. Stomach/Bowel: Stomach, small bowel, and colon are mostly decompressed. Contrast material flows through to the rectum suggesting no evidence of obstruction. There is mild but diffuse wall thickening demonstrated in the colon predominantly involving the descending and rectosigmoid colon. This suggest colitis and may indicate an infectious colitis or inflammatory bowel disease such as ulcerative colitis. Appendix is not identified. Vascular/Lymphatic: Aortic atherosclerosis. No enlarged abdominal or pelvic lymph nodes. Reproductive: Status post hysterectomy. No adnexal masses. Other: No free air or  free fluid in the abdomen. Abdominal wall musculature appears intact. Prominent visceral adipose tissue. Musculoskeletal: Compression deformities of T6, T7, and L1 vertebra with kyphoplasty changes at L1. Schmorl's node at the inferior endplate of L4. Degenerative changes in the spine with degenerative disc disease at L2-3 and L3-4 levels. IMPRESSION: Mild diffuse wall thickening in the colon predominantly involving the descending and rectosigmoid colon suggesting colitis and may indicate infectious colitis or inflammatory bowel disease. No evidence of bowel obstruction. Chronic appearing vertebral compression deformities. Prominent visceral adipose tissues. Electronically Signed   By: Lucienne Capers M.D.   On: 09/26/2017 01:16   Dg Chest 2 View  Result Date: 09/25/2017 CLINICAL DATA:  Initial evaluation for acute weakness for 2-3 weeks. EXAM: CHEST - 2 VIEW COMPARISON:  Prior radiograph from 07/12/2017 FINDINGS: Left-sided triple lead pacemaker/AICD in place, stable. Moderate cardiomegaly unchanged. Mediastinal silhouette within normal limits. Aortic atherosclerosis noted. Lungs mildly hypoinflated. No focal infiltrates. No pulmonary edema or pleural effusion. No pneumothorax. No acute osseous abnormality. Chronic compression deformity within the midthoracic spine is stable from previous. IMPRESSION: 1. No active cardiopulmonary disease. 2. Moderate cardiomegaly with left-sided pacemaker/AICD in place. No edema. Electronically Signed   By: Jeannine Boga M.D.   On: 09/25/2017 21:49   CT with signs of colitis.  Concern that this is infectious given her laboratory findings.  On repeat assessment after 2L of fluids, patient still appears very weak and fatigued, having a hard time just moving around in bed. She admits at home she is on the verge of passing out if up and moving around too long.  Feel she would benefit from admission for continued IV hydration and abx.  Patient and daughter in agreement.   Patient has multiple abx allergies.  Discussed with pharmacy, best option aztreonam and flagyl for now.    Will admit for ongoing care.  Discussed with Dr. Hal Hope, will admit for further care.   Larene Pickett, PA-C 09/26/17 [place], April, MD 09/26/17 [phone]

## 2017-09-26 NOTE — H&P (Signed)
History and Physical    Kristin Gomez:[id number] DOB: 03-09-1945 DOA: 09/25/2017  PCP: Raelyn Number, MD  Patient coming from: Home.  Chief Complaint: Weakness and diarrhea.  HPI: Kristin Gomez is a 72 y.o. female with history of chronic systolic heart failure status post biventricular pacemaker placement and AICD and as per the cardiology notes EF has come back to normal, stroke, history of paroxysmal atrial fibrillation presently not on anticoagulation was recently discontinued by neurologist, diabetes mellitus type 2 was brought to the ER after patient has benign weakness with diarrhea.  As per the patient's daughter over the last 2 weeks patient has been having persistent diarrhea after she eats anything.  Has not recently traveled or nodules any antibiotics.  Has some vague abdominal discomfort mostly in the right lower quadrant.  Denies any nausea vomiting but has benign poor appetite.  Denies chest pain shortness of breath or any falls.  ED Course: In the ER patient was mildly febrile with leukocytosis and elevated lactate.  CT scan of the abdomen shows left-sided colitis involving the descending and rectosigmoid area.  Patient was started on Azactam Flagyl due to allergies to other medications and hydration.  Admitted for further observation.  Review of Systems: As per HPI, rest all negative.   Past Medical History:  Diagnosis Date  . Asthma   . Diabetes mellitus    controlled by diet  . Fibromyalgia   . Heart disease   . Hypertension   . Memory difficulty 07/26/2017  . Pacemaker   . Psoriatic arthritis, destructive type [facility])     Past Surgical History:  Procedure Laterality Date  . CATARACT EXTRACTION    . CHOLECYSTECTOMY    . COLONOSCOPY    . I&D EXTREMITY  1/13   lt long finger  . I&D EXTREMITY  03/31/2011   Procedure: IRRIGATION AND DEBRIDEMENT EXTREMITY;  Surgeon: Tennis Must, MD;  Location: [facility];  Service: Orthopedics;  Laterality:  Left;  left long finger  . KNEE SURGERY    . MASS EXCISION  03/08/2011   Procedure: EXCISION MASS;  Surgeon: Tennis Must, MD;  Location: [place];  Service: Orthopedics;  Laterality: Left;  Excision Mass Left Long Finger and Debridement of Distal Interphalangeal  Joint  . PACEMAKER INSERTION    . TONSILLECTOMY    . TOTAL ABDOMINAL HYSTERECTOMY       reports that she has never smoked. She has never used smokeless tobacco. She reports that she does not drink alcohol or use drugs.  Allergies  Allergen Reactions  . Aspirin Shortness Of Breath and Other (See Comments)    Patient states that this caused an asthma attack  . Azithromycin Anaphylaxis  . Infliximab Hives    remicade  . Methotrexate Hives  . Mushroom Extract Complex Anaphylaxis, Shortness Of Breath and Swelling  . Nsaids Shortness Of Breath and Other (See Comments)    Asthma attack  . Avelox [Moxifloxacin Hcl In Nacl] Swelling    Site of swelling not recalled  . Diovan [Valsartan] Swelling    Site of swelling not recalled  . Amoxicillin Rash  . Atorvastatin Swelling and Other (See Comments)    Exacerbated Psoriatic arthritis   . Contrast Media [Iodinated Diagnostic Agents] Rash  . Latex Rash  . Metformin Rash  . Metformin And Related Rash    ??  . Penicillins Rash    Has patient had a PCN reaction causing immediate rash, facial/tongue/throat swelling, SOB  or lightheadedness with hypotension: Yes Has patient had a PCN reaction causing severe rash involving mucus membranes or skin necrosis: No Has patient had a PCN reaction that required hospitalization: No Has patient had a PCN reaction occurring within the last 10 years: No If all of the above answers are "NO", then may proceed with Cephalosporin use.   Marland Kitchen Percocet [Oxycodone-Acetaminophen] Nausea Only  . Prednisone Rash    Can only be tolerated with Benadryl  . Propofol Rash  . Sulfa Antibiotics Nausea Only and Rash  . Vicodin  [Hydrocodone-Acetaminophen] Nausea Only    Family History  Problem Relation Age of Onset  . Alzheimer's disease Mother   . Heart attack Father   . Diabetes Brother     Prior to Admission medications   Medication Sig Start Date End Date Taking? Authorizing Provider  albuterol (PROVENTIL HFA;VENTOLIN HFA) 108 (90 Base) MCG/ACT inhaler Inhale 2 puffs into the lungs every 6 (six) hours as needed for wheezing or shortness of breath.  11/20/12  Yes [provider]  atenolol (TENORMIN) 50 MG tablet Take 50 mg by mouth 2 (two) times daily.    Yes [provider]  Cholecalciferol (VITAMIN D3 PO) Take 2 capsules by mouth daily.    Yes [provider]  Cyanocobalamin (B-12 COMPLIANCE INJECTION) 1000 MCG/ML KIT Inject 1 mL into the muscle every 14 (fourteen) days.    Yes [provider]  diltiazem (CARDIZEM CD) 180 MG 24 hr capsule Take 180 mg by mouth at bedtime.    Yes [provider]  donepezil (ARICEPT) 10 MG tablet Take 1 tablet (10 mg total) by mouth at bedtime. 08/21/17  Yes Marcial Pacas, MD  furosemide (LASIX) 20 MG tablet Take 40 mg by mouth daily.    Yes [provider]  glipiZIDE (GLUCOTROL XL) 5 MG 24 hr tablet Take 5 mg by mouth at bedtime.   Yes [provider]  ipratropium-albuterol (DUONEB) 0.5-2.5 (3) MG/3ML SOLN Take 3 mLs by nebulization every 6 (six) hours as needed (for shortness of breath or wheezing).  07/04/17  Yes [provider]  lansoprazole (PREVACID) 30 MG capsule Take 30 mg by mouth daily as needed (for reflux).    Yes [provider]  methylPREDNISolone (MEDROL) 4 MG tablet Take 4 mg by mouth See admin instructions. Take 4 mg by mouth two to three times a day as needed for Psoriatic arthritis or fibro flares   Yes [provider]  montelukast (SINGULAIR) 10 MG tablet Take 10 mg by mouth at bedtime.   Yes [provider]  ondansetron (ZOFRAN-ODT) 4 MG disintegrating tablet Take 4 mg  by mouth every 8 (eight) hours as needed for nausea or vomiting.   Yes [provider]  potassium chloride SA (K-DUR,KLOR-CON) 20 MEQ tablet Take 20 mEq by mouth daily.   Yes [provider]  vitamin B-12 (CYANOCOBALAMIN) 1000 MCG tablet Take 1,000 mcg by mouth daily.   Yes [provider]  ondansetron (ZOFRAN) 4 MG tablet Take 1 tablet (4 mg total) by mouth every 8 (eight) hours as needed for nausea. Patient not taking: Reported on 09/25/2017 09/30/12   Calvert Cantor, MD    Physical Exam: Vitals:   09/26/17 0415 09/26/17 0430 09/26/17 0445 09/26/17 0500  BP: 130/72 136/79 128/84 (!) 141/82  Pulse: 88 91 81 91  Resp: (!) 29 20 19  (!) 26  Temp:      TempSrc:      SpO2: 94% 95% 95% 95%  Weight:  Height:          Constitutional: Moderately built and nourished. Vitals:   09/26/17 0415 09/26/17 0430 09/26/17 0445 09/26/17 0500  BP: 130/72 136/79 128/84 (!) 141/82  Pulse: 88 91 81 91  Resp: (!) 29 20 19  (!) 26  Temp:      TempSrc:      SpO2: 94% 95% 95% 95%  Weight:      Height:       Eyes: Anicteric no pallor. ENMT: No discharge from the ears eyes nose or mouth. Neck: No mass felt.  No neck rigidity.  No JVD appreciated. Respiratory: No rhonchi or crepitations. Cardiovascular: S1-S2 heard no murmurs appreciated. Abdomen: Soft nontender bowel sounds present. Musculoskeletal: No edema.  No joint effusion. Skin: No rash.  Skin appears warm. Neurologic: Alert awake oriented to time place and person.  Moves all extremities. Psychiatric: Appears normal per normal affect.   Labs on Admission: I have personally reviewed following labs and imaging studies  CBC: Recent Labs  Lab 09/25/17 2028  WBC 13.9*  NEUTROABS 10.1*  HGB 16.5*  HCT 50.0*  MCV 97.8  PLT 937   Basic Metabolic Panel: Recent Labs  Lab 09/25/17 2028  NA 142  K 4.3  CL 107  CO2 22  GLUCOSE 227*  BUN 22  CREATININE 1.03*  CALCIUM 8.5*  MG 2.1   GFR: Estimated  Creatinine Clearance: 44.4 mL/min (A) (by C-G formula based on SCr of 1.03 mg/dL (H)). Liver Function Tests: Recent Labs  Lab 09/25/17 2028  AST 20  ALT 31  ALKPHOS 67  BILITOT 1.0  PROT 5.7*  ALBUMIN 2.9*   Recent Labs  Lab 09/25/17 2028  LIPASE 50   No results for input(s): AMMONIA in the last 168 hours. Coagulation Profile: No results for input(s): INR, PROTIME in the last 168 hours. Cardiac Enzymes: No results for input(s): CKTOTAL, CKMB, CKMBINDEX, TROPONINI in the last 168 hours. BNP (last 3 results) No results for input(s): PROBNP in the last 8760 hours. HbA1C: No results for input(s): HGBA1C in the last 72 hours. CBG: No results for input(s): GLUCAP in the last 168 hours. Lipid Profile: No results for input(s): CHOL, HDL, LDLCALC, TRIG, CHOLHDL, LDLDIRECT in the last 72 hours. Thyroid Function Tests: No results for input(s): TSH, T4TOTAL, FREET4, T3FREE, THYROIDAB in the last 72 hours. Anemia Panel: No results for input(s): VITAMINB12, FOLATE, FERRITIN, TIBC, IRON, RETICCTPCT in the last 72 hours. Urine analysis:    Component Value Date/Time   COLORURINE YELLOW 09/30/2012 1812   APPEARANCEUR CLEAR 09/30/2012 1812   LABSPEC 1.012 09/30/2012 1812   PHURINE 5.0 09/30/2012 1812   GLUCOSEU NEGATIVE 09/30/2012 1812   HGBUR NEGATIVE 09/30/2012 1812   BILIRUBINUR NEGATIVE 09/30/2012 1812   KETONESUR NEGATIVE 09/30/2012 1812   PROTEINUR NEGATIVE 09/30/2012 1812   UROBILINOGEN 0.2 09/30/2012 1812   NITRITE NEGATIVE 09/30/2012 1812   LEUKOCYTESUR SMALL (A) 09/30/2012 1812   Sepsis Labs: @LABRCNTIP (procalcitonin:4,lacticidven:4) )No results found for this or any previous visit (from the past 240 hour(s)).   Radiological Exams on Admission: Ct Abdomen Pelvis Wo Contrast  Result Date: 09/26/2017 CLINICAL DATA:  Generalize weakness. Diarrhea for 2 weeks. Dizziness and near-syncope. EXAM: CT ABDOMEN AND PELVIS WITHOUT CONTRAST TECHNIQUE: Multidetector CT imaging of  the abdomen and pelvis was performed following the standard protocol without IV contrast. COMPARISON:  12/28/2013 FINDINGS: Lower chest: Dependent atelectasis and scarring in the lung bases. Fat in the fissures. Hepatobiliary: No focal liver abnormality is seen. Status post cholecystectomy. No  biliary dilatation. Pancreas: Unremarkable. No pancreatic ductal dilatation or surrounding inflammatory changes. Spleen: Normal in size without focal abnormality. Adrenals/Urinary Tract: Adrenal glands are unremarkable. Kidneys are normal, without renal calculi, focal lesion, or hydronephrosis. Bladder is unremarkable. Stomach/Bowel: Stomach, small bowel, and colon are mostly decompressed. Contrast material flows through to the rectum suggesting no evidence of obstruction. There is mild but diffuse wall thickening demonstrated in the colon predominantly involving the descending and rectosigmoid colon. This suggest colitis and may indicate an infectious colitis or inflammatory bowel disease such as ulcerative colitis. Appendix is not identified. Vascular/Lymphatic: Aortic atherosclerosis. No enlarged abdominal or pelvic lymph nodes. Reproductive: Status post hysterectomy. No adnexal masses. Other: No free air or free fluid in the abdomen. Abdominal wall musculature appears intact. Prominent visceral adipose tissue. Musculoskeletal: Compression deformities of T6, T7, and L1 vertebra with kyphoplasty changes at L1. Schmorl's node at the inferior endplate of L4. Degenerative changes in the spine with degenerative disc disease at L2-3 and L3-4 levels. IMPRESSION: Mild diffuse wall thickening in the colon predominantly involving the descending and rectosigmoid colon suggesting colitis and may indicate infectious colitis or inflammatory bowel disease. No evidence of bowel obstruction. Chronic appearing vertebral compression deformities. Prominent visceral adipose tissues. Electronically Signed   By: Lucienne Capers M.D.   On:  09/26/2017 01:16   Dg Chest 2 View  Result Date: 09/25/2017 CLINICAL DATA:  Initial evaluation for acute weakness for 2-3 weeks. EXAM: CHEST - 2 VIEW COMPARISON:  Prior radiograph from 07/12/2017 FINDINGS: Left-sided triple lead pacemaker/AICD in place, stable. Moderate cardiomegaly unchanged. Mediastinal silhouette within normal limits. Aortic atherosclerosis noted. Lungs mildly hypoinflated. No focal infiltrates. No pulmonary edema or pleural effusion. No pneumothorax. No acute osseous abnormality. Chronic compression deformity within the midthoracic spine is stable from previous. IMPRESSION: 1. No active cardiopulmonary disease. 2. Moderate cardiomegaly with left-sided pacemaker/AICD in place. No edema. Electronically Signed   By: Jeannine Boga M.D.   On: 09/25/2017 21:49    EKG: Independently reviewed.  Paced rhythm.  Assessment/Plan Principal Problem:   Colitis Active Problems:   Hypertension   ARF (acute renal failure) (HCC)   Diabetes mellitus type 2 in nonobese (HCC)   Chronic CHF (congestive heart failure) (HCC)   PAF (paroxysmal atrial fibrillation) ([facility])    1. Colitis involving the descending and rectosigmoid colon -differentials include ischemic versus infectious versus inflammatory.  Continue to hydrate and follow lactate levels.  Closely monitor respiratory status since patient has history of CHF.  Patient is on empiric antibiotics.  If there is any further diuresis check stool studies. 2. Acute renal failure likely from dehydration follow metabolic panel hold Lasix. 3. Paroxysmal atrial fibrillation -on metoprolol and Cardizem.  Patient's daughter states patient was recently taken off apixaban by patient's neurologist.  Cause not clear. 4. History of chronic systolic heart failure -as per patient's cardiology notes in May patient's EF is back to normal.  Closely monitor respiratory status patient is receiving fluids. 5. History of previous stroke. 6. Diabetes mellitus  type 2 we will keep patient on sliding scale coverage.  Patient was recently placed on prednisone tapering dose because of which is not clear.  Patient has finished the dose of prednisone 2 days ago.  I have reviewed patient's chart and care everywhere and reviewed all labs.   DVT prophylaxis: Lovenox. Code Status: Full code. Family Communication: Patient's daughter. Disposition Plan: Home. Consults called: None. Admission status: Observation.   Rise Patience MD [facility] Pager [phone].  If 7PM-7AM,  please contact night-coverage [email or web address] Password [facility]  09/26/2017, 5:28 AM

## 2017-09-26 NOTE — Progress Notes (Signed)
PROGRESS NOTE    Kristin Gomez  WCB:[id number] DOB: 12-22-1945 DOA: 09/25/2017 PCP: Raelyn Number, MD (Confirm with patient/family/NH records and if not entered, this HAS to be entered at [facility] point of entry. "No PCP" if truly none.)   Brief Narrative: (Start on day 1 of progress note - keep it brief and live) Kristin Gomez is a 72 y.o. female with history of chronic systolic heart failure status post biventricular pacemaker placement and AICD and as per the cardiology notes EF has come back to normal, stroke, history of paroxysmal atrial fibrillation presently not on anticoagulation was recently discontinued by neurologist, diabetes mellitus type 2 was brought to the ER after patient has benign weakness with diarrhea.  As per the patient's daughter over the last 2 weeks patient has been having persistent diarrhea after she eats anything.  Has not recently traveled or nodules any antibiotics.  Has some vague abdominal discomfort mostly in the right lower quadrant.  Denies any nausea vomiting but has benign poor appetite.  Denies chest pain shortness of breath or any falls.  ED Course: In the ER patient was mildly febrile with leukocytosis and elevated lactate.  CT scan of the abdomen shows left-sided colitis involving the descending and rectosigmoid area.  Patient was started on Azactam Flagyl due to allergies to other medications and hydration.  Admitted for further observation.   Assessment & Plan:  ## Ac  Colitis  -involving the descending and rectosigmoid colon  -differentials include ischemic versus infectious versus inflammatory. -  Continue to hydrate and follow lactate levels -.  Closely monitor respiratory status since patient has history of CHF.  - on aztreonam and flagyl - c. Diff, GI panel sent  ##Acute renal failure  -likely from dehydration follow metabolic panel hold Lasix. - cont with IVF  ##Paroxysmal atrial fibrillation -on metoprolol and Cardizem. -  Patient's  daughter states patient was recently taken off apixaban by patient's neurologist.  Cause not clear.  ##History of chronic systolic heart failure -as per patient's cardiology notes in May patient's EF is back to normal.  Closely monitor respiratory status patient is receiving fluids.  ##History of previous stroke.  ##Diabetes mellitus type 2 we will keep patient on sliding scale coverage.   DVT prophylaxis: (Lovenox/Heparin/SCD's/anticoagulated/None (if comfort care) Code Status: (Full/Partial - specify details) Family Communication: (Specify name, relationship & date discussed. NO "discussed with patient") Disposition Plan: (specify when and where you expect patient to be discharged). Include barriers to DC in this tab.    Antimicrobials:  Aztreonam 8/13- Flagyl 8/13  Subjective:  improvement with diarrhea from yesterday  Objective: Vitals:   09/26/17 0430 09/26/17 0445 09/26/17 0500 09/26/17 0554  BP: 136/79 128/84 (!) 141/82 (!) 152/98  Pulse: 91 81 91 88  Resp: 20 19 (!) 26 16  Temp:    98.5 F (36.9 C)  TempSrc:    Oral  SpO2: 95% 95% 95% 97%  Weight:      Height:        Intake/Output Summary (Last 24 hours) at 09/26/2017 1904 Last data filed at 09/26/2017 1700 Gross per 24 hour  Intake 2938.87 ml  Output -  Net 2938.87 ml   Filed Weights   09/25/17 2013  Weight: 67.1 kg    Examination:  General exam: Appears calm and comfortable  Respiratory system: Clear to auscultation. Respiratory effort normal. Cardiovascular system: S1 & S2 heard, RRR. No JVD, murmurs, rubs, gallops or clicks. No pedal edema. Gastrointestinal system: Abdomen is nondistended,  soft and nontender. No organomegaly or masses felt. Normal bowel sounds heard. Central nervous system: Alert and oriented. No focal neurological deficits. Extremities: Symmetric 5 x 5 power. Skin: No rashes, lesions or ulcers Psychiatry: Judgement and insight appear normal. Mood & affect appropriate.     Data  Reviewed: I have personally reviewed following labs and imaging studies  CBC: Recent Labs  Lab 09/25/17 2028 09/26/17 0621  WBC 13.9* 9.6  NEUTROABS 10.1* 7.3  HGB 16.5* 13.9  HCT 50.0* 41.3  MCV 97.8 97.6  PLT 196 875*   Basic Metabolic Panel: Recent Labs  Lab 09/25/17 2028 09/26/17 0621  NA 142 141  K 4.3 3.9  CL 107 111  CO2 22 21*  GLUCOSE 227* 127*  BUN 22 14  CREATININE 1.03* 0.82  CALCIUM 8.5* 7.2*  MG 2.1  --    GFR: Estimated Creatinine Clearance: 55.8 mL/min (by C-G formula based on SCr of 0.82 mg/dL). Liver Function Tests: Recent Labs  Lab 09/25/17 2028 09/26/17 0621  AST 20 24  ALT 31 24  ALKPHOS 67 60  BILITOT 1.0 1.4*  PROT 5.7* 4.2*  ALBUMIN 2.9* 2.2*   Recent Labs  Lab 09/25/17 2028  LIPASE 50   No results for input(s): AMMONIA in the last 168 hours. Coagulation Profile: No results for input(s): INR, PROTIME in the last 168 hours. Cardiac Enzymes: No results for input(s): CKTOTAL, CKMB, CKMBINDEX, TROPONINI in the last 168 hours. BNP (last 3 results) No results for input(s): PROBNP in the last 8760 hours. HbA1C: No results for input(s): HGBA1C in the last 72 hours. CBG: Recent Labs  Lab 09/26/17 0605 09/26/17 0836 09/26/17 1305 09/26/17 1804  GLUCAP 117* 122* 134* 133*   Lipid Profile: No results for input(s): CHOL, HDL, LDLCALC, TRIG, CHOLHDL, LDLDIRECT in the last 72 hours. Thyroid Function Tests: No results for input(s): TSH, T4TOTAL, FREET4, T3FREE, THYROIDAB in the last 72 hours. Anemia Panel: No results for input(s): VITAMINB12, FOLATE, FERRITIN, TIBC, IRON, RETICCTPCT in the last 72 hours. Sepsis Labs: Recent Labs  Lab 09/25/17 2037 09/25/17 2343 09/26/17 0621 09/26/17 1014  LATICACIDVEN 3.09* 2.95* 1.8 2.6*    No results found for this or any previous visit (from the past 240 hour(s)).       Radiology Studies: Ct Abdomen Pelvis Wo Contrast  Result Date: 09/26/2017 CLINICAL DATA:  Generalize weakness.  Diarrhea for 2 weeks. Dizziness and near-syncope. EXAM: CT ABDOMEN AND PELVIS WITHOUT CONTRAST TECHNIQUE: Multidetector CT imaging of the abdomen and pelvis was performed following the standard protocol without IV contrast. COMPARISON:  12/28/2013 FINDINGS: Lower chest: Dependent atelectasis and scarring in the lung bases. Fat in the fissures. Hepatobiliary: No focal liver abnormality is seen. Status post cholecystectomy. No biliary dilatation. Pancreas: Unremarkable. No pancreatic ductal dilatation or surrounding inflammatory changes. Spleen: Normal in size without focal abnormality. Adrenals/Urinary Tract: Adrenal glands are unremarkable. Kidneys are normal, without renal calculi, focal lesion, or hydronephrosis. Bladder is unremarkable. Stomach/Bowel: Stomach, small bowel, and colon are mostly decompressed. Contrast material flows through to the rectum suggesting no evidence of obstruction. There is mild but diffuse wall thickening demonstrated in the colon predominantly involving the descending and rectosigmoid colon. This suggest colitis and may indicate an infectious colitis or inflammatory bowel disease such as ulcerative colitis. Appendix is not identified. Vascular/Lymphatic: Aortic atherosclerosis. No enlarged abdominal or pelvic lymph nodes. Reproductive: Status post hysterectomy. No adnexal masses. Other: No free air or free fluid in the abdomen. Abdominal wall musculature appears intact. Prominent visceral adipose tissue.  Musculoskeletal: Compression deformities of T6, T7, and L1 vertebra with kyphoplasty changes at L1. Schmorl's node at the inferior endplate of L4. Degenerative changes in the spine with degenerative disc disease at L2-3 and L3-4 levels. IMPRESSION: Mild diffuse wall thickening in the colon predominantly involving the descending and rectosigmoid colon suggesting colitis and may indicate infectious colitis or inflammatory bowel disease. No evidence of bowel obstruction. Chronic appearing  vertebral compression deformities. Prominent visceral adipose tissues. Electronically Signed   By: Lucienne Capers M.D.   On: 09/26/2017 01:16   Dg Chest 2 View  Result Date: 09/25/2017 CLINICAL DATA:  Initial evaluation for acute weakness for 2-3 weeks. EXAM: CHEST - 2 VIEW COMPARISON:  Prior radiograph from 07/12/2017 FINDINGS: Left-sided triple lead pacemaker/AICD in place, stable. Moderate cardiomegaly unchanged. Mediastinal silhouette within normal limits. Aortic atherosclerosis noted. Lungs mildly hypoinflated. No focal infiltrates. No pulmonary edema or pleural effusion. No pneumothorax. No acute osseous abnormality. Chronic compression deformity within the midthoracic spine is stable from previous. IMPRESSION: 1. No active cardiopulmonary disease. 2. Moderate cardiomegaly with left-sided pacemaker/AICD in place. No edema. Electronically Signed   By: Jeannine Boga M.D.   On: 09/25/2017 21:49        Scheduled Meds: . atenolol  50 mg Oral BID  . diltiazem  180 mg Oral QHS  . donepezil  10 mg Oral QHS  . enoxaparin (LOVENOX) injection  40 mg Subcutaneous Q24H  . insulin aspart  0-9 Units Subcutaneous TID WC  . montelukast  10 mg Oral QHS  . pantoprazole  20 mg Oral Daily  . vitamin B-12  1,000 mcg Oral Daily   Continuous Infusions: . sodium chloride    . aztreonam Stopped (09/26/17 1040)  . metronidazole Stopped (09/26/17 1517)     LOS: 0 days    Time spent:  56 min    Katheline Brendlinger, MD [facility] Pager [phone]  If 7PM-7AM, please contact night-coverage [email or web address] Password [facility] 09/26/2017, 7:04 PM

## 2017-09-26 NOTE — Progress Notes (Signed)
Md paged for lactic acid 2.6

## 2017-09-26 NOTE — Progress Notes (Signed)
Pharmacy Antibiotic Note  Kristin Gomez is a 72 y.o. female admitted on 09/25/2017 with intra-abdominal infection.  Pharmacy has been consulted for Aztreonam dosing. WBC mildly elevated. Renal function OK. Lactic acid elevated.   Plan: Aztreonam 1g IV q8h Flagyl per MD Trend WBC, temp, renal function  F/U infectious work-up  Height: 5\' 5"  (165.1 cm) Weight: 148 lb (67.1 kg) IBW/kg (Calculated) : 57  Temp (24hrs), Avg:98.6 F (37 C), Min:97.9 F (36.6 C), Max:99.3 F (37.4 C)  Recent Labs  Lab 09/25/17 2028 09/25/17 2037 09/25/17 2343  WBC 13.9*  --   --   CREATININE 1.03*  --   --   LATICACIDVEN  --  3.09* 2.95*    Estimated Creatinine Clearance: 44.4 mL/min (A) (by C-G formula based on SCr of 1.03 mg/dL (H)).    Allergies  Allergen Reactions  . Aspirin Shortness Of Breath and Other (See Comments)    Patient states that this caused an asthma attack  . Azithromycin Anaphylaxis  . Infliximab Hives    remicade  . Methotrexate Hives  . Mushroom Extract Complex Anaphylaxis, Shortness Of Breath and Swelling  . Nsaids Shortness Of Breath and Other (See Comments)    Asthma attack  . Avelox [Moxifloxacin Hcl In Nacl] Swelling    Site of swelling not recalled  . Diovan [Valsartan] Swelling    Site of swelling not recalled  . Amoxicillin Rash  . Atorvastatin Swelling and Other (See Comments)    Exacerbated Psoriatic arthritis   . Contrast Media [Iodinated Diagnostic Agents] Rash  . Latex Rash  . Metformin Rash  . Metformin And Related Rash    ??  . Penicillins Rash    Has patient had a PCN reaction causing immediate rash, facial/tongue/throat swelling, SOB or lightheadedness with hypotension: Yes Has patient had a PCN reaction causing severe rash involving mucus membranes or skin necrosis: No Has patient had a PCN reaction that required hospitalization: No Has patient had a PCN reaction occurring within the last 10 years: No If all of the above answers are "NO", then  may proceed with Cephalosporin use.   Marland Kitchen Percocet [Oxycodone-Acetaminophen] Nausea Only  . Prednisone Rash    Can only be tolerated with Benadryl  . Propofol Rash  . Sulfa Antibiotics Nausea Only and Rash  . Vicodin [Hydrocodone-Acetaminophen] Nausea Only    Narda Bonds 09/26/2017 5:35 AM

## 2017-09-27 DIAGNOSIS — I10 Essential (primary) hypertension: Secondary | ICD-10-CM | POA: Diagnosis not present

## 2017-09-27 DIAGNOSIS — K529 Noninfective gastroenteritis and colitis, unspecified: Secondary | ICD-10-CM | POA: Diagnosis not present

## 2017-09-27 DIAGNOSIS — I480 Paroxysmal atrial fibrillation: Secondary | ICD-10-CM | POA: Diagnosis not present

## 2017-09-27 DIAGNOSIS — N179 Acute kidney failure, unspecified: Secondary | ICD-10-CM | POA: Diagnosis not present

## 2017-09-27 LAB — GLUCOSE, CAPILLARY
Glucose-Capillary: 137 mg/dL — ABNORMAL HIGH (ref 70–99)
Glucose-Capillary: 135 mg/dL — ABNORMAL HIGH (ref 70–99)

## 2017-09-27 MED ORDER — ATENOLOL 50 MG PO TABS: 50.0000 mg | ORAL_TABLET | Freq: Every day | ORAL | Status: DC

## 2017-09-27 MED ORDER — CEFUROXIME AXETIL 500 MG PO TABS: 500.0000 mg | ORAL_TABLET | Freq: Two times a day (BID) | ORAL | 0 refills | Status: AC

## 2017-09-27 MED ORDER — METRONIDAZOLE 500 MG PO TABS
500.0000 mg | ORAL_TABLET | Freq: Three times a day (TID) | ORAL | 0 refills | Status: AC
Start: 2017-09-27 — End: 2017-10-04

## 2017-09-27 NOTE — Discharge Summary (Signed)
Physician Discharge Summary  Kristin Gomez DPO:[id number] DOB: February 09, 1946 DOA: 09/25/2017  PCP: Raelyn Number, MD  Admit date: 09/25/2017 Discharge date: 09/27/2017  Time spent: 40 minutes  Recommendations for Outpatient Follow-up:  1. Follow-up with primary care physician in 1 week   Discharge Diagnoses:  Principal Problem:   Colitis Active Problems:   Hypertension   ARF (acute renal failure) (HCC)   Diabetes mellitus type 2 in nonobese (HCC)   Chronic CHF (congestive heart failure) (HCC)   PAF (paroxysmal atrial fibrillation) ([place])   Discharge Condition: Stable  Diet recommendation: Diabetic, cardiac diet  Filed Weights   09/25/17 2013  Weight: 67.1 kg    History of present illness and Hospital Course:  Kristin Gomez a 72 y.o.femalewithhistory of chronic systolic heart failure status post biventricular pacemaker placement and AICD and as per the cardiology notes EF has come back to normal, stroke, history of paroxysmal atrial fibrillation presently not on anticoagulation was recently discontinued by neurologist, diabetes mellitus type 2 was brought to the ER after patient has benign weakness with diarrhea for the last 2 weeks after eating any food was associated with vague abdominal discomfort in the left lower quadrant In the ER patient was mildly febrile with leukocytosis and elevated lactate. CT scan of the abdomen shows left-sided colitis involving the descending and rectosigmoid area. Patient was started on Azactam Flagyl due to allergies to other medications and hydration.  C. difficile toxin was negative WBC normalized. Pient had good improvement with the current antibiotic regimen.  Diarrhea resolved at the time of the discharge.  Patient is tolerating the diet well.  Patient was evaluated by physical therapy, recommended home health with physical therapy.  Patient was found to have mild renal insufficiency secondary to dehydration, improved with IV fluids,  holding Lasix.  Patient has history of paroxysmal atrial fibrillation, rate has been well controlled on metoprolol, Cardizem.  Patient has been off of Eliquis taken off by her neurologist of clear reason.  Patient was started back on home medications for diabetes mellitus.  Patient is recommended to follow-up with primary care physician in 1 week.  Patient is discharged on Ceftin and Flagyl for additional 7 days.  Assessment & Plan:  ## Ac  Colitis  -involving the descending and rectosigmoid colon -differentials include ischemic versus infectious versus inflammatory. - Continue to hydrate and follow lactate levels -. Closely monitor respiratory status since patient has history of CHF.  -on aztreonam and flagyl - c. Diff, GI panel sent  ##Acute renal failure  -likely from dehydration follow metabolic panel hold Lasix. - cont with IVF  ##Paroxysmal atrial fibrillation-on metoprolol and Cardizem. - Patient's daughter states patient was recently taken off apixaban by patient's neurologist. Cause not clear.  ##History of chronic systolic heart failure-as per patient's cardiology notes in May patient's EF is back to normal. Closely monitor respiratory status patient is receiving fluids.  ##History of previous stroke.  ##Diabetes mellitus type 2 we will keep patient on sliding scale coverage.    Discharge Exam: Vitals:   09/26/17 2048 09/27/17 0418  BP: 140/83 (!) 142/80  Pulse: 80 68  Resp: 18 18  Temp: 98.9 F (37.2 C) 98.9 F (37.2 C)  SpO2: 99% 97%    General: Alert oriented x3.  Not in distress Cardiovascular: S1-S2 regular no murmurs are heard Respiratory: Bilateral clear to auscultation Abdomen bowel sounds plus soft nontender nondistended  Discharge Instructions   Discharge Instructions    Diet - low sodium heart healthy  Complete by:  As directed    Increase activity slowly   Complete by:  As directed      Allergies as of 09/27/2017       Reactions   Aspirin Shortness Of Breath, Other (See Comments)   Patient states that this caused an asthma attack   Azithromycin Anaphylaxis   Infliximab Hives   remicade   Methotrexate Hives   Mushroom Extract Complex Anaphylaxis, Shortness Of Breath, Swelling   Nsaids Shortness Of Breath, Other (See Comments)   Asthma attack   Avelox [moxifloxacin Hcl In Nacl] Swelling   Site of swelling not recalled   Diovan [valsartan] Swelling   Site of swelling not recalled   Amoxicillin Rash   Atorvastatin Swelling, Other (See Comments)   Exacerbated Psoriatic arthritis    Contrast Media [iodinated Diagnostic Agents] Rash   Latex Rash   Metformin Rash   Metformin And Related Rash   ??   Penicillins Rash   Has patient had a PCN reaction causing immediate rash, facial/tongue/throat swelling, SOB or lightheadedness with hypotension: Yes Has patient had a PCN reaction causing severe rash involving mucus membranes or skin necrosis: No Has patient had a PCN reaction that required hospitalization: No Has patient had a PCN reaction occurring within the last 10 years: No If all of the above answers are "NO", then may proceed with Cephalosporin use.   Percocet [oxycodone-acetaminophen] Nausea Only   Prednisone Rash   Can only be tolerated with Benadryl   Propofol Rash   Sulfa Antibiotics Nausea Only, Rash   Vicodin [hydrocodone-acetaminophen] Nausea Only      Medication List    STOP taking these medications   furosemide 20 MG tablet Commonly known as:  LASIX   methylPREDNISolone 4 MG tablet Commonly known as:  MEDROL   montelukast 10 MG tablet Commonly known as:  SINGULAIR   ondansetron 4 MG disintegrating tablet Commonly known as:  ZOFRAN-ODT   ondansetron 4 MG tablet Commonly known as:  ZOFRAN   potassium chloride SA 20 MEQ tablet Commonly known as:  K-DUR,KLOR-CON     TAKE these medications   albuterol 108 (90 Base) MCG/ACT inhaler Commonly known as:  PROVENTIL HFA;VENTOLIN  HFA Inhale 2 puffs into the lungs every 6 (six) hours as needed for wheezing or shortness of breath.   atenolol 50 MG tablet Commonly known as:  TENORMIN Take 1 tablet (50 mg total) by mouth daily. What changed:  when to take this   B-12 COMPLIANCE INJECTION 1000 MCG/ML Kit Generic drug:  Cyanocobalamin Inject 1 mL into the muscle every 14 (fourteen) days. What changed:  Another medication with the same name was removed. Continue taking this medication, and follow the directions you see here.   cefUROXime 500 MG tablet Commonly known as:  CEFTIN Take 1 tablet (500 mg total) by mouth 2 (two) times daily for 7 days.   diltiazem 180 MG 24 hr capsule Commonly known as:  CARDIZEM CD Take 180 mg by mouth at bedtime.   donepezil 10 MG tablet Commonly known as:  ARICEPT Take 1 tablet (10 mg total) by mouth at bedtime.   glipiZIDE 5 MG 24 hr tablet Commonly known as:  GLUCOTROL XL Take 5 mg by mouth at bedtime.   ipratropium-albuterol 0.5-2.5 (3) MG/3ML Soln Commonly known as:  DUONEB Take 3 mLs by nebulization every 6 (six) hours as needed (for shortness of breath or wheezing).   lansoprazole 30 MG capsule Commonly known as:  PREVACID Take 30 mg by mouth  daily as needed (for reflux).   metroNIDAZOLE 500 MG tablet Commonly known as:  FLAGYL Take 1 tablet (500 mg total) by mouth 3 (three) times daily for 7 days.   VITAMIN D3 PO Take 2 capsules by mouth daily.      Allergies  Allergen Reactions  . Aspirin Shortness Of Breath and Other (See Comments)    Patient states that this caused an asthma attack  . Azithromycin Anaphylaxis  . Infliximab Hives    remicade  . Methotrexate Hives  . Mushroom Extract Complex Anaphylaxis, Shortness Of Breath and Swelling  . Nsaids Shortness Of Breath and Other (See Comments)    Asthma attack  . Avelox [Moxifloxacin Hcl In Nacl] Swelling    Site of swelling not recalled  . Diovan [Valsartan] Swelling    Site of swelling not recalled  .  Amoxicillin Rash  . Atorvastatin Swelling and Other (See Comments)    Exacerbated Psoriatic arthritis   . Contrast Media [Iodinated Diagnostic Agents] Rash  . Latex Rash  . Metformin Rash  . Metformin And Related Rash    ??  . Penicillins Rash    Has patient had a PCN reaction causing immediate rash, facial/tongue/throat swelling, SOB or lightheadedness with hypotension: Yes Has patient had a PCN reaction causing severe rash involving mucus membranes or skin necrosis: No Has patient had a PCN reaction that required hospitalization: No Has patient had a PCN reaction occurring within the last 10 years: No If all of the above answers are "NO", then may proceed with Cephalosporin use.   Marland Kitchen Percocet [Oxycodone-Acetaminophen] Nausea Only  . Prednisone Rash    Can only be tolerated with Benadryl  . Propofol Rash  . Sulfa Antibiotics Nausea Only and Rash  . Vicodin [Hydrocodone-Acetaminophen] Nausea Only      The results of significant diagnostics from this hospitalization (including imaging, microbiology, ancillary and laboratory) are listed below for reference.    Significant Diagnostic Studies: Ct Abdomen Pelvis Wo Contrast  Result Date: 09/26/2017 CLINICAL DATA:  Generalize weakness. Diarrhea for 2 weeks. Dizziness and near-syncope. EXAM: CT ABDOMEN AND PELVIS WITHOUT CONTRAST TECHNIQUE: Multidetector CT imaging of the abdomen and pelvis was performed following the standard protocol without IV contrast. COMPARISON:  12/28/2013 FINDINGS: Lower chest: Dependent atelectasis and scarring in the lung bases. Fat in the fissures. Hepatobiliary: No focal liver abnormality is seen. Status post cholecystectomy. No biliary dilatation. Pancreas: Unremarkable. No pancreatic ductal dilatation or surrounding inflammatory changes. Spleen: Normal in size without focal abnormality. Adrenals/Urinary Tract: Adrenal glands are unremarkable. Kidneys are normal, without renal calculi, focal lesion, or  hydronephrosis. Bladder is unremarkable. Stomach/Bowel: Stomach, small bowel, and colon are mostly decompressed. Contrast material flows through to the rectum suggesting no evidence of obstruction. There is mild but diffuse wall thickening demonstrated in the colon predominantly involving the descending and rectosigmoid colon. This suggest colitis and may indicate an infectious colitis or inflammatory bowel disease such as ulcerative colitis. Appendix is not identified. Vascular/Lymphatic: Aortic atherosclerosis. No enlarged abdominal or pelvic lymph nodes. Reproductive: Status post hysterectomy. No adnexal masses. Other: No free air or free fluid in the abdomen. Abdominal wall musculature appears intact. Prominent visceral adipose tissue. Musculoskeletal: Compression deformities of T6, T7, and L1 vertebra with kyphoplasty changes at L1. Schmorl's node at the inferior endplate of L4. Degenerative changes in the spine with degenerative disc disease at L2-3 and L3-4 levels. IMPRESSION: Mild diffuse wall thickening in the colon predominantly involving the descending and rectosigmoid colon suggesting colitis and may indicate  infectious colitis or inflammatory bowel disease. No evidence of bowel obstruction. Chronic appearing vertebral compression deformities. Prominent visceral adipose tissues. Electronically Signed   By: Lucienne Capers M.D.   On: 09/26/2017 01:16   Dg Chest 2 View  Result Date: 09/25/2017 CLINICAL DATA:  Initial evaluation for acute weakness for 2-3 weeks. EXAM: CHEST - 2 VIEW COMPARISON:  Prior radiograph from 07/12/2017 FINDINGS: Left-sided triple lead pacemaker/AICD in place, stable. Moderate cardiomegaly unchanged. Mediastinal silhouette within normal limits. Aortic atherosclerosis noted. Lungs mildly hypoinflated. No focal infiltrates. No pulmonary edema or pleural effusion. No pneumothorax. No acute osseous abnormality. Chronic compression deformity within the midthoracic spine is stable  from previous. IMPRESSION: 1. No active cardiopulmonary disease. 2. Moderate cardiomegaly with left-sided pacemaker/AICD in place. No edema. Electronically Signed   By: Jeannine Boga M.D.   On: 09/25/2017 21:49    Microbiology: Recent Results (from the past 240 hour(s))  C difficile quick scan w PCR reflex     Status: None   Collection Time: 09/26/17  7:18 PM  Result Value Ref Range Status   C Diff antigen NEGATIVE NEGATIVE Final   C Diff toxin NEGATIVE NEGATIVE Final   C Diff interpretation No C. difficile detected.  Final    Comment: Performed at [facility], [place] [address], [place], [place] [address]     Labs: Basic Metabolic Panel: Recent Labs  Lab 09/25/17 2028 09/26/17 0621  NA 142 141  K 4.3 3.9  CL 107 111  CO2 22 21*  GLUCOSE 227* 127*  BUN 22 14  CREATININE 1.03* 0.82  CALCIUM 8.5* 7.2*  MG 2.1  --    Liver Function Tests: Recent Labs  Lab 09/25/17 2028 09/26/17 0621  AST 20 24  ALT 31 24  ALKPHOS 67 60  BILITOT 1.0 1.4*  PROT 5.7* 4.2*  ALBUMIN 2.9* 2.2*   Recent Labs  Lab 09/25/17 2028  LIPASE 50   No results for input(s): AMMONIA in the last 168 hours. CBC: Recent Labs  Lab 09/25/17 2028 09/26/17 0621  WBC 13.9* 9.6  NEUTROABS 10.1* 7.3  HGB 16.5* 13.9  HCT 50.0* 41.3  MCV 97.8 97.6  PLT 196 147*   Cardiac Enzymes: No results for input(s): CKTOTAL, CKMB, CKMBINDEX, TROPONINI in the last 168 hours. BNP: BNP (last 3 results) No results for input(s): BNP in the last 8760 hours.  ProBNP (last 3 results) No results for input(s): PROBNP in the last 8760 hours.  CBG: Recent Labs  Lab 09/26/17 1305 09/26/17 1804 09/26/17 2050 09/27/17 0802 09/27/17 1215  GLUCAP 134* 133* 172* 137* 135*       Signed:  Dejay Kronk MD.  [facility] 09/27/2017, 2:17 PM

## 2017-09-27 NOTE — Progress Notes (Signed)
Discharge home. Home discharge instruction given, no question verbalized. 

## 2017-09-27 NOTE — Evaluation (Signed)
Physical Therapy Evaluation Patient Details Name: Kristin Gomez MRN: [id number] DOB: Sep 22, 1945 Today's Date: 09/27/2017   History of Present Illness  72 y.o. female admitted on 09/25/17 for weakness and diarrhea.  Pt dx with acute colitis, acute renal failure (likely due to dehydration).  Pt with other significant PMH of DM2, stroke (affected her memory), psoriatic arthritis, pacemaker, MI, HTN, fibromyalgia, heart failure, and L knee arthroscopy.  Clinical Impression  Pt is generally weak and deconditioned from acute illness.  She needs very light hand held assist for increased confidence during gait and fatigues easily.  Pt would benefit from [facility] therapy follow up to help ensure efficient return to baseline.   PT to follow acutely for deficits listed below.       Follow Up Recommendations Home health PT;Supervision for mobility/OOB(for the first few days (through the weekend))    Equipment Recommendations  None recommended by PT    Recommendations for Other Services   None    Precautions / Restrictions    NA     Mobility  Bed Mobility Overal bed mobility: Modified Independent                Transfers Overall transfer level: Needs assistance Equipment used: 1 person hand held assist Transfers: Sit to/from Stand Sit to Stand: Min guard         General transfer comment: min guard assist for safety, verbal cues for safety (stand for a minute before going to walk, sit before that EOB).   Ambulation/Gait Ambulation/Gait assistance: Min guard Gait Distance (Feet): 120 Feet Assistive device: 1 person hand held assist Gait Pattern/deviations: Step-through pattern;Staggering left;Staggering right Gait velocity: decreased Gait velocity interpretation: 1.31 - 2.62 ft/sec, indicative of limited community ambulator General Gait Details: Pt with mildly staggering gait pattern, took one standing rest break at the halfway point due to fatigue.  VSS throughout. Pt reporting some  mild lightheadedness.          Balance Overall balance assessment: Needs assistance Sitting-balance support: Feet supported;No upper extremity supported Sitting balance-Leahy Scale: Good     Standing balance support: Bilateral upper extremity supported;No upper extremity supported;Single extremity supported Standing balance-Leahy Scale: Fair                               Pertinent Vitals/Pain Pain Assessment: No/denies pain    Home Living Family/patient expects to be discharged to:: Private residence Living Arrangements: Children(daughter who works) Available Help at Discharge: Family;Available 24 hours/day(until monday when daughter returns to work) Type of Home: House Home Access: [place]: One [place]: Environmental consultant - 2 wheels;Bedside commode;Shower seat      Prior Function Level of Independence: Independent         Comments: Pt has not driven since her stroke, but is otherwise independent, likes to mow the grass.        Extremity/Trunk Assessment   Upper Extremity Assessment Upper Extremity Assessment: Generalized weakness    Lower Extremity Assessment Lower Extremity Assessment: Generalized weakness    Cervical / Trunk Assessment Cervical / Trunk Assessment: Normal  Communication   Communication: No difficulties  Cognition Arousal/Alertness: Awake/alert Behavior During Therapy: WFL for tasks assessed/performed Overall Cognitive Status: History of cognitive impairments - at baseline  General Comments: Pt unable to report accurate month, year, but otherwise, functionally intact             Assessment/Plan    PT Assessment Patient needs continued PT services  PT Problem List Decreased strength;Decreased activity tolerance;Decreased balance;Decreased mobility       PT Treatment Interventions DME instruction;Gait training;Functional mobility  training;Therapeutic activities;Therapeutic exercise;Balance training;Neuromuscular re-education;Cognitive remediation;Patient/family education    PT Goals (Current goals can be found in the Care Plan section)  Acute Rehab PT Goals Patient Stated Goal: to go home, feel better, get stronger PT Goal Formulation: With patient Time For Goal Achievement: 10/11/17 Potential to Achieve Goals: Good    Frequency Min 3X/week           AM-PAC PT "6 Clicks" Daily Activity  Outcome Measure Difficulty turning over in bed (including adjusting bedclothes, sheets and blankets)?: None Difficulty moving from lying on back to sitting on the side of the bed? : None Difficulty sitting down on and standing up from a chair with arms (e.g., wheelchair, bedside commode, etc,.)?: Unable Help needed moving to and from a bed to chair (including a wheelchair)?: A Little Help needed walking in hospital room?: A Little Help needed climbing 3-5 steps with a railing? : A Little 6 Click Score: 18    End of Session   Activity Tolerance: Patient limited by fatigue Patient left: in bed;with call bell/phone within reach;with family/visitor present Nurse Communication: Mobility status PT Visit Diagnosis: Muscle weakness (generalized) (M62.81);Difficulty in walking, not elsewhere classified (R26.2)    Time: 2355-7322 PT Time Calculation (min) (ACUTE ONLY): 36 min   Charges:          Wells Guiles B. Toula Miyasaki, PT, DPT [phone]   PT Evaluation $PT Eval Moderate Complexity: 1 Mod PT Treatments $Gait Training: 8-22 mins        09/27/2017, 3:35 PM

## 2017-09-27 NOTE — Care Management Note (Signed)
Case Management Note  Patient Details  Name: NEYSHA CRIADO MRN: [id number] Date of Birth: April 28, 1945  Subjective/Objective:                    Action/Plan:  Discussed discharge planning with patient at bedside, with patient and daughter [name]. Face sheet address confirmed. Patient's cell is [phone], daughter [name] cell is [phone]. Patient and [name] live together. Patient can get prescriptions filled and has transportation.   Patient wants [facility] [place] same spoke to [facility] fax [phone]. Referral accepted by [facility]. Clinicals and orders faxed.  Patient already has walker and 3 in 1 at home. Expected Discharge Date:  09/27/17               Expected Discharge Plan:  [place]  In-House Referral:     Discharge planning Services  CM Consult  Post Acute Care Choice:  Home Health Choice offered to:  Patient, Adult Children  DME Arranged:  N/A DME Agency:  NA  HH Arranged:  PT, OT HH Agency:  [facility]  Status of Service:  Completed, signed off  If discussed at [place] of Stay Meetings, dates discussed:    Additional Comments:  Marilu Favre, RN 09/27/2017, 1:32 PM

## 2017-11-06 ENCOUNTER — Telehealth: Payer: Self-pay

## 2017-11-06 NOTE — Telephone Encounter (Signed)
SENT REFERRAL TO SCHEDULING AND FILED NOTES 

## 2017-11-07 ENCOUNTER — Telehealth: Payer: Self-pay

## 2017-11-07 NOTE — Telephone Encounter (Signed)
Sent referral to scheduling and filed notes 

## 2017-11-09 ENCOUNTER — Encounter: Payer: Self-pay | Admitting: Internal Medicine

## 2017-11-28 ENCOUNTER — Encounter: Payer: Self-pay | Admitting: Internal Medicine

## 2017-11-28 ENCOUNTER — Ambulatory Visit (INDEPENDENT_AMBULATORY_CARE_PROVIDER_SITE_OTHER): Payer: Medicare HMO | Admitting: Internal Medicine

## 2017-11-28 VITALS — BP 102/56 | HR 79 | Ht 65.0 in | Wt 146.8 lb

## 2017-11-28 DIAGNOSIS — R609 Edema, unspecified: Secondary | ICD-10-CM

## 2017-11-28 DIAGNOSIS — I509 Heart failure, unspecified: Secondary | ICD-10-CM

## 2017-11-28 NOTE — Patient Instructions (Signed)
Medication Instructions:  Your physician recommends that you continue on your current medications as directed. Please refer to the Current Medication list given to you today.  If you need a refill on your cardiac medications before your next appointment, please call your pharmacy.   Lab work: TODAY (LIPIDS, CMET, BNP, TSH, SED RATE) If you have labs (blood work) drawn today and your tests are completely normal, you will receive your results only by: Marland Kitchen MyChart Message (if you have MyChart) OR . A paper copy in the mail If you have any lab test that is abnormal or we need to change your treatment, we will call you to review the results.  Testing/Procedures: Your physician has requested that you have an echocardiogram. Echocardiography is a painless test that uses sound waves to create images of your heart. It provides your doctor with information about the size and shape of your heart and how well your heart's chambers and valves are working. This procedure takes approximately one hour. There are no restrictions for this procedure.    Follow-Up:  Follow up with Dr. Harrington Challenger will depend on test results.  Any Other Special Instructions Will Be Listed Below (If Applicable). You have been referred to Electrophysiology.  Please schedule an appointment today at checkout.

## 2017-11-28 NOTE — Progress Notes (Addendum)
Cardiology Office Note   Date:  11/28/2017   ID:  Kristin Gomez, DOB 03-22-1945, MRN [id number]  PCP:  Raelyn Number, MD  Cardiologist:   Dorris Carnes, MD   The pt is new  REferred by Dr Jannette Fogo for evaluation of pericardial effusion   History of Present Illness: Kristin Gomez is a 72 y.o. female with a history of HTN, PPM, CHF, COPD, DM Type II, HL    She is followed by Dr Jannette Fogo   Echo done showed pericardial effusion   Took Prednisone dose pack   Last seen in IM in [place]  The patient says she feels bad  Nauseaated today  No vomitity Activity is limited  Tired   Denies CP         Current Meds  Medication Sig  . albuterol (PROVENTIL HFA;VENTOLIN HFA) 108 (90 Base) MCG/ACT inhaler Inhale 2 puffs into the lungs every 6 (six) hours as needed for wheezing or shortness of breath.   Marland Kitchen atenolol (TENORMIN) 50 MG tablet Take 1 tablet (50 mg total) by mouth daily.  . Cholecalciferol (VITAMIN D3 PO) Take 2 capsules by mouth daily.   . Cyanocobalamin (B-12 COMPLIANCE INJECTION) 1000 MCG/ML KIT Inject 1 mL into the muscle every 14 (fourteen) days.   Marland Kitchen diltiazem (CARDIZEM CD) 180 MG 24 hr capsule Take 180 mg by mouth at bedtime.   . furosemide (LASIX) 20 MG tablet Take 20 mg by mouth every morning.  Marland Kitchen glipiZIDE (GLUCOTROL XL) 5 MG 24 hr tablet Take 5 mg by mouth at bedtime.  Marland Kitchen ipratropium-albuterol (DUONEB) 0.5-2.5 (3) MG/3ML SOLN Take 3 mLs by nebulization every 6 (six) hours as needed (for shortness of breath or wheezing).   . lansoprazole (PREVACID) 30 MG capsule Take 30 mg by mouth daily as needed (for reflux).   . Ondansetron HCl (ZOFRAN PO) Take 1 tablet by mouth daily as needed (nausea).  . potassium chloride SA (K-DUR,KLOR-CON) 20 MEQ tablet Take 20 mEq by mouth daily.     Allergies:   Aspirin; Azithromycin; Infliximab; Methotrexate; Mushroom extract complex; Nsaids; Avelox [moxifloxacin hcl in nacl]; Diovan [valsartan]; Amoxicillin; Atorvastatin; Contrast media [iodinated  diagnostic agents]; Latex; Metformin; Metformin and related; Penicillins; Percocet [oxycodone-acetaminophen]; Prednisone; Propofol; Sulfa antibiotics; and Vicodin [hydrocodone-acetaminophen]   Past Medical History:  Diagnosis Date  . Arthritis    "all over" (09/26/2017)  . Asthma   . Diabetes mellitus   . Fibromyalgia   . Heart disease   . High cholesterol   . History of blood transfusion    "related to a surgery" (09/26/2017)  . Hypertension   . Memory difficulty 07/26/2017  . Myocardial infarction [facility])    "years ago" (09/26/2017)  . Pacemaker   . 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 Stroke [facility]) 02/2017   "affected my memory" (09/26/2017)  . Type II diabetes mellitus ([place])     Past Surgical History:  Procedure Laterality Date  . CARDIAC CATHETERIZATION    . CATARACT EXTRACTION W/ INTRAOCULAR LENS  IMPLANT, BILATERAL Bilateral   . CHOLECYSTECTOMY    . COLONOSCOPY    . I&D EXTREMITY  03/31/2011   Procedure: IRRIGATION AND DEBRIDEMENT EXTREMITY;  Surgeon: Tennis Must, MD;  Location: [place];  Service: Orthopedics;  Laterality: Left;  left long finger  . INSERT / REPLACE / REMOVE PACEMAKER    . KNEE ARTHROSCOPY Left   . MASS EXCISION  03/08/2011   Procedure: EXCISION MASS;  Surgeon: Tennis Must, MD;  Location: [place];  Service: Orthopedics;  Laterality: Left;  Excision Mass Left Long Finger and Debridement of Distal Interphalangeal  Joint  . TONSILLECTOMY    . TOTAL ABDOMINAL HYSTERECTOMY    . TUBAL LIGATION       Social History:  The patient  reports that she has never smoked. She has never used smokeless tobacco. She reports that she does not drink alcohol or use drugs.   Family History:  The patient's family history includes Alzheimer's disease in her mother; Diabetes in her brother; Heart attack in her father.    ROS:  Please see the history of present illness. All other systems are reviewed and  Negative to the above  problem except as noted.    PHYSICAL EXAM: VS:  BP (!) 102/56   Pulse 79   Ht 5' 5"  (1.651 m)   Wt 146 lb 12.8 oz (66.6 kg)   SpO2 98%   BMI 24.43 kg/m   GEN: Well nourished, well developed, in no acute distress  HEENT: normal  Neck: neck is full   No , carotid bruits, or masses Cardiac: RRR; no murmurs, rubs, or gallops,1+ edema  Respiratory:  clear to auscultation bilaterally, normal work of breathing GI: soft, nontender, nondistended, + BS  No hepatomegaly  MS: no deformity Moving all extremities   Skin: warm and dry, no rash   EKG:  EKG is ordered today.   Lipid Panel No results found for: CHOL, TRIG, HDL, CHOLHDL, VLDL, LDLCALC, LDLDIRECT    Wt Readings from Last 3 Encounters:  11/28/17 146 lb 12.8 oz (66.6 kg)  09/25/17 148 lb (67.1 kg)  07/26/17 150 lb 8 oz (68.3 kg)      ASSESSMENT AND PLAN:  Patient is a very difficult historian     1  Pericardial effusion    WIll get echo to define   Check TSH and ESR   2  Hx CHF  Hx of systolic CHF Edema on exam  S/p BiV PPM with ICD   LVEF normalized    Get echo to redefine LVEF   Will check CMET and TSH    BNP     COunselled on low salt  3  Hx PAF   Need to review  Per d/c summary  from Aug has been taken off of Eliquis by neuro       Current medicines are reviewed at length with the patient today.  The patient does not have concerns regarding medicines.  Signed, Dorris Carnes, MD  11/28/2017 11:11 AM    [address], [place], [place]  [address] Phone: [phone]; Fax: [phone]

## 2017-11-29 ENCOUNTER — Encounter: Payer: Self-pay | Admitting: Internal Medicine

## 2017-11-29 ENCOUNTER — Ambulatory Visit (INDEPENDENT_AMBULATORY_CARE_PROVIDER_SITE_OTHER): Payer: Medicare HMO | Admitting: Internal Medicine

## 2017-11-29 VITALS — BP 124/72 | HR 80 | Ht 65.0 in | Wt 143.2 lb

## 2017-11-29 DIAGNOSIS — I509 Heart failure, unspecified: Secondary | ICD-10-CM

## 2017-11-29 DIAGNOSIS — Z950 Presence of cardiac pacemaker: Secondary | ICD-10-CM | POA: Diagnosis not present

## 2017-11-29 LAB — LIPID PANEL
Chol/HDL Ratio: 5.1 ratio — ABNORMAL HIGH (ref 0.0–4.4)
Cholesterol, Total: 265 mg/dL — ABNORMAL HIGH (ref 100–199)
HDL: 52 mg/dL (ref >39)
LDL CALC: 144 mg/dL — AB (ref 0–99)
Triglycerides: 347 mg/dL — ABNORMAL HIGH (ref 0–149)
VLDL Cholesterol Cal: 69 mg/dL — ABNORMAL HIGH (ref 5–40)

## 2017-11-29 LAB — COMPREHENSIVE METABOLIC PANEL
A/G RATIO: 1.8 (ref 1.2–2.2)
ALT: 14 IU/L (ref 0–32)
AST: 12 IU/L (ref 0–40)
Albumin: 3.6 g/dL (ref 3.5–4.8)
Alkaline Phosphatase: 71 IU/L (ref 39–117)
BUN/Creatinine Ratio: 17 (ref 12–28)
BUN: 19 mg/dL (ref 8–27)
Bilirubin Total: 0.4 mg/dL (ref 0.0–1.2)
CALCIUM: 8.6 mg/dL — AB (ref 8.7–10.3)
CO2: 21 mmol/L (ref 20–29)
Chloride: 103 mmol/L (ref 96–106)
Creatinine, Ser: 1.12 mg/dL — ABNORMAL HIGH (ref 0.57–1.00)
GFR, EST AFRICAN AMERICAN: 57 mL/min/{1.73_m2} — AB (ref >59)
GFR, EST NON AFRICAN AMERICAN: 49 mL/min/{1.73_m2} — AB (ref >59)
Globulin, Total: 2 g/dL (ref 1.5–4.5)
Glucose: 118 mg/dL — ABNORMAL HIGH (ref 65–99)
POTASSIUM: 4.5 mmol/L (ref 3.5–5.2)
Sodium: 143 mmol/L (ref 134–144)
Total Protein: 5.6 g/dL — ABNORMAL LOW (ref 6.0–8.5)

## 2017-11-29 LAB — PRO B NATRIURETIC PEPTIDE: NT-Pro BNP: 452 pg/mL — ABNORMAL HIGH (ref 0–301)

## 2017-11-29 LAB — TSH: TSH: 4.41 u[IU]/mL (ref 0.450–4.500)

## 2017-11-29 LAB — SEDIMENTATION RATE: Sed Rate: 19 mm/hr (ref 0–40)

## 2017-11-29 NOTE — Progress Notes (Signed)
ELECTROPHYSIOLOGY CONSULT NOTE  Patient ID: Kristin Gomez, MRN: [id number], DOB/AGE: 72-20-1947 72 y.o. Admit date: (Not on file) Date of Consult: 11/29/2017  Primary Physician: Raelyn Number, MD Primary Cardiologist: PR    Kristin Gomez is a 72 y.o. female who is being seen today for the evaluation of device followup at the request of DR Jannette Fogo.    HPI Kristin Gomez is a 72 y.o. female referred for device follow-up.  Previously seen at [facility] by Dr. Minna Merritts for many years.  She was seen yesterday by Dr. Ysidro Evert because of concerns raised by her PCP regarding the pericardial effusion.  An echocardiogram is ordered for later this week  She has a history of a nonischemic cardiomyopathy the mechanism of which is not clear.  Wondering to the chart I wonder whether was pacemaker mediated.  She underwent its CRT upgrade at some point between 2008 and 08/2015 with interval normalization of LV function.     She was previously treated with apixaban but this was discontinued.  She is poorly able to give a history because of a memory deficit that began rather acutely 1/19, thought to be residual from a stroke  DATE TEST EF   5/19 Echo   60-65 %          g   Thromboembolic risk factors ( age -4, HTN-1, TIA/CVA-2, DM-1, CHF-1, Gender-1) for a CHADSVASc Score of =>7 she is anticoagulated with apixaban.    Past Medical History:  Diagnosis Date  . Arthritis    "all over" (09/26/2017)  . Asthma   . Diabetes mellitus   . Fibromyalgia   . Heart disease   . High cholesterol   . History of blood transfusion    "related to a surgery" (09/26/2017)  . Hypertension   . Memory difficulty 07/26/2017  . Myocardial infarction [facility])    "years ago" (09/26/2017)  . Pacemaker   . 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 Stroke [facility]) 02/2017   "affected my memory" (09/26/2017)  . Type II diabetes mellitus ([place])       Surgical History:  Past Surgical History:  Procedure Laterality  Date  . CARDIAC CATHETERIZATION    . CATARACT EXTRACTION W/ INTRAOCULAR LENS  IMPLANT, BILATERAL Bilateral   . CHOLECYSTECTOMY    . COLONOSCOPY    . I&D EXTREMITY  03/31/2011   Procedure: IRRIGATION AND DEBRIDEMENT EXTREMITY;  Surgeon: Tennis Must, MD;  Location: [place];  Service: Orthopedics;  Laterality: Left;  left long finger  . INSERT / REPLACE / REMOVE PACEMAKER    . KNEE ARTHROSCOPY Left   . MASS EXCISION  03/08/2011   Procedure: EXCISION MASS;  Surgeon: Tennis Must, MD;  Location: [place];  Service: Orthopedics;  Laterality: Left;  Excision Mass Left Long Finger and Debridement of Distal Interphalangeal  Joint  . TONSILLECTOMY    . TOTAL ABDOMINAL HYSTERECTOMY    . TUBAL LIGATION       Home Meds: Current Meds  Medication Sig  . albuterol (PROVENTIL HFA;VENTOLIN HFA) 108 (90 Base) MCG/ACT inhaler Inhale 2 puffs into the lungs every 6 (six) hours as needed for wheezing or shortness of breath.   Marland Kitchen atenolol (TENORMIN) 50 MG tablet Take 1 tablet (50 mg total) by mouth daily.  . Cholecalciferol (VITAMIN D3 PO) Take 2 capsules by mouth daily.   . Cyanocobalamin (B-12 COMPLIANCE INJECTION) 1000 MCG/ML KIT Inject 1 mL into the muscle  every 14 (fourteen) days.   Marland Kitchen diltiazem (CARDIZEM CD) 180 MG 24 hr capsule Take 180 mg by mouth at bedtime.   . furosemide (LASIX) 20 MG tablet Take 20 mg by mouth every morning.  Marland Kitchen glipiZIDE (GLUCOTROL XL) 5 MG 24 hr tablet Take 5 mg by mouth at bedtime.  Marland Kitchen ipratropium-albuterol (DUONEB) 0.5-2.5 (3) MG/3ML SOLN Take 3 mLs by nebulization every 6 (six) hours as needed (for shortness of breath or wheezing).   . lansoprazole (PREVACID) 30 MG capsule Take 30 mg by mouth daily as needed (for reflux).   . Ondansetron HCl (ZOFRAN PO) Take 1 tablet by mouth daily as needed (nausea).  . potassium chloride SA (K-DUR,KLOR-CON) 20 MEQ tablet Take 20 mEq by mouth daily.    Allergies:  Allergies  Allergen Reactions  . Aspirin  Shortness Of Breath and Other (See Comments)    Patient states that this caused an asthma attack Patient states that is causes an asthma attack.  . Azithromycin Anaphylaxis  . Infliximab Hives    remicade  . Methotrexate Hives  . Mushroom Extract Complex Anaphylaxis, Shortness Of Breath and Swelling  . Nsaids Shortness Of Breath and Other (See Comments)    Asthma attack  . Avelox [Moxifloxacin Hcl In Nacl] Swelling    Site of swelling not recalled  . Diovan [Valsartan] Swelling    Site of swelling not recalled  . Amoxicillin Rash  . Atorvastatin Swelling and Other (See Comments)    Exacerbated Psoriatic arthritis   . Contrast Media [Iodinated Diagnostic Agents] Rash  . Latex Rash  . Metformin Rash  . Metformin And Related Rash    ??  . Penicillins Rash    Has patient had a PCN reaction causing immediate rash, facial/tongue/throat swelling, SOB or lightheadedness with hypotension: Yes Has patient had a PCN reaction causing severe rash involving mucus membranes or skin necrosis: No Has patient had a PCN reaction that required hospitalization: No Has patient had a PCN reaction occurring within the last 10 years: No If all of the above answers are "NO", then may proceed with Cephalosporin use.   Marland Kitchen Percocet [Oxycodone-Acetaminophen] Nausea Only  . Prednisone Rash    Can only be tolerated with Benadryl  . Propofol Rash  . Sulfa Antibiotics Nausea Only and Rash  . Vicodin [Hydrocodone-Acetaminophen] Nausea Only    Social History   Socioeconomic History  . Marital status: Widowed    Spouse name: Not on file  . Number of children: 2  . Years of education: College  . Highest education level: Not on file  Occupational History  . Not on file  Social Needs  . Financial resource strain: Not on file  . Food insecurity:    Worry: Not on file    Inability: Not on file  . Transportation needs:    Medical: Not on file    Non-medical: Not on file  Tobacco Use  . Smoking status:  Never Smoker  . Smokeless tobacco: Never Used  Substance and Sexual Activity  . Alcohol use: Never    Frequency: Never  . Drug use: Never  . Sexual activity: Not Currently  Lifestyle  . Physical activity:    Days per week: Not on file    Minutes per session: Not on file  . Stress: Not on file  Relationships  . Social connections:    Talks on phone: Not on file    Gets together: Not on file    Attends religious service: Not on file  Active member of club or organization: Not on file    Attends meetings of clubs or organizations: Not on file    Relationship status: Not on file  . Intimate partner violence:    Fear of current or ex partner: Not on file    Emotionally abused: Not on file    Physically abused: Not on file    Forced sexual activity: Not on file  Other Topics Concern  . Not on file  Social History Narrative   Lives with daughter   Caffeine use: diet drinks   diet coke out to eat   Rig thhanded      Family History  Problem Relation Age of Onset  . Alzheimer's disease Mother   . Heart attack Father   . Diabetes Brother      ROS:  Please see the history of present illness.     All other systems reviewed and negative.    Physical Exam: Blood pressure 124/72, pulse 80, height 5' 5"  (1.651 m), weight 143 lb 3.2 oz (65 kg), SpO2 93 %. General: Well developed, well nourished female in no acute distress. Head: Normocephalic, atraumatic, sclera non-icteric, no xanthomas, nares are without discharge. EENT: normal  Lymph Nodes:  none Neck: Negative for carotid bruits. JVD 8-9 Back:without scoliosis kyphosis Lungs: Clear bilaterally to auscultation without wheezes, rales, or rhonchi. Breathing is unlabored. Heart: RRR with S1 S2.  2/6 systolic murmur . No rubs, or gallops appreciated. Abdomen: Soft, non-tender, non-distended with normoactive bowel sounds. No hepatomegaly. No rebound/guarding. No obvious abdominal masses. Msk:  Strength and tone appear normal for  age. Extremities: No clubbing or cyanosis trace left greater than right edema.  Distal pedal pulses are 2+ and equal bilaterally.  Swelling of the hands bilaterally  skin: Warm and Dry Neuro: Alert and oriented X 3. CN III-XII intact Grossly normal sensory and motor function . Psych:  Responds to questions appropriately with a normal affect.      Labs: Cardiac Enzymes No results for input(s): CKTOTAL, CKMB, TROPONINI in the last 72 hours. CBC Lab Results  Component Value Date   WBC 9.6 09/26/2017   HGB 13.9 09/26/2017   HCT 41.3 09/26/2017   MCV 97.6 09/26/2017   PLT 147 (L) 09/26/2017   PROTIME: No results for input(s): LABPROT, INR in the last 72 hours. Chemistry  Recent Labs  Lab 11/28/17 1148  NA 143  K 4.5  CL 103  CO2 21  BUN 19  CREATININE 1.12*  CALCIUM 8.6*  PROT 5.6*  BILITOT 0.4  ALKPHOS 71  ALT 14  AST 12  GLUCOSE 118*   Lipids Lab Results  Component Value Date   CHOL 265 (H) 11/28/2017   HDL 52 11/28/2017   LDLCALC 144 (H) 11/28/2017   TRIG 347 (H) 11/28/2017   BNP NT-Pro BNP  Date/Time Value Ref Range Status  11/28/2017 11:48 AM 452 (H) 0 - 301 pg/mL Final    Comment:    The following cut-points have been suggested for the use of proBNP for the diagnostic evaluation of heart failure (HF) in patients with acute dyspnea: Modality                     Age           Optimal Cut                            (years)  Point ------------------------------------------------------ Diagnosis (rule in HF)        <50            450 pg/mL                           50 - 75            900 pg/mL                               >75           1800 pg/mL Exclusion (rule out HF)  Age independent     300 pg/mL    Thyroid Function Tests: Recent Labs    11/28/17 1148  TSH 4.410   Miscellaneous No results found for: DDIMER  Radiology/Studies:  No results found.  EKG: sinus w P-synchronous/ AV  pacing 16/12/44   Assessment and Plan:   NICM  CRT-P  Afib  RVR  1.7 %  Over all   Anticoagulation off apixoban   Rheumatoid arthritis  Pericardial effusion?  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  Left ventricular pacing thresholds were high.  Chest x-ray was reviewed.  Only the distal tip is in the coronary sinus but with this location the QRS duration is amazing 122 ms.  We have reprogramme LV tip-can--->> tip-coil with a decrease in threshold from 3.5--2.5 V at 1.0 ms  Her stroke risk is exceedingly high.  I recommended that she resume apixaban.  They have been dissuaded from anticoagulation because of the apparent attitude of the prescribing physician.  I suggested that they find a physician that they trust and review with that person role of anticoagulation with her CHADS-VASc score 7  There is also the issue of her pericardial effusion which needs to be investigated.  Echocardiogram is pending.  In the event that she elects to transfer her care here, we will request her [place].     Virl Axe

## 2017-11-29 NOTE — Patient Instructions (Addendum)
Medication Instructions:  Your physician recommends that you continue on your current medications as directed. Please refer to the Current Medication list given to you today.  We recommend you begin an anticoagulant. Please call us to discuss further if you are interested.  Labwork: None ordered.  Testing/Procedures: None ordered.  Follow-Up: Your physician recommends that you schedule a follow-up appointment in:   12 months with Dr Caryl Comes  Any Other Special Instructions Will Be Listed Below (If Applicable).     If you need a refill on your cardiac medications before your next appointment, please call your pharmacy.

## 2017-12-01 ENCOUNTER — Other Ambulatory Visit: Payer: Self-pay

## 2017-12-01 ENCOUNTER — Ambulatory Visit (HOSPITAL_COMMUNITY): Payer: Medicare HMO | Attending: Cardiology

## 2017-12-01 DIAGNOSIS — R609 Edema, unspecified: Secondary | ICD-10-CM | POA: Insufficient documentation

## 2017-12-01 DIAGNOSIS — I509 Heart failure, unspecified: Secondary | ICD-10-CM | POA: Insufficient documentation

## 2017-12-05 ENCOUNTER — Telehealth: Payer: Self-pay | Admitting: *Deleted

## 2017-12-05 MED ORDER — CARVEDILOL 6.25 MG PO TABS: 6.2500 mg | ORAL_TABLET | Freq: Two times a day (BID) | ORAL | 3 refills | Status: DC

## 2017-12-05 NOTE — Telephone Encounter (Addendum)
Results from labs and echo recording below. I called pts daughter, [name] in reference to this. Reviewed labs. Re: Crestor--dtr states pt has always had high cholesterol  It runs in her family  She can't take any of the medicines because it causes her fibromyalgia and psoriatic arthritis to flare.  Unsure if she has ever tried Crestor.  Will talk to family doctor and will cb to let me know.  Reviewed echo results/notes from Dr. Harrington Challenger with dtr. She will stop diltiazem and atenolol and start carvedilol 6.25 mg BID. Dtr unsure of specifics with reaction to Diovan.  Will ask PCP about this as well and get back to me. Adv Dr. Harrington Challenger in agreement with Dr. Caryl Comes re anticoagulation.  She still plans to speak with her sister, neurology and family physician before making a decision about this.  Plans to call back with update on this as well. Scheduled f/u per PR for 12/25/17.

## 2017-12-05 NOTE — Telephone Encounter (Addendum)
-----   Message from Fay Records, MD sent at 11/29/2017  4:13 PM EDT ----- Electrolytes and kidney function are OK   Thyroid function is normal  No evid for inflammation   Fluid appears mildly increased   Await echo  LDL is elevated at 144   Triglycerides are up   Watch carbs    WIth CT scan she had showing atherosclerosis I would recomm 10 Crestor   Repeat lipids, AST in 8 wks   _____________________________________________________________________  Notes recorded by Fay Records, MD on 12/04/2017 at 11:06 PM EDT There appears to be a very small pericardial effusion Pumping function of the heart is not normal  I cannot see images from prvious echo (reported normal) She needs to have meds adjusted.  I would recomm stopping atenolol and diltiazem  Starte coreg 6.25 bid Need to confirm reaction to ARB  (? Swelling  ? Where )   Alos , she was just seen by Olin Pia Should be on anticoagulation    F/U in 3 wks

## 2017-12-25 ENCOUNTER — Ambulatory Visit: Payer: Medicare HMO | Admitting: Internal Medicine

## 2017-12-25 ENCOUNTER — Encounter: Payer: Self-pay | Admitting: Internal Medicine

## 2017-12-25 VITALS — BP 128/80 | HR 71 | Ht 65.0 in | Wt 141.4 lb

## 2017-12-25 DIAGNOSIS — I480 Paroxysmal atrial fibrillation: Secondary | ICD-10-CM | POA: Diagnosis not present

## 2017-12-25 DIAGNOSIS — I5022 Chronic systolic (congestive) heart failure: Secondary | ICD-10-CM | POA: Diagnosis not present

## 2017-12-25 MED ORDER — CARVEDILOL 6.25 MG PO TABS: 6.2500 mg | ORAL_TABLET | Freq: Two times a day (BID) | ORAL | 11 refills | Status: DC

## 2017-12-25 MED ORDER — APIXABAN 5 MG PO TABS: 5.0000 mg | ORAL_TABLET | Freq: Two times a day (BID) | ORAL | 11 refills | Status: DC

## 2017-12-25 NOTE — Progress Notes (Signed)
Cardiology Office Note   Date:  12/25/2017   ID:  Kristin Gomez, DOB 1945-09-30, MRN [id number]  PCP:  Raelyn Number, MD  Cardiologist:   Dorris Carnes, MD   The pt is new  REferred by Dr Jannette Fogo for evaluation of pericardial effusion   History of Present Illness: Kristin Gomez is a 72 y.o. female with a history of HTN, PPM, CHF, COPD, DM Type II, HL    She is followed by Dr Jannette Fogo   Echo done showed pericardial effusion   Took Prednisone dose pack   I saw her earlier this fall   After visit I recomm a repeat echo to reeval LVEF and also PE   Echo on Dec 02, 2017 showed no PE  LVEF was 35 to 40%     The pt says her breathing is fair   She denies CP    Current Meds  Medication Sig  . albuterol (PROVENTIL HFA;VENTOLIN HFA) 108 (90 Base) MCG/ACT inhaler Inhale 2 puffs into the lungs every 6 (six) hours as needed for wheezing or shortness of breath.   . carvedilol (COREG) 6.25 MG tablet Take 1 tablet (6.25 mg total) by mouth 2 (two) times daily.  . Cholecalciferol (VITAMIN D3 PO) Take 2 capsules by mouth daily.   . Cyanocobalamin (B-12 COMPLIANCE INJECTION) 1000 MCG/ML KIT Inject 1 mL into the muscle every 14 (fourteen) days.   . furosemide (LASIX) 20 MG tablet Take 20 mg by mouth every morning.  Marland Kitchen glipiZIDE (GLUCOTROL XL) 5 MG 24 hr tablet Take 5 mg by mouth at bedtime.  Marland Kitchen ipratropium-albuterol (DUONEB) 0.5-2.5 (3) MG/3ML SOLN Take 3 mLs by nebulization every 6 (six) hours as needed (for shortness of breath or wheezing).   . lansoprazole (PREVACID) 30 MG capsule Take 30 mg by mouth daily as needed (for reflux).   . Ondansetron HCl (ZOFRAN PO) Take 1 tablet by mouth daily as needed (nausea).  . potassium chloride SA (K-DUR,KLOR-CON) 20 MEQ tablet Take 20 mEq by mouth daily.     Allergies:   Aspirin; Azithromycin; Infliximab; Methotrexate; Mushroom extract complex; Nsaids; Avelox [moxifloxacin hcl in nacl]; Diovan [valsartan]; Amoxicillin; Atorvastatin; Contrast media [iodinated  diagnostic agents]; Latex; Metformin; Metformin and related; Penicillins; Percocet [oxycodone-acetaminophen]; Prednisone; Propofol; Sulfa antibiotics; and Vicodin [hydrocodone-acetaminophen]   Past Medical History:  Diagnosis Date  . Arthritis    "all over" (09/26/2017)  . Asthma   . Diabetes mellitus   . Fibromyalgia   . Heart disease   . High cholesterol   . History of blood transfusion    "related to a surgery" (09/26/2017)  . Hypertension   . Memory difficulty 07/26/2017  . Myocardial infarction [facility])    "years ago" (09/26/2017)  . Pacemaker   . 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 Stroke [facility]) 02/2017   "affected my memory" (09/26/2017)  . Type II diabetes mellitus ([place])     Past Surgical History:  Procedure Laterality Date  . CARDIAC CATHETERIZATION    . CATARACT EXTRACTION W/ INTRAOCULAR LENS  IMPLANT, BILATERAL Bilateral   . CHOLECYSTECTOMY    . COLONOSCOPY    . I&D EXTREMITY  03/31/2011   Procedure: IRRIGATION AND DEBRIDEMENT EXTREMITY;  Surgeon: Tennis Must, MD;  Location: [place];  Service: Orthopedics;  Laterality: Left;  left long finger  . INSERT / REPLACE / REMOVE PACEMAKER    . KNEE ARTHROSCOPY Left   . MASS EXCISION  03/08/2011   Procedure: EXCISION MASS;  Surgeon:  Tennis Must, MD;  Location: [place];  Service: Orthopedics;  Laterality: Left;  Excision Mass Left Long Finger and Debridement of Distal Interphalangeal  Joint  . TONSILLECTOMY    . TOTAL ABDOMINAL HYSTERECTOMY    . TUBAL LIGATION       Social History:  The patient  reports that she has never smoked. She has never used smokeless tobacco. She reports that she does not drink alcohol or use drugs.   Family History:  The patient's family history includes Alzheimer's disease in her mother; Diabetes in her brother; Heart attack in her father.    ROS:  Please see the history of present illness. All other systems are reviewed and  Negative to the above  problem except as noted.    PHYSICAL EXAM: VS:  BP 128/80   Pulse 71   Ht 5' 5" (1.651 m)   Wt 141 lb 6.4 oz (64.1 kg)   SpO2 90%   BMI 23.53 kg/m   GEN: Well nourished, well developed, in no acute distress  HEENT: normal  Neck:JVP is normal   Cardiac: RRR; no murmurs, rubs, or gallops  Triv edema   Foot cool   Respiratory:  clear to auscultation bilaterally, normal work of breathing GI: soft, nontender, nondistended, + BS  No hepatomegaly  MS: no deformity Moving all extremities   Skin: warm and dry, no rash   EKG:  EKG is not ordered today.   Lipid Panel    Component Value Date/Time   CHOL 265 (H) 11/28/2017 1148   TRIG 347 (H) 11/28/2017 1148   HDL 52 11/28/2017 1148   CHOLHDL 5.1 (H) 11/28/2017 1148   LDLCALC 144 (H) 11/28/2017 1148      Wt Readings from Last 3 Encounters:  12/25/17 141 lb 6.4 oz (64.1 kg)  11/29/17 143 lb 3.2 oz (65 kg)  11/28/17 146 lb 12.8 oz (66.6 kg)      ASSESSMENT AND PLAN    1  Pericardial effusion   PE has resolved  2  Chronic systolic CHF    Echo LVEF 35 to 40%   VOlume is not too bad   She is o Coreg now   Had swelling with valsartan   (not clear where)   I would keep on current regimne for now   COnider additon of hydralazine/NTG if BP allows   Watch salt  3  Hx PAF   Pt needs to be back on Eliquis   WIll get samples   Described increased incidence of stroke    F/U in 2 months    Current medicines are reviewed at length with the patient today.  The patient does not have concerns regarding medicines.  Signed, Dorris Carnes, MD  12/25/2017 2:45 PM    [facility] [place], [place], [place]  [address] Phone: [phone]; Fax: [phone]

## 2017-12-25 NOTE — Patient Instructions (Addendum)
Medication Instructions:  Your physician has recommended you make the following change in your medication:  1.) start Eliquis 5 mg by mouth twice a day  If you need a refill on your cardiac medications before your next appointment, please call your pharmacy.   Lab work: none If you have labs (blood work) drawn today and your tests are completely normal, you will receive your results only by: Marland Kitchen MyChart Message (if you have MyChart) OR . A paper copy in the mail If you have any lab test that is abnormal or we need to change your treatment, we will call you to review the results.  Testing/Procedures: none  Follow-Up: Your physician recommends that you schedule a follow-up appointment in: 2 months.  See below.  Any Other Special Instructions Will Be Listed Below (If Applicable).

## 2018-01-10 ENCOUNTER — Telehealth: Payer: Self-pay | Admitting: Neurology

## 2018-01-10 MED ORDER — MEMANTINE HCL 5 MG PO TABS: ORAL_TABLET | ORAL | 0 refills | Status: DC

## 2018-01-10 NOTE — Telephone Encounter (Signed)
I called the daughter, the patient cannot tolerate Aricept secondary to diarrhea.  She will stop the medication, we will start Namenda, work up on the dose gradually.  She will call for a maintenance dose prescription if she tolerates the 10 mg twice daily dosing.

## 2018-01-10 NOTE — Addendum Note (Signed)
Addended by: Kathrynn Ducking on: 01/10/2018 10:30 AM   Modules accepted: Orders

## 2018-01-10 NOTE — Telephone Encounter (Signed)
Patient's daughter is calling and wanting to know if there is another RX can be given because donepezil is not working at all for her . Patient still having diaerhea . And memory is worse. Please call .

## 2018-01-10 NOTE — Telephone Encounter (Signed)
Fax confirmation received for memantine [facility] [phone].

## 2018-02-20 ENCOUNTER — Ambulatory Visit: Payer: Medicare HMO | Admitting: Internal Medicine

## 2018-02-20 ENCOUNTER — Encounter: Payer: Self-pay | Admitting: Internal Medicine

## 2018-02-20 VITALS — BP 116/88 | HR 82 | Ht 65.0 in | Wt 138.0 lb

## 2018-02-20 DIAGNOSIS — I5022 Chronic systolic (congestive) heart failure: Secondary | ICD-10-CM

## 2018-02-20 MED ORDER — LISINOPRIL 5 MG PO TABS: 5.0000 mg | ORAL_TABLET | Freq: Every day | ORAL | 3 refills | Status: DC

## 2018-02-20 NOTE — Progress Notes (Signed)
Cardiology Office Note   Date:  02/20/2018   ID:  Kristin Gomez, DOB 1945/06/14, MRN [id number]  PCP:  Raelyn Number, MD  Cardiologist:   Dorris Carnes, MD   Pt presents for f/u of CHF      History of Present Illness: Kristin Gomez is a 73 y.o. female with a history of HTN, PPM, CHF, COPD, DM Type II, HL   Echo done showed pericardial effusion   Took Prednisone dose pack    After visit I recomm a repeat echo to reeval LVEF and also PE   Echo on Dec 02, 2017 showed no PE  LVEF was 35 to 40%    I saw her in clinic on Dec 25, 2017    Since seen the pt returns with her daugher   She is a poor historian Breathing is stable   No clear CP      Current Meds  Medication Sig  . albuterol (PROVENTIL HFA;VENTOLIN HFA) 108 (90 Base) MCG/ACT inhaler Inhale 2 puffs into the lungs every 6 (six) hours as needed for wheezing or shortness of breath.   Marland Kitchen apixaban (ELIQUIS) 5 MG TABS tablet Take 1 tablet (5 mg total) by mouth 2 (two) times daily.  . carvedilol (COREG) 6.25 MG tablet Take 1 tablet (6.25 mg total) by mouth 2 (two) times daily.  . Cholecalciferol (VITAMIN D3 PO) Take 1 capsule by mouth daily.   . Cyanocobalamin (B-12 COMPLIANCE INJECTION) 1000 MCG/ML KIT Inject 1 mL into the muscle every 14 (fourteen) days.   . furosemide (LASIX) 20 MG tablet Take 20 mg by mouth every morning.  Marland Kitchen glipiZIDE (GLUCOTROL XL) 5 MG 24 hr tablet Take 5 mg by mouth at bedtime.  Marland Kitchen ipratropium-albuterol (DUONEB) 0.5-2.5 (3) MG/3ML SOLN Take 3 mLs by nebulization every 6 (six) hours as needed (for shortness of breath or wheezing).   . lansoprazole (PREVACID) 30 MG capsule Take 30 mg by mouth daily as needed (for reflux).   . memantine (NAMENDA) 5 MG tablet Take 1 tablet daily for one week, then take 1 tablet twice daily for one week, then take 1 tablet in the morning and 2 in the evening for one week, then take 2 tablets twice daily  . Ondansetron HCl (ZOFRAN PO) Take 1 tablet by mouth daily as needed (nausea).    . potassium chloride SA (K-DUR,KLOR-CON) 20 MEQ tablet Take 20 mEq by mouth daily.     Allergies:   Aspirin; Azithromycin; Infliximab; Methotrexate; Mushroom extract complex; Nsaids; Aricept [donepezil hcl]; Avelox [moxifloxacin hcl in nacl]; Diovan [valsartan]; Amoxicillin; Atorvastatin; Contrast media [iodinated diagnostic agents]; Latex; Metformin; Metformin and related; Penicillins; Percocet [oxycodone-acetaminophen]; Prednisone; Propofol; Sulfa antibiotics; and Vicodin [hydrocodone-acetaminophen]   Past Medical History:  Diagnosis Date  . Arthritis    "all over" (09/26/2017)  . Asthma   . Diabetes mellitus   . Fibromyalgia   . Heart disease   . High cholesterol   . History of blood transfusion    "related to a surgery" (09/26/2017)  . Hypertension   . Memory difficulty 07/26/2017  . Myocardial infarction [facility])    "years ago" (09/26/2017)  . Pacemaker   . 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 Stroke [facility]) 02/2017   "affected my memory" (09/26/2017)  . Type II diabetes mellitus ([place])     Past Surgical History:  Procedure Laterality Date  . CARDIAC CATHETERIZATION    . CATARACT EXTRACTION W/ INTRAOCULAR LENS  IMPLANT, BILATERAL Bilateral   . CHOLECYSTECTOMY    .  COLONOSCOPY    . I&D EXTREMITY  03/31/2011   Procedure: IRRIGATION AND DEBRIDEMENT EXTREMITY;  Surgeon: Tennis Must, MD;  Location: [place];  Service: Orthopedics;  Laterality: Left;  left long finger  . INSERT / REPLACE / REMOVE PACEMAKER    . KNEE ARTHROSCOPY Left   . MASS EXCISION  03/08/2011   Procedure: EXCISION MASS;  Surgeon: Tennis Must, MD;  Location: [place];  Service: Orthopedics;  Laterality: Left;  Excision Mass Left Long Finger and Debridement of Distal Interphalangeal  Joint  . TONSILLECTOMY    . TOTAL ABDOMINAL HYSTERECTOMY    . TUBAL LIGATION       Social History:  The patient  reports that she has never smoked. She has never used smokeless tobacco.  She reports that she does not drink alcohol or use drugs.   Family History:  The patient's family history includes Alzheimer's disease in her mother; Diabetes in her brother; Heart attack in her father.    ROS:  Please see the history of present illness. All other systems are reviewed and  Negative to the above problem except as noted.    PHYSICAL EXAM: VS:  BP 116/88   Pulse 82   Ht 5' 5"  (1.651 m)   Wt 138 lb (62.6 kg)   SpO2 90%   BMI 22.96 kg/m   GEN: Well nourished, well developed, in no acute distress  HEENT: normal  Neck:JVP is not elevated  Cardiac: RRR; no murmurs, rubs, or gallops  Triv edema   Foot cool   Respiratory:  clear to auscultation bilaterally, normal work of breathing GI: soft, nontender, nondistended, + BS  No hepatomegaly  MS: no deformity Moving all extremities   Skin: warm and dry, no rash   EKG:  EKG is not ordered today.   Lipid Panel    Component Value Date/Time   CHOL 265 (H) 11/28/2017 1148   TRIG 347 (H) 11/28/2017 1148   HDL 52 11/28/2017 1148   CHOLHDL 5.1 (H) 11/28/2017 1148   LDLCALC 144 (H) 11/28/2017 1148      Wt Readings from Last 3 Encounters:  02/20/18 138 lb (62.6 kg)  12/25/17 141 lb 6.4 oz (64.1 kg)  11/29/17 143 lb 3.2 oz (65 kg)      ASSESSMENT AND PLAN    1  Pericardial effusion   PE has resolved  2  Chronic systolic CHF    Echo LVEF 35 to 40%   VOlume is not bad I would recomm adding lisinopril   Watch for any swelling / rash and stop immediately    SHe had reported allergic Rxn to Valsartan but it is unclear   Patient not clear on answers      3  Hx PAF  Continue Eliquis    Would set up for echo to reeval LVEF in April / May  F/U in 2 months    Current medicines are reviewed at length with the patient today.  The patient does not have concerns regarding medicines.  Signed, Dorris Carnes, MD  02/20/2018 10:52 AM    [facility] [place], [place], [place]  [address] Phone: [phone]; Fax: [phone]

## 2018-02-20 NOTE — Patient Instructions (Signed)
Medication Instructions:  1. Start lisinopril 5mg  once a day --ON FIRST DAY -- TAKE 1/2 PILL ONLY.  STOP TAKING IF ANY SIGNS OF FACIAL/MOUTH/THROAT SWELLING If you need a refill on your cardiac medications before your next appointment, please call your pharmacy.   Lab work: PER PRESCRIPTION GIVEN TO YOU TODAY - RESULTS TO BE FAXED TO DR ROSS. If you have labs (blood work) drawn today and your tests are completely normal, you will receive your results only by: Marland Kitchen MyChart Message (if you have MyChart) OR . A paper copy in the mail If you have any lab test that is abnormal or we need to change your treatment, we will call you to review the results.  Testing/Procedures: Your physician has requested that you have an echocardiogram. Echocardiography is a painless test that uses sound waves to create images of your heart. It provides your doctor with information about the size and shape of your heart and how well your heart's chambers and valves are working. This procedure takes approximately one hour. There are no restrictions for this procedure. PLEASE SCHEDULE FOR LATE April/May - on a day Dr. Harrington Challenger is in office so that Dr. Harrington Challenger can see patient afterward  Follow-Up: Schedule follow up with Dr. Harrington Challenger, same day as echo in late April/May  Any Other Special Instructions Will Be Listed Below (If Applicable).

## 2018-02-23 ENCOUNTER — Ambulatory Visit: Payer: Medicare HMO | Admitting: Internal Medicine

## 2018-02-27 NOTE — Progress Notes (Signed)
[facility]  PATIENT: Kristin Gomez DOB: Apr 13, 1945   REASON FOR VISIT: Follow-up for memory loss HISTORY FROM: Patient and daughter    HISTORY OF PRESENT ILLNESS:UPDATE 1/15/2020CM Ms. 47, 73 year old female returns for follow-up with history of memory difficulties for several years at least since 2000 according to the daughter.  The daughter lives with her mom.  She was given a slow taper of Namenda at her last visit and she is now on 10 mg twice daily.  In January 2019 she had a sudden decline in memory CT of the brain at [facility] showed no acute changes but chronic small vessel disease.  Some atrophy of the frontal lobes was noted the patient is unable to have a MRI due to her pacemaker.  She was placed on Eliquis at that time but had never been on Plavix before.  The patient is currently driving a vehicle and was advised not to due to her memory loss the daughter keeps up with the finances and writes checks.  Patient can perform her activities of daily living such as feeding dressing and bathing.  She cooks with supervision.  Appetite is good and she sleeps well at night.  No wandering behavior.  No recent falls.  She returns for reevaluation.  Ms. [name] is a 73 year old right-handed white female with a history of some difficulty with memory that dates back several years, her daughter indicates that she has had some mild forgetfulness since 2000, but the memory has gotten much worse over the last year.  In January 2019, the patient had a sudden decline in her memory, they were concerned about a stroke.  The patient underwent a CT scan of the brain that was done at [facility].  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  Some atrophy of the frontal lobes was noted.  The patient is unable to have MRI secondary to a pacemaker.  The patient has undergone a 2D echocardiogram and a carotid  Doppler study, according the family the studies were unremarkable.  For reasons that are unclear to me, the patient was placed on Eliquis, she is allergic to aspirin, but she was not on Plavix at the time of the event in January.  The family indicates that a heart rhythm abnormality such as atrial fibrillation or atrial flutter was never documented.  The patient currently is not driving a motor vehicle.  She is getting assistance with keeping up with medications and appointments.  Her daughter writes the checks and pays the bills.  The patient sleeps well at night but she also reports a lot of fatigue during the day.  She had a sleep study 5 or 6 years ago that was unremarkable.  The patient has a vitamin B12 deficiency, she has been on B12 injections.  She apparently did have a headache around the time of the onset of the memory change in January 2019, but she does not have regular headaches.  She comes to this office for an evaluation.  The patient reports no numbness or weakness of the face, arms, or legs.  She denies any issues controlling the bowels or the bladder or any changes in balance.  REVIEW OF SYSTEMS: Full 14 system review of systems performed and notable only for those listed, all others are neg:  Constitutional: neg  Cardiovascular: Pacemaker Ear/Nose/Throat: neg  Skin: neg Eyes: neg Respiratory: neg Gastroitestinal: neg  Hematology/Lymphatic: neg  Endocrine: neg Musculoskeletal:neg Allergy/Immunology: neg Neurological: Memory loss Psychiatric: neg Sleep : neg   ALLERGIES: Allergies  Allergen Reactions  . Aspirin Shortness Of Breath and Other (See Comments)    Patient states that this caused an asthma attack Patient states that is causes an asthma attack.  . Azithromycin Anaphylaxis  . Infliximab Hives    remicade  . Methotrexate Hives  . Mushroom Extract Complex Anaphylaxis, Shortness Of Breath and Swelling  . Nsaids Shortness Of Breath and Other (See Comments)     Asthma attack  . Aricept [Donepezil Hcl]     Diarrhea  . Avelox [Moxifloxacin Hcl In Nacl] Swelling    Site of swelling not recalled  . Diovan [Valsartan] Swelling    Site of swelling not recalled  . Amoxicillin Rash  . Atorvastatin Swelling and Other (See Comments)    Exacerbated Psoriatic arthritis   . Contrast Media [Iodinated Diagnostic Agents] Rash  . Latex Rash  . Metformin Rash  . Metformin And Related Rash    ??  . Penicillins Rash    Has patient had a PCN reaction causing immediate rash, facial/tongue/throat swelling, SOB or lightheadedness with hypotension: Yes Has patient had a PCN reaction causing severe rash involving mucus membranes or skin necrosis: No Has patient had a PCN reaction that required hospitalization: No Has patient had a PCN reaction occurring within the last 10 years: No If all of the above answers are "NO", then may proceed with Cephalosporin use.   Marland Kitchen Percocet [Oxycodone-Acetaminophen] Nausea Only  . Prednisone Rash    Can only be tolerated with Benadryl  . Propofol Rash  . Sulfa Antibiotics Nausea Only and Rash  . Vicodin [Hydrocodone-Acetaminophen] Nausea Only    HOME MEDICATIONS: Outpatient Medications Prior to Visit  Medication Sig Dispense Refill  . albuterol (PROVENTIL HFA;VENTOLIN HFA) 108 (90 Base) MCG/ACT inhaler Inhale 2 puffs into the lungs every 6 (six) hours as needed for wheezing or shortness of breath.     Marland Kitchen apixaban (ELIQUIS) 5 MG TABS tablet Take 1 tablet (5 mg total) by mouth 2 (two) times daily. 60 tablet 11  . carvedilol (COREG) 6.25 MG tablet Take 1 tablet (6.25 mg total) by mouth 2 (two) times daily. 60 tablet 11  . Cholecalciferol (VITAMIN D3 PO) Take 1 capsule by mouth daily.     . Cyanocobalamin (B-12 COMPLIANCE INJECTION) 1000 MCG/ML KIT Inject 1 mL into the muscle every 14 (fourteen) days.     . furosemide (LASIX) 20 MG tablet Take 20 mg by mouth every morning.    Marland Kitchen glipiZIDE (GLUCOTROL XL) 5 MG 24 hr tablet Take 5 mg by  mouth at bedtime.    Marland Kitchen ipratropium-albuterol (DUONEB) 0.5-2.5 (3) MG/3ML SOLN Take 3 mLs by nebulization every 6 (six) hours as needed (for shortness of breath or wheezing).   2  . lansoprazole (PREVACID) 30 MG capsule Take 30 mg by mouth daily as needed (for reflux).     Marland Kitchen lisinopril (PRINIVIL,ZESTRIL) 5 MG tablet Take 1 tablet (5 mg total) by mouth daily. 90 tablet 3  . memantine (NAMENDA) 5 MG tablet Take 1 tablet daily for one week, then take 1 tablet twice daily for one week, then take 1 tablet in the morning and 2 in the evening for one week, then take 2 tablets twice daily 70 tablet 0  . Ondansetron HCl (ZOFRAN PO) Take 1 tablet by mouth daily as needed (nausea).    . potassium chloride SA (K-DUR,KLOR-CON) 20 MEQ tablet Take 20 mEq by  mouth daily.     No facility-administered medications prior to visit.     PAST MEDICAL HISTORY: Past Medical History:  Diagnosis Date  . Arthritis    "all over" (09/26/2017)  . Asthma   . Diabetes mellitus   . Fibromyalgia   . Heart disease   . High cholesterol   . History of blood transfusion    "related to a surgery" (09/26/2017)  . Hypertension   . Memory difficulty 07/26/2017  . Myocardial infarction [facility])    "years ago" (09/26/2017)  . Pacemaker   . 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 Stroke [facility]) 02/2017   "affected my memory" (09/26/2017)  . Type II diabetes mellitus ([place])     PAST SURGICAL HISTORY: Past Surgical History:  Procedure Laterality Date  . CARDIAC CATHETERIZATION    . CATARACT EXTRACTION W/ INTRAOCULAR LENS  IMPLANT, BILATERAL Bilateral   . CHOLECYSTECTOMY    . COLONOSCOPY    . I&D EXTREMITY  03/31/2011   Procedure: IRRIGATION AND DEBRIDEMENT EXTREMITY;  Surgeon: Tennis Must, MD;  Location: [place];  Service: Orthopedics;  Laterality: Left;  left long finger  . INSERT / REPLACE / REMOVE PACEMAKER    . KNEE ARTHROSCOPY Left   . MASS EXCISION  03/08/2011   Procedure: EXCISION MASS;  Surgeon:  Tennis Must, MD;  Location: [place];  Service: Orthopedics;  Laterality: Left;  Excision Mass Left Long Finger and Debridement of Distal Interphalangeal  Joint  . TONSILLECTOMY    . TOTAL ABDOMINAL HYSTERECTOMY    . TUBAL LIGATION      FAMILY HISTORY: Family History  Problem Relation Age of Onset  . Alzheimer's disease Mother   . Heart attack Father   . Diabetes Brother     SOCIAL HISTORY: Social History   Socioeconomic History  . Marital status: Widowed    Spouse name: Not on file  . Number of children: 2  . Years of education: College  . Highest education level: Not on file  Occupational History  . Not on file  Social Needs  . Financial resource strain: Not on file  . Food insecurity:    Worry: Not on file    Inability: Not on file  . Transportation needs:    Medical: Not on file    Non-medical: Not on file  Tobacco Use  . Smoking status: Never Smoker  . Smokeless tobacco: Never Used  Substance and Sexual Activity  . Alcohol use: Never    Frequency: Never  . Drug use: Never  . Sexual activity: Not Currently  Lifestyle  . Physical activity:    Days per week: Not on file    Minutes per session: Not on file  . Stress: Not on file  Relationships  . Social connections:    Talks on phone: Not on file    Gets together: Not on file    Attends religious service: Not on file    Active member of club or organization: Not on file    Attends meetings of clubs or organizations: Not on file    Relationship status: Not on file  . Intimate partner violence:    Fear of current or ex partner: Not on file    Emotionally abused: Not on file    Physically abused: Not on file    Forced sexual activity: Not on file  Other Topics Concern  . Not on file  Social History Narrative   Lives with daughter  Caffeine use: diet drinks   diet coke out to eat   Rig thhanded      PHYSICAL EXAM  Vitals:   02/28/18 1440  BP: (!) 98/55  Pulse: 79  Weight: 139  lb (63 kg)  Height: _0  (1.651 m)   Body mass index is 23.13 kg/m.  Generalized: Well developed, in no acute distress  Head: normocephalic and atraumatic,. Oropharynx benign  Neck: Supple, no carotid bruits  Cardiac: Regular rate rhythm, no murmur  Musculoskeletal: No deformity   Neurological examination   Mentation: Alert  MMSE - Mini Mental State Exam 02/28/2018 07/26/2017  Not completed: (No Data) -  Orientation to time 3 5  Orientation to Place 1 4  Registration 3 3  Attention/ Calculation 5 2  Recall 1 0  Language- name 2 objects 2 2  Language- repeat 1 1  Language- follow 3 step command 3 2  Language- follow 3 step command-comments She grabbed the paper with both hands -  Language- read & follow direction 1 1  Write a sentence 1 1  Copy design 1 0  Total score 22 21    Follows all commands speech and language fluent.   Cranial nerve II-XII: .Pupils were equal round reactive to light extraocular movements were full, visual field were full on confrontational test. Facial sensation and strength were normal. hearing was intact to finger rubbing bilaterally. Uvula tongue midline. head turning and shoulder shrug were normal and symmetric.Tongue protrusion into cheek strength was normal. Motor: normal bulk and tone, full strength in the BUE, BLE,  Sensory: normal and symmetric to light touch, pinprick, and  Vibration, on the face arms and legs Coordination: finger-nose-finger, heel-to-shin bilaterally, no dysmetria Reflexes: Symmetric and normal bilaterally plantar responses were flexor bilaterally. Gait and Station: Rising up from seated position without assistance, normal stance,  moderate stride, good arm swing, smooth turning, able to perform tiptoe, and heel walking without difficulty. Tandem gait is steady.  No assistive device  DIAGNOSTIC DATA (LABS, IMAGING, TESTING) - I reviewed patient records, labs, notes, testing and imaging myself where available.  Lab Results    Component Value Date   WBC 9.6 09/26/2017   HGB 13.9 09/26/2017   HCT 41.3 09/26/2017   MCV 97.6 09/26/2017   PLT 147 (L) 09/26/2017      Component Value Date/Time   NA 143 11/28/2017 1148   K 4.5 11/28/2017 1148   CL 103 11/28/2017 1148   CO2 21 11/28/2017 1148   GLUCOSE 118 (H) 11/28/2017 1148   GLUCOSE 127 (H) 09/26/2017 0621   BUN 19 11/28/2017 1148   CREATININE 1.12 (H) 11/28/2017 1148   CALCIUM 8.6 (L) 11/28/2017 1148   PROT 5.6 (L) 11/28/2017 1148   ALBUMIN 3.6 11/28/2017 1148   AST 12 11/28/2017 1148   ALT 14 11/28/2017 1148   ALKPHOS 71 11/28/2017 1148   BILITOT 0.4 11/28/2017 1148   GFRNONAA 49 (L) 11/28/2017 1148   GFRAA 57 (L) 11/28/2017 1148   Lab Results  Component Value Date   CHOL 265 (H) 11/28/2017   HDL 52 11/28/2017   LDLCALC 144 (H) 11/28/2017   TRIG 347 (H) 11/28/2017   CHOLHDL 5.1 (H) 11/28/2017    Lab Results  Component Value Date   TSH 4.410 11/28/2017      ASSESSMENT AND PLAN  73 y.o. 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 here to follow-up for progressive memory disturbance and small vessel disease by CT of the brain.   PLANContinue Namenda 85m twice daily will refill Memory stimulating exercises No safety issues pt should  not drive Follow up in 6 months NDennie Bible [facility] APRN  [facility] [address] [place]  [phone]

## 2018-02-28 ENCOUNTER — Ambulatory Visit: Payer: Medicare HMO | Admitting: Nurse Practitioner

## 2018-02-28 ENCOUNTER — Encounter: Payer: Self-pay | Admitting: Nurse Practitioner

## 2018-02-28 VITALS — BP 98/55 | HR 79 | Ht 65.0 in | Wt 139.0 lb

## 2018-02-28 DIAGNOSIS — R413 Other amnesia: Secondary | ICD-10-CM | POA: Diagnosis not present

## 2018-02-28 MED ORDER — MEMANTINE HCL 10 MG PO TABS: 10.0000 mg | ORAL_TABLET | Freq: Two times a day (BID) | ORAL | 6 refills | Status: DC

## 2018-02-28 NOTE — Progress Notes (Signed)
I have read the note, and I agree with the clinical assessment and plan.  Chrystie Hagwood K Jadi Deyarmin   

## 2018-02-28 NOTE — Patient Instructions (Signed)
Continue Namenda 10mg  twice daily will refill Memory stimulating exercises No safety issues pt does not drive Follow up in 6 months

## 2018-03-01 ENCOUNTER — Ambulatory Visit (INDEPENDENT_AMBULATORY_CARE_PROVIDER_SITE_OTHER): Payer: Medicare HMO

## 2018-03-01 DIAGNOSIS — I480 Paroxysmal atrial fibrillation: Secondary | ICD-10-CM

## 2018-03-02 NOTE — Progress Notes (Signed)
Remote pacemaker transmission.   

## 2018-03-08 ENCOUNTER — Telehealth: Payer: Self-pay

## 2018-03-08 NOTE — Telephone Encounter (Signed)
Left message for patient to remind of missed remote transmission.  

## 2018-05-29 ENCOUNTER — Telehealth: Payer: Self-pay | Admitting: *Deleted

## 2018-05-29 NOTE — Telephone Encounter (Signed)
Confirmed with patient's daughter that patient is following up with [facility] (Dr. Caryl Comes) from now on for [facility] management. Pt's Merlin monitoring was released by [facility] on 05/25/18. Enrolled in our clinic today, next automatic transmission scheduled for 06/12/18.   Pt's daughter reports she doesn't want pt to come into the office on 4/27 for echo and appointment with Dr. Harrington Challenger due to Covid-19. Advised that these appointments would likely be rescheduled, but will send this message to [place], RN. Pt's daughter is agreeable to plan.

## 2018-05-30 ENCOUNTER — Ambulatory Visit (INDEPENDENT_AMBULATORY_CARE_PROVIDER_SITE_OTHER): Payer: Medicare HMO | Admitting: *Deleted

## 2018-05-30 ENCOUNTER — Other Ambulatory Visit: Payer: Self-pay

## 2018-05-30 DIAGNOSIS — I5022 Chronic systolic (congestive) heart failure: Secondary | ICD-10-CM

## 2018-05-30 DIAGNOSIS — I480 Paroxysmal atrial fibrillation: Secondary | ICD-10-CM

## 2018-05-31 LAB — CUP PACEART REMOTE DEVICE CHECK
Battery Remaining Longevity: 20 mo
Battery Remaining Percentage: 38 %
Battery Voltage: 2.84 V
Brady Statistic AP VP Percent: 21 %
Brady Statistic AP VS Percent: 1 %
Brady Statistic AS VP Percent: 70 %
Brady Statistic AS VS Percent: 7 %
Brady Statistic RA Percent Paced: 20 %
Date Time Interrogation Session: 20200415060013
Implantable Lead Implant Date: 20081110
Implantable Lead Implant Date: 20081110
Implantable Lead Implant Date: 20081110
Implantable Lead Location: 753858
Implantable Lead Location: 753859
Implantable Lead Location: 753860
Implantable Pulse Generator Implant Date: 20170207
Lead Channel Impedance Value: 430 Ohm
Lead Channel Impedance Value: 530 Ohm
Lead Channel Impedance Value: 830 Ohm
Lead Channel Pacing Threshold Amplitude: 0.625 V
Lead Channel Pacing Threshold Amplitude: 1.125 V
Lead Channel Pacing Threshold Amplitude: 2.75 V
Lead Channel Pacing Threshold Pulse Width: 0.4 ms
Lead Channel Pacing Threshold Pulse Width: 0.4 ms
Lead Channel Pacing Threshold Pulse Width: 1.5 ms
Lead Channel Sensing Intrinsic Amplitude: 11.5 mV
Lead Channel Sensing Intrinsic Amplitude: 3.5 mV
Lead Channel Setting Pacing Amplitude: 1.625
Lead Channel Setting Pacing Amplitude: 2.125
Lead Channel Setting Pacing Amplitude: 3.5 V
Lead Channel Setting Pacing Pulse Width: 0.4 ms
Lead Channel Setting Pacing Pulse Width: 1.5 ms
Lead Channel Setting Sensing Sensitivity: 2 mV
Pulse Gen Model: 3222
Pulse Gen Serial Number: 7699072

## 2018-05-31 NOTE — Telephone Encounter (Signed)
Echo is 4/27 as well as ov. I can arrange virtual ov.  Due to outpatient testing restrictions, we can plan to reschedule echo for later date if ok with Dr. Harrington Challenger.    To Dr. Harrington Challenger for advisement on when to reschedule echo.

## 2018-06-01 NOTE — Telephone Encounter (Signed)
I will determine timing of echo after televisit

## 2018-06-06 ENCOUNTER — Telehealth: Payer: Self-pay | Admitting: Cardiovascular Disease

## 2018-06-06 NOTE — Telephone Encounter (Signed)
Agree   Echo can be done in late summer

## 2018-06-06 NOTE — Telephone Encounter (Signed)
   Primary Cardiologist:  Dorris Carnes, MD   Patient contacted.  History reviewed. I have called and spoken with the patient's daughter.  The patient apparently has some dementia and is not able to remember things very well.  The patient is doing very well and has not had any deterioration in her symptoms.  Given the recent COVID-19 pandemic concerns, I have recommended that we postpone this echocardiogram for at least 3 months.  Clinically she seems to be doing well.  Priority level 3      No symptoms to suggest any unstable cardiac conditions.  Based on discussion, with current pandemic situation, we will be postponing this appointment for Kristin Gomez with a plan for f/u in  12  wks or sooner if feasible/necessary.  If symptoms change, she has been instructed to contact our office.   Routing to C19 CANCEL pool for tracking (P CV DIV CV19 CANCEL - reason for visit "other.") and assigning priority (3 = >12 wks).   [name], MD  06/06/2018 10:38 AM         .

## 2018-06-06 NOTE — Progress Notes (Signed)
Remote pacemaker transmission.   

## 2018-06-07 ENCOUNTER — Telehealth: Payer: Self-pay

## 2018-06-07 NOTE — Telephone Encounter (Signed)
Spoke with pts daughter, Kristin Gomez who is pts care taker as pt has become "more forgetful"   Kristin Gomez is available to help pt with a VIDEO visit on 4/27 at [facility]  "(Name), I am calling you today to discuss your upcoming appointment. We are currently trying to limit exposure to the virus that causes COVID-19 by seeing patients at home rather than in the office."  1. "What is the BEST phone number to call the day of the visit?" - include this in appointment notes  2. Do you have or have access to (through a family member/friend) a smartphone with video capability that we can use for your visit?" a. If yes - list this number in appt notes as cell (if different from BEST phone #) and list the appointment type as a VIDEO visit in appointment notes b. If no - list the appointment type as a PHONE visit in appointment notes  3. Confirm consent - "In the setting of the current Covid19 crisis, you are scheduled for a VIDEO visit with your provider on 4/27 at 8am.  Just as we do with many in-office visits, in order for you to participate in this visit, we must obtain consent.  If you'd like, I can send this to your mychart (if signed up) or email for you to review.  Otherwise, I can obtain your verbal consent now.  All virtual visits are billed to your insurance company just like a normal visit would be.  By agreeing to a virtual visit, we'd like you to understand that the technology does not allow for your provider to perform an examination, and thus may limit your provider's ability to fully assess your condition. If your provider identifies any concerns that need to be evaluated in person, we will make arrangements to do so.  Finally, though the technology is pretty good, we cannot assure that it will always work on either your or our end, and in the setting of a video visit, we may have to convert it to a phone-only visit.  In either situation, we cannot ensure  that we have a secure connection.  Are you willing to proceed?" STAFF: Did the patient verbally acknowledge consent to telehealth visit? Document YES/NO here: YES  4. Advise patient to be prepared - "Two hours prior to your appointment, go ahead and check your blood pressure, pulse, oxygen saturation, and your weight (if you have the equipment to check those) and write them all down. When your visit starts, your provider will ask you for this information. If you have an Apple Watch or Kardia device, please plan to have heart rate information ready on the day of your appointment. Please have a pen and paper handy nearby the day of the visit as well."  5. Give patient instructions for MyChart download to smartphone OR [email or web address] as below if video visit (depending on what platform provider is using)  6. Inform patient they will receive a phone call 15 minutes prior to their appointment time (may be from unknown caller ID) so they should be prepared to answer    TELEPHONE CALL NOTE  Kristin Gomez has been deemed a candidate for a follow-up tele-health visit to limit community exposure during the Covid-19 pandemic. I spoke with the patient via phone to ensure availability of phone/video source, confirm preferred email & phone number, and discuss instructions and expectations.  I reminded Kristin Gomez to be prepared with any  vital sign and/or heart rhythm information that could potentially be obtained via home monitoring, at the time of her visit. I reminded Kristin Gomez to expect a phone call prior to her visit.  Wilma Flavin, RN 06/07/2018 12:34 PM

## 2018-06-11 ENCOUNTER — Telehealth (INDEPENDENT_AMBULATORY_CARE_PROVIDER_SITE_OTHER): Payer: Medicare HMO | Admitting: Internal Medicine

## 2018-06-11 ENCOUNTER — Other Ambulatory Visit (HOSPITAL_COMMUNITY): Payer: Medicare HMO

## 2018-06-11 ENCOUNTER — Encounter: Payer: Self-pay | Admitting: Internal Medicine

## 2018-06-11 ENCOUNTER — Other Ambulatory Visit: Payer: Self-pay

## 2018-06-11 VITALS — BP 65/58 | HR 76 | Ht 65.0 in | Wt 135.0 lb

## 2018-06-11 DIAGNOSIS — I480 Paroxysmal atrial fibrillation: Secondary | ICD-10-CM

## 2018-06-11 DIAGNOSIS — I5022 Chronic systolic (congestive) heart failure: Secondary | ICD-10-CM | POA: Diagnosis not present

## 2018-06-11 DIAGNOSIS — I10 Essential (primary) hypertension: Secondary | ICD-10-CM

## 2018-06-11 NOTE — Patient Instructions (Signed)
Medication Instructions:  No changes If you need a refill on your cardiac medications before your next appointment, please call your pharmacy.   Lab work: None.  We requested your recent blood work results from your primary care physician.  Testing/Procedures: Echocardiogram - scheduled for 10/29/18 at 2:00 pm  Follow-Up: Scheduled follow up with Dr. Harrington Challenger on 10/29/18 at 3:00 pm  Any Other Special Instructions Will Be Listed Below (If Applicable). Called and informed patient's daughter, [name], of upcoming appointments. Requested labs from Dr. Tenna Delaine office (internal medicine)

## 2018-06-11 NOTE — Progress Notes (Signed)
Virtual Visit via Video Note   This visit type was conducted due to national recommendations for restrictions regarding the COVID-19 Pandemic (e.g. social distancing) in an effort to limit this patient's exposure and mitigate transmission in our community.  Due to her co-morbid illnesses, this patient is at least at moderate risk for complications without adequate follow up.  This format is felt to be most appropriate for this patient at this time.  All issues noted in this document were discussed and addressed.  A limited physical exam was performed with this format.  Please refer to the patient's chart for her consent to telehealth for [facility].   Evaluation Performed:  Follow-up visit  Date:  06/11/2018   ID:  Kristin Gomez, DOB [birth year], MRN [id number]  Patient Location: Home Provider Location: Home  PCP:  Raelyn Number, MD  Cardiologist:  Dorris Carnes, MD  Electrophysiologist:  None   Chief Complaint:    F/U of HTN and CHF    History of Present Illness:    Kristin Gomez is a 73 y.o. 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   Last echo in October 2019 LVEF 35 to 40% with no pericardial effusoin.    I saw her in [place]  She came in with her daughter    The pt is at home    She does not go out    Breathing is OK  She denies edema in legs   She denies CP    No dizziness  The patient does not have symptoms concerning for COVID-19 infection (fever, chills, cough, or new shortness of breath).    Past Medical History:  Diagnosis Date  . Arthritis    "all over" (09/26/2017)  . Asthma   . Diabetes mellitus   . Fibromyalgia   . Heart disease   . High cholesterol   . History of blood transfusion    "related to a surgery" (09/26/2017)  . Hypertension   . Memory difficulty 07/26/2017  . Myocardial infarction [facility])    "years ago" (09/26/2017)  . Pacemaker   . 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  Stroke [facility]) 02/2017   "affected my memory" (09/26/2017)  . Type II diabetes mellitus ([place])    Past Surgical History:  Procedure Laterality Date  . CARDIAC CATHETERIZATION    . CATARACT EXTRACTION W/ INTRAOCULAR LENS  IMPLANT, BILATERAL Bilateral   . CHOLECYSTECTOMY    . COLONOSCOPY    . I&D EXTREMITY  03/31/2011   Procedure: IRRIGATION AND DEBRIDEMENT EXTREMITY;  Surgeon: Tennis Must, MD;  Location: [place];  Service: Orthopedics;  Laterality: Left;  left long finger  . INSERT / REPLACE / REMOVE PACEMAKER    . KNEE ARTHROSCOPY Left   . MASS EXCISION  03/08/2011   Procedure: EXCISION MASS;  Surgeon: Tennis Must, MD;  Location: [place];  Service: Orthopedics;  Laterality: Left;  Excision Mass Left Long Finger and Debridement of Distal Interphalangeal  Joint  . TONSILLECTOMY    . TOTAL ABDOMINAL HYSTERECTOMY    . TUBAL LIGATION       Current Meds  Medication Sig  . albuterol (PROVENTIL HFA;VENTOLIN HFA) 108 (90 Base) MCG/ACT inhaler Inhale 2 puffs into the lungs every 6 (six) hours as needed for wheezing or shortness of breath.   Marland Kitchen amLODipine (NORVASC) 10 MG tablet Take 10 mg by mouth 2 (two)  times a day.  Marland Kitchen apixaban (ELIQUIS) 5 MG TABS tablet Take 1 tablet (5 mg total) by mouth 2 (two) times daily.  . carvedilol (COREG) 6.25 MG tablet Take 1 tablet (6.25 mg total) by mouth 2 (two) times daily.  . Cholecalciferol (VITAMIN D3 PO) Take 1 capsule by mouth daily.   . Cyanocobalamin (B-12 COMPLIANCE INJECTION) 1000 MCG/ML KIT Inject 1 mL into the muscle every 14 (fourteen) days.   . furosemide (LASIX) 20 MG tablet Take 20 mg by mouth every morning.  Marland Kitchen glipiZIDE (GLUCOTROL XL) 5 MG 24 hr tablet Take 5 mg by mouth at bedtime.  Marland Kitchen ipratropium-albuterol (DUONEB) 0.5-2.5 (3) MG/3ML SOLN Take 3 mLs by nebulization every 6 (six) hours as needed (for shortness of breath or wheezing).   Marland Kitchen lisinopril (PRINIVIL,ZESTRIL) 5 MG tablet Take 1 tablet (5 mg total) by mouth  daily.  . memantine (NAMENDA) 10 MG tablet Take 1 tablet (10 mg total) by mouth 2 (two) times daily.  . Ondansetron HCl (ZOFRAN PO) Take 1 tablet by mouth daily as needed (nausea).  . potassium chloride SA (K-DUR,KLOR-CON) 20 MEQ tablet Take 20 mEq by mouth daily.     Allergies:   Aspirin; Azithromycin; Infliximab; Methotrexate; Mushroom extract complex; Nsaids; Aricept [donepezil hcl]; Avelox [moxifloxacin hcl in nacl]; Diovan [valsartan]; Amoxicillin; Atorvastatin; Contrast media [iodinated diagnostic agents]; Latex; Metformin; Metformin and related; Penicillins; Percocet [oxycodone-acetaminophen]; Prednisone; Propofol; Sulfa antibiotics; and Vicodin [hydrocodone-acetaminophen]   Social History   Tobacco Use  . Smoking status: Never Smoker  . Smokeless tobacco: Never Used  Substance Use Topics  . Alcohol use: Never    Frequency: Never  . Drug use: Never     Family Hx: The patient's family history includes Alzheimer's disease in her mother; Diabetes in her brother; Heart attack in her father.  ROS:   Please see the history of present illness.    All other systems reviewed and are negative.   Prior CV studies:   The following studies were reviewed today: Echo:  Oct 2019   LVEF 35 to 40%  Labs/Other Tests and Data Reviewed:    EKG:  Not done as tele visit   Recent Labs: 09/25/2017: Magnesium 2.1 09/26/2017: Hemoglobin 13.9; Platelets 147 11/28/2017: ALT 14; BUN 19; Creatinine, Ser 1.12; NT-Pro BNP 452; Potassium 4.5; Sodium 143; TSH 4.410   Recent Lipid Panel Lab Results  Component Value Date/Time   CHOL 265 (H) 11/28/2017 11:48 AM   TRIG 347 (H) 11/28/2017 11:48 AM   HDL 52 11/28/2017 11:48 AM   CHOLHDL 5.1 (H) 11/28/2017 11:48 AM   LDLCALC 144 (H) 11/28/2017 11:48 AM    Wt Readings from Last 3 Encounters:  06/11/18 135 lb (61.2 kg)  02/28/18 139 lb (63 kg)  02/20/18 138 lb (62.6 kg)     Objective:    Vital Signs:  BP (!) 65/58 Comment: patient was laying down  73/52  Pulse 76   Ht _0  (1.651 m)   Wt 135 lb (61.2 kg)   BMI 22.47 kg/m    Not done as tele visit  ASSESSMENT & PLAN:    1. Chronic systolic CHF   By report, patients volume sounds OK    Plan was to get echo this spring to reevaluate LVEF   Will postpon to later ths summer   Watch salt Will get lgabs from Dr Jannette Fogo  2  Hx PAF   Keep on eliqus      3   Hx HTN  BP is very  low today   Wrist cuff   Not sure accurate as she feels fine  No dizzienss   I have asked them to get another cough, a arm cuff   Keep following    4.  COVID-19 Education: The signs and symptoms of COVID-19 were discussed with the patient and how to seek care for testing (follow up with PCP or arrange E-visit). The importance of social distancing was discussed today.  Time:   Today, I have spent 25 minutes with the patient with telehealth technology discussing the above problems.     Medication Adjustments/Labs and Tests Ordered: Current medicines are reviewed at length with the patient today.  Concerns regarding medicines are outlined above.   Tests Ordered: No orders of the defined types were placed in this encounter.   Medication Changes: No orders of the defined types were placed in this encounter.   Disposition:  Follow up in September 2020 with echo   Signed, Dorris Carnes, MD  06/11/2018 8:01 AM    [place]

## 2018-06-14 ENCOUNTER — Telehealth: Payer: Self-pay | Admitting: Internal Medicine

## 2018-06-14 NOTE — Telephone Encounter (Signed)
° °  Patient's daughter states she is returning a call from 4/29, regarding bloodwork

## 2018-06-14 NOTE — Telephone Encounter (Signed)
Notes recorded by Fay Records, MD on 06/12/2018 at 10:21 PM EDT LDL chol is 135  Triglycerides are 325  With hx of DM and also the fact that she cholesterol plaquing on aorta I would recomm statin toget LDL down Would recom Crestor 10 mg  Check lipds in 10 wks MEtabolized very different from lipitor which she tried in past Watch carbs, glucose ____________________________________________________________  Pt's daughter returned call.  I explained information from above from Dr. Harrington Challenger re: labs, risk factors, target LDL, diet. She would like to talk to pt's PCP, Dr. Jannette Fogo before starting Crestor.  Aware to call back after discussing to let us know.

## 2018-07-04 DIAGNOSIS — I361 Nonrheumatic tricuspid (valve) insufficiency: Secondary | ICD-10-CM | POA: Diagnosis not present

## 2018-07-04 DIAGNOSIS — I951 Orthostatic hypotension: Secondary | ICD-10-CM

## 2018-07-04 DIAGNOSIS — E119 Type 2 diabetes mellitus without complications: Secondary | ICD-10-CM

## 2018-07-04 DIAGNOSIS — I10 Essential (primary) hypertension: Secondary | ICD-10-CM

## 2018-07-04 DIAGNOSIS — R55 Syncope and collapse: Secondary | ICD-10-CM | POA: Diagnosis not present

## 2018-07-04 DIAGNOSIS — M797 Fibromyalgia: Secondary | ICD-10-CM | POA: Diagnosis not present

## 2018-07-05 DIAGNOSIS — M797 Fibromyalgia: Secondary | ICD-10-CM | POA: Diagnosis not present

## 2018-07-05 DIAGNOSIS — R55 Syncope and collapse: Secondary | ICD-10-CM | POA: Diagnosis not present

## 2018-07-05 DIAGNOSIS — E119 Type 2 diabetes mellitus without complications: Secondary | ICD-10-CM | POA: Diagnosis not present

## 2018-07-05 DIAGNOSIS — I951 Orthostatic hypotension: Secondary | ICD-10-CM | POA: Diagnosis not present

## 2018-07-06 DIAGNOSIS — M797 Fibromyalgia: Secondary | ICD-10-CM | POA: Diagnosis not present

## 2018-07-06 DIAGNOSIS — I951 Orthostatic hypotension: Secondary | ICD-10-CM | POA: Diagnosis not present

## 2018-07-06 DIAGNOSIS — R55 Syncope and collapse: Secondary | ICD-10-CM | POA: Diagnosis not present

## 2018-07-06 DIAGNOSIS — E119 Type 2 diabetes mellitus without complications: Secondary | ICD-10-CM | POA: Diagnosis not present

## 2018-07-17 ENCOUNTER — Telehealth: Payer: Self-pay | Admitting: *Deleted

## 2018-07-17 NOTE — Telephone Encounter (Signed)
NOTES ON FILE AND SCANNED IN PT'S CHART FROM DR. Omak [facility] [id number].

## 2018-08-03 ENCOUNTER — Encounter: Payer: Self-pay | Admitting: Internal Medicine

## 2018-08-03 ENCOUNTER — Telehealth (INDEPENDENT_AMBULATORY_CARE_PROVIDER_SITE_OTHER): Payer: Medicare HMO | Admitting: Internal Medicine

## 2018-08-03 ENCOUNTER — Other Ambulatory Visit: Payer: Self-pay

## 2018-08-03 VITALS — BP 162/88 | HR 80 | Ht 65.0 in | Wt 134.0 lb

## 2018-08-03 DIAGNOSIS — I509 Heart failure, unspecified: Secondary | ICD-10-CM

## 2018-08-03 DIAGNOSIS — Z950 Presence of cardiac pacemaker: Secondary | ICD-10-CM

## 2018-08-03 DIAGNOSIS — I5022 Chronic systolic (congestive) heart failure: Secondary | ICD-10-CM

## 2018-08-03 DIAGNOSIS — I480 Paroxysmal atrial fibrillation: Secondary | ICD-10-CM

## 2018-08-03 MED ORDER — CARVEDILOL 6.25 MG PO TABS: 6.2500 mg | ORAL_TABLET | Freq: Two times a day (BID) | ORAL | 3 refills | Status: DC

## 2018-08-03 NOTE — Patient Instructions (Addendum)
Spoke with pt's daughter. She had questions regarding restarting pt's lisinopril. I explained her Coreg was increased to 6.25mg  bid. I advised her to monitor pt's BP. If it remained elevated she may call the office back and ask Dr Harrington Challenger or Dr Caryl Comes for advisement.   She understands her follow up with be in 12 months.

## 2018-08-03 NOTE — Progress Notes (Signed)
Electrophysiology TeleHealth Note   Due to national recommendations of social distancing due to COVID 19, an audio/video telehealth visit is felt to be most appropriate for this patient at this time.  See MyChart message from today for the patient's consent to telehealth for [facility].   Date:  08/03/2018   ID:  Kristin Gomez, DOB 11-21-1945, MRN [id number]  Location: patient's home  Provider location: [address] [place]  Evaluation Performed: Follow-up visit  PCP:  Raelyn Number, MD  Cardiologist:   PR Electrophysiologist:  SK   Chief Complaint:  Pacemaker and interval stroke  History of Present Illness:    Kristin Gomez is a 73 y.o. female who presents via audio/video conferencing for a telehealth visit today.  Since last being seen in our clinic, the patient reports 5/20 >> CVA [facility]) >> R hemiparesis and loss of vision; difficulty swallowing/food and saliva.  Improved  No chest pain or sob   Walking in the yard  No edema or palps  DATE TEST EF   5/19 Echo   60-65 %   5/20 Echo  45-50%    Date Cr K Hgb  10/19 1.12 4.5 13.9 (8/19)  5/20 ([facility]) 0.8 3.7 11.3     The patient denies symptoms of fevers, chills, cough, or new SOB worrisome for COVID 19.    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[place])    Stroke ([place]) 02/2017   "affected my memory" (09/26/2017)   Type II diabetes mellitus ([place])     Past Surgical History:  Procedure Laterality Date   CARDIAC CATHETERIZATION     CATARACT EXTRACTION W/ INTRAOCULAR LENS  IMPLANT, BILATERAL Bilateral     CHOLECYSTECTOMY     COLONOSCOPY     I&D EXTREMITY  03/31/2011   Procedure: IRRIGATION AND DEBRIDEMENT EXTREMITY;  Surgeon: Tennis Must, MD;  Location: [place];  Service: Orthopedics;  Laterality: Left;  left long finger   INSERT / REPLACE / REMOVE PACEMAKER     KNEE ARTHROSCOPY Left    MASS EXCISION  03/08/2011   Procedure: EXCISION MASS;  Surgeon: Tennis Must, MD;  Location: [place];  Service: Orthopedics;  Laterality: Left;  Excision Mass Left Long Finger and Debridement of Distal Interphalangeal  Joint   TONSILLECTOMY     TOTAL ABDOMINAL HYSTERECTOMY     TUBAL LIGATION      Current Outpatient Medications  Medication Sig Dispense Refill   albuterol (PROVENTIL HFA;VENTOLIN HFA) 108 (90 Base) MCG/ACT inhaler Inhale 2 puffs into the lungs every 6 (six) hours as needed for wheezing or shortness of breath.      apixaban (ELIQUIS) 5 MG TABS tablet Take 1 tablet (5 mg total) by mouth 2 (two) times daily. 60 tablet 11   carvedilol (COREG) 3.125 MG tablet Take 3.125 mg by mouth 2 (two) times daily.     Cholecalciferol (VITAMIN D3 PO) Take 1 capsule by mouth daily.      Cyanocobalamin (B-12 COMPLIANCE INJECTION) 1000 MCG/ML KIT  Inject 1 mL into the muscle every 14 (fourteen) days.      doxycycline (VIBRAMYCIN) 100 MG capsule Take 1 capsule by mouth 2 (two) times a day.     furosemide (LASIX) 20 MG tablet Take 20 mg by mouth 2 (two) times daily.      glipiZIDE (GLUCOTROL XL) 5 MG 24 hr tablet Take 5 mg by mouth at bedtime.     ipratropium-albuterol (DUONEB) 0.5-2.5 (3) MG/3ML SOLN Take 3 mLs by nebulization every 6 (six) hours as needed (for shortness of breath or wheezing).   2   levothyroxine (EUTHYROX) 25 MCG tablet Take 25 mcg by mouth daily before breakfast.     memantine (NAMENDA) 10 MG tablet Take 1 tablet (10 mg total) by mouth 2 (two) times daily. 60 tablet 6   omeprazole (PRILOSEC) 20 MG capsule Take 20 mg by mouth daily.      potassium chloride SA (K-DUR,KLOR-CON) 20 MEQ tablet Take 20 mEq by mouth daily.     No current facility-administered medications for this visit.     Allergies:   Aspirin, Azithromycin, Infliximab, Methotrexate, Mushroom extract complex, Nsaids, Aricept [donepezil hcl], Avelox [moxifloxacin hcl in nacl], Diovan [valsartan], Amoxicillin, Atorvastatin, Contrast media [iodinated diagnostic agents], Latex, Metformin, Metformin and related, Penicillins, Percocet [oxycodone-acetaminophen], Prednisone, Propofol, Sulfa antibiotics, and Vicodin [hydrocodone-acetaminophen]   Social History:  The patient  reports that she has never smoked. She has never used smokeless tobacco. She reports that she does not drink alcohol or use drugs.   Family History:  The patient's   family history includes Alzheimer's disease in her mother; Diabetes in her brother; Heart attack in her father.   ROS:  Please see the history of present illness.   All other systems are personally reviewed and negative.    Exam:    Vital Signs:  BP (!) 162/88    Pulse 80    Ht 5' 5"  (1.651 m)    Wt 134 lb (60.8 kg)    BMI 22.30 kg/m      Well appearing, alert and conversant, regular work of breathing,  good skin color Eyes- anicteric, neuro- grossly intact, skin- no apparent rash or lesions or cyanosis, mouth- oral mucosa is pink   Labs/Other Tests and Data Reviewed:    Recent Labs: 09/25/2017: Magnesium 2.1 09/26/2017: Hemoglobin 13.9; Platelets 147 11/28/2017: ALT 14; BUN 19; Creatinine, Ser 1.12; NT-Pro BNP 452; Potassium 4.5; Sodium 143; TSH 4.410   Wt Readings from Last 3 Encounters:  08/03/18 134 lb (60.8 kg)  06/11/18 135 lb (61.2 kg)  02/28/18 139 lb (63 kg)     Other studies personally reviewed: Additional studies/ records that were reviewed today include As above   Last device remote is reviewed from [place] PDF dated 4/20 which reveals normal device function,   arrhythmias - atrial fib, and also atrial bigeminy  assoc with loss ov BiV pacing ( ? Encroaching on upper rate limit)  Also egrams suggest there is fusion during most BivPacing    ASSESSMENT & PLAN:    NICM  CRT-P  LV threshold elevated   Afib  RVR   infreq  Anticoagulation apixoban   Rheumatoid arthritis  Pericardial effusion   CVA-recurrent  Anemia  Cardiomyopathy new  ? Recurrent  45-50%   Echocardiogram apparently showed new LV dysfunction. But only modestly so   Will plan to review ECG when she returns to see Dr PR as her LV capture was relatively tenuous   Her allergy list incl ARB's  Her BP is significantly elevated so will increase carvedilol 3.125>>6.25 mg bid   She is also newly anemic and have asked her to followup with PCP about this  COVID 19 screen The patient denies symptoms of COVID 19 at this time.  The importance of social distancing was discussed today.  Follow-up: 18mNext remote: As Scheduled   Current medicines are reviewed at length with the patient today.   The patient has concerns regarding her medicines.  The following changes were made today:  None  Her PCP had asked about Entresto; echocardiogram 5/20 demonstrated EF of 45-50%.  Not indicated.  Labs/ tests ordered today include: none No orders of the defined types were placed in this encounter.   Future tests ( post COVID )    Patient Risk:  after full review of this patients clinical status, I feel that they are at moderate risk at this time.  Today, I have spent 31 minutes minutes with the patient with telehealth technology discussing the above.  More than 50% of 45 min was spent in counseling related to the above   Signed, SVirl Axe MD  08/03/2018 11:45 AM     [facility] [address] [phone]) [phone])

## 2018-08-28 LAB — CUP PACEART REMOTE DEVICE CHECK
Date Time Interrogation Session: 20200714185136
Implantable Lead Implant Date: 20081110
Implantable Lead Implant Date: 20081110
Implantable Lead Implant Date: 20081110
Implantable Lead Location: 753858
Implantable Lead Location: 753859
Implantable Lead Location: 753860
Implantable Pulse Generator Implant Date: 20170207
Pulse Gen Model: 3222
Pulse Gen Serial Number: 7699072

## 2018-08-28 NOTE — Progress Notes (Signed)
PATIENT: Kristin Gomez DOB: 1945/04/17  REASON FOR VISIT: follow up HISTORY FROM: patient  HISTORY OF PRESENT ILLNESS: Today 08/29/18  Kristin Gomez is a 73 year old female with history of memory disorder.  Her last memory score was 22/30.  She remains on Namenda 10 mg twice daily.  She lives with her daughter.  Her daughter manages her medications.  She is able to perform her own ADLs.  She reports a good appetite.  She is not currently driving a car, her license expired.  In the past she was unable to tolerate Aricept due to diarrhea.  She remains on Namenda.  Her daughter manages her finances.  She reports she sleeps well at night.  They deny any agitation or behavioral problems.  Apparently she was in the hospital at [facility] Jul 04, 2018 after a syncopal episode resulting in a fall.  She had CT scan of the brain that showed a left cerebellar infarct, right occipital infarct, likely chronic, although new since 2019.  After that hospitalization her gait was affected, she was exhibiting a shuffling gait.  She is nearly finished with physical therapy that has helped to improve her ambulation.  She is unable to have MRI of the brain because she has a pacemaker, is taking Eliquis.  She was on cholesterol medication, because in the past it worsened her fibromyalgia.  She had follow-up with cardiology 08/03/2018, her carvedilol was increased.  Her echocardiogram showed mildly impaired left ventricular function, EF 45 to 50%.  They report a low hemoglobin at the hospital, but this was apparently rechecked and was found to be improved.  She does report close follow-up with her primary care provider.   HISTORY 1/15/2020CM Ms. 63, 73 year old female returns for follow-up with history of memory difficulties for several years at least since 2000 according to the daughter.  The daughter lives with her mom.  She was given a slow taper of Namenda at her last visit and she is now on 10 mg twice daily.  In  January 2019 she had a sudden decline in memory CT of the brain at [facility] showed no acute changes but chronic small vessel disease.  Some atrophy of the frontal lobes was noted the patient is unable to have a MRI due to her pacemaker.  She was placed on Eliquis at that time but had never been on Plavix before.  The patient is currently driving a vehicle and was advised not to due to her memory loss the daughter keeps up with the finances and writes checks.  Patient can perform her activities of daily living such as feeding dressing and bathing.  She cooks with supervision.  Appetite is good and she sleeps well at night.  No wandering behavior.  No recent falls.  She returns for reevaluation.  REVIEW OF SYSTEMS: Out of a complete 14 system review of symptoms, the patient complains only of the following symptoms, and all other reviewed systems are negative.  Gait abnormality, memory loss  ALLERGIES: Allergies  Allergen Reactions  . Aspirin Shortness Of Breath and Other (See Comments)    Patient states that this caused an asthma attack Patient states that is causes an asthma attack.  . Azithromycin Anaphylaxis  . Infliximab Hives    remicade  . Methotrexate Hives  . Mushroom Extract Complex Anaphylaxis, Shortness Of Breath and Swelling  . Nsaids Shortness Of Breath and Other (See Comments)    Asthma attack  . Aricept [Donepezil Hcl]  Diarrhea  . Avelox [Moxifloxacin Hcl In Nacl] Swelling    Site of swelling not recalled  . Diovan [Valsartan] Swelling    Site of swelling not recalled  . Amoxicillin Rash  . Atorvastatin Swelling and Other (See Comments)    Exacerbated Psoriatic arthritis   . Contrast Media [Iodinated Diagnostic Agents] Rash  . Latex Rash  . Metformin Rash  . Metformin And Related Rash    ??  . Penicillins Rash    Has patient had a PCN reaction causing immediate rash, facial/tongue/throat swelling, SOB or lightheadedness with hypotension: Yes Has patient had a PCN  reaction causing severe rash involving mucus membranes or skin necrosis: No Has patient had a PCN reaction that required hospitalization: No Has patient had a PCN reaction occurring within the last 10 years: No If all of the above answers are "NO", then may proceed with Cephalosporin use.   Marland Kitchen Percocet [Oxycodone-Acetaminophen] Nausea Only  . Prednisone Rash    Can only be tolerated with Benadryl  . Propofol Rash  . Sulfa Antibiotics Nausea Only and Rash  . Vicodin [Hydrocodone-Acetaminophen] Nausea Only    HOME MEDICATIONS: Outpatient Medications Prior to Visit  Medication Sig Dispense Refill  . albuterol (PROVENTIL HFA;VENTOLIN HFA) 108 (90 Base) MCG/ACT inhaler Inhale 2 puffs into the lungs every 6 (six) hours as needed for wheezing or shortness of breath.     Marland Kitchen apixaban (ELIQUIS) 5 MG TABS tablet Take 1 tablet (5 mg total) by mouth 2 (two) times daily. 60 tablet 11  . carvedilol (COREG) 6.25 MG tablet Take 1 tablet (6.25 mg total) by mouth 2 (two) times daily. 180 tablet 3  . Cholecalciferol (VITAMIN D3 PO) Take 1 capsule by mouth daily.     . Cyanocobalamin (B-12 COMPLIANCE INJECTION) 1000 MCG/ML KIT Inject 1 mL into the muscle every 14 (fourteen) days.     . furosemide (LASIX) 20 MG tablet Take 20 mg by mouth 2 (two) times daily.     Marland Kitchen glipiZIDE (GLUCOTROL XL) 5 MG 24 hr tablet Take 5 mg by mouth at bedtime.    Marland Kitchen ipratropium-albuterol (DUONEB) 0.5-2.5 (3) MG/3ML SOLN Take 3 mLs by nebulization every 6 (six) hours as needed (for shortness of breath or wheezing).   2  . levothyroxine (EUTHYROX) 25 MCG tablet Take 25 mcg by mouth daily before breakfast.    . omeprazole (PRILOSEC) 20 MG capsule Take 20 mg by mouth daily.    . potassium chloride SA (K-DUR,KLOR-CON) 20 MEQ tablet Take 20 mEq by mouth daily.    . memantine (NAMENDA) 10 MG tablet Take 1 tablet (10 mg total) by mouth 2 (two) times daily. 60 tablet 6  . methylPREDNISolone (MEDROL) 4 MG tablet Take 4 mg by mouth as needed.     . doxycycline (VIBRAMYCIN) 100 MG capsule Take 1 capsule by mouth 2 (two) times a day.     No facility-administered medications prior to visit.     PAST MEDICAL HISTORY: Past Medical History:  Diagnosis Date  . Arthritis    "all over" (09/26/2017)  . Asthma   . Diabetes mellitus   . Fibromyalgia   . Heart disease   . High cholesterol   . History of blood transfusion    "related to a surgery" (09/26/2017)  . Hypertension   . Memory difficulty 07/26/2017  . Myocardial infarction [facility])    "years ago" (09/26/2017)  . Pacemaker   . 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 Stroke [facility]) 02/2017   "affected  my memory" (09/26/2017)  . Type II diabetes mellitus ([place])     PAST SURGICAL HISTORY: Past Surgical History:  Procedure Laterality Date  . CARDIAC CATHETERIZATION    . CATARACT EXTRACTION W/ INTRAOCULAR LENS  IMPLANT, BILATERAL Bilateral   . CHOLECYSTECTOMY    . COLONOSCOPY    . I&D EXTREMITY  03/31/2011   Procedure: IRRIGATION AND DEBRIDEMENT EXTREMITY;  Surgeon: Tennis Must, MD;  Location: [place];  Service: Orthopedics;  Laterality: Left;  left long finger  . INSERT / REPLACE / REMOVE PACEMAKER    . KNEE ARTHROSCOPY Left   . MASS EXCISION  03/08/2011   Procedure: EXCISION MASS;  Surgeon: Tennis Must, MD;  Location: [facility];  Service: Orthopedics;  Laterality: Left;  Excision Mass Left Long Finger and Debridement of Distal Interphalangeal  Joint  . TONSILLECTOMY    . TOTAL ABDOMINAL HYSTERECTOMY    . TUBAL LIGATION      FAMILY HISTORY: Family History  Problem Relation Age of Onset  . Alzheimer's disease Mother   . Heart attack Father   . Diabetes Brother     SOCIAL HISTORY: Social History   Socioeconomic History  . Marital status: Widowed    Spouse name: Not on file  . Number of children: 2  . Years of education: College  . Highest education level: Not on file  Occupational History  . Not on file  Social Needs  .  Financial resource strain: Not on file  . Food insecurity    Worry: Not on file    Inability: Not on file  . Transportation needs    Medical: Not on file    Non-medical: Not on file  Tobacco Use  . Smoking status: Never Smoker  . Smokeless tobacco: Never Used  Substance and Sexual Activity  . Alcohol use: Never    Frequency: Never  . Drug use: Never  . Sexual activity: Not Currently  Lifestyle  . Physical activity    Days per week: Not on file    Minutes per session: Not on file  . Stress: Not on file  Relationships  . Social Herbalist on phone: Not on file    Gets together: Not on file    Attends religious service: Not on file    Active member of club or organization: Not on file    Attends meetings of clubs or organizations: Not on file    Relationship status: Not on file  . Intimate partner violence    Fear of current or ex partner: Not on file    Emotionally abused: Not on file    Physically abused: Not on file    Forced sexual activity: Not on file  Other Topics Concern  . Not on file  Social History Narrative   Lives with daughter   Caffeine use: diet drinks   diet coke out to eat   Rig thhanded       PHYSICAL EXAM  Vitals:   08/29/18 1431  BP: 116/73  Pulse: 91  Temp: 97.8 F (36.6 C)  TempSrc: Oral  Weight: 137 lb 6.4 oz (62.3 kg)  Height: _0  (1.651 m)   Body mass index is 22.86 kg/m.  Generalized: Well developed, in no acute distress  MMSE - Mini Mental State Exam 08/29/2018 02/28/2018 07/26/2017  Not completed: (No Data) (No Data) -  Orientation to time _1 Orientation to Place _2 Registration 3 3  3  Attention/ Calculation 0 5 2  Recall 2 1 0  Language- name 2 objects _0 Language- repeat _1 Language- follow 3 step command _2 Language- follow 3 step command-comments - She grabbed the paper with both hands -  Language- read & follow direction _3 Write a sentence _4 Copy design 1 1 0  Total score _5 Neurological examination  Mentation: Alert oriented to time, place, history taking, daughter provides collateral history. Follows all commands speech and language fluent Cranial nerve II-XII: Pupils were equal round reactive to light. Extraocular movements were full, visual field were full on confrontational test. Facial sensation and strength were normal. Uvula tongue midline. Head turning and shoulder shrug  were normal and symmetric. Motor: The motor testing reveals 5 over 5 strength of all 4 extremities. Good symmetric motor tone is noted throughout.  Sensory: Sensory testing is intact to soft touch on all 4 extremities. No evidence of extinction is noted.  Coordination: Cerebellar testing reveals good finger-nose-finger and heel-to-shin bilaterally.  Gait and station: Gait is normal. Tandem gait is unsteady Reflexes: Deep tendon reflexes are symmetric and normal bilaterally.   DIAGNOSTIC DATA (LABS, IMAGING, TESTING) - I reviewed patient records, labs, notes, testing and imaging myself where available.  Lab Results  Component Value Date   WBC 9.6 09/26/2017   HGB 13.9 09/26/2017   HCT 41.3 09/26/2017   MCV 97.6 09/26/2017   PLT 147 (L) 09/26/2017      Component Value Date/Time   NA 143 11/28/2017 1148   K 4.5 11/28/2017 1148   CL 103 11/28/2017 1148   CO2 21 11/28/2017 1148   GLUCOSE 118 (H) 11/28/2017 1148   GLUCOSE 127 (H) 09/26/2017 0621   BUN 19 11/28/2017 1148   CREATININE 1.12 (H) 11/28/2017 1148   CALCIUM 8.6 (L) 11/28/2017 1148   PROT 5.6 (L) 11/28/2017 1148   ALBUMIN 3.6 11/28/2017 1148   AST 12 11/28/2017 1148   ALT 14 11/28/2017 1148   ALKPHOS 71 11/28/2017 1148   BILITOT 0.4 11/28/2017 1148   GFRNONAA 49 (L) 11/28/2017 1148   GFRAA 57 (L) 11/28/2017 1148   Lab Results  Component Value Date   CHOL 265 (H) 11/28/2017   HDL 52 11/28/2017   LDLCALC 144 (H) 11/28/2017   TRIG 347 (H) 11/28/2017   CHOLHDL 5.1 (H) 11/28/2017   No results found for:  HGBA1C No results found for: VITAMINB12 Lab Results  Component Value Date   TSH 4.410 11/28/2017    ASSESSMENT AND PLAN 73 y.o. 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 here with:  1.  Memory loss 2.  Syncopal event, 07/04/2018 CT scan of the brain shows left cerebellar infarct, right occipital infarct, new since 03/21/2017 CT scan, cannot have MRI of the brain due to pacemaker.  She has had a mild decline in her memory since last visit.  She will continue taking Namenda.  She had a hospitalization at [facility] Jul 04, 2018 for syncope.  She had a CT scan of the brain that showed a left cerebellar infarct, and right occipital infarct, new since last CT scan 03/31/2017.  She remains on Eliquis for history of  paroxysmal A. fib, has pacemaker placement.  She follows with her primary care provider for routine care.  She has decided to not go on cholesterol medication due to worsening of her fibromyalgia symptoms in the past.  She remains on carvedilol, and glipizide.  She has follow-up with cardiology, recent echo showed EF 45 to 50%.  Since the event in May, she reports her gait was affected, having a shuffling gait.  She has nearly completed physical therapy that has been beneficial.  Clinically on exam, she does not have any weakness, her gait is essentially normal, mildly unsteady.  She does report a rash the daughter is concerned may be shingles, they have a home health nurse who will be coming to look at the rash.  She will follow-up in our office in 6 months or sooner if needed.  I advised her to continue close follow-up with primary care, and cardiology.  She will let us know if her  symptoms worsen or she develops any new symptoms.  I did review the CT scan report with Dr. Jannifer Franklin, he recommends getting CTA head and neck to evaluate her posterior circulation. I called the daughter, and she agreed. Her last creatinine 08/06/2018 was 1.12, GFR 49.   I spent 25 minutes with the patient. 50% of this time was spent discussing her plan of care and reviewing her record.    Butler Denmark, AGNP-C, DNP 08/29/2018, 4:44 PM [facility] [address] [place], [place] [address] [phone]

## 2018-08-29 ENCOUNTER — Ambulatory Visit (INDEPENDENT_AMBULATORY_CARE_PROVIDER_SITE_OTHER): Payer: Medicare HMO | Admitting: Neurology

## 2018-08-29 ENCOUNTER — Other Ambulatory Visit: Payer: Self-pay

## 2018-08-29 ENCOUNTER — Ambulatory Visit (INDEPENDENT_AMBULATORY_CARE_PROVIDER_SITE_OTHER): Payer: Medicare HMO | Admitting: *Deleted

## 2018-08-29 ENCOUNTER — Encounter: Payer: Self-pay | Admitting: Neurology

## 2018-08-29 VITALS — BP 116/73 | HR 91 | Temp 97.8°F | Ht 65.0 in | Wt 137.4 lb

## 2018-08-29 DIAGNOSIS — I639 Cerebral infarction, unspecified: Secondary | ICD-10-CM

## 2018-08-29 DIAGNOSIS — I480 Paroxysmal atrial fibrillation: Secondary | ICD-10-CM | POA: Diagnosis not present

## 2018-08-29 DIAGNOSIS — R413 Other amnesia: Secondary | ICD-10-CM

## 2018-08-29 MED ORDER — MEMANTINE HCL 10 MG PO TABS: 10.0000 mg | ORAL_TABLET | Freq: Two times a day (BID) | ORAL | 6 refills | Status: DC

## 2018-08-29 NOTE — Progress Notes (Signed)
I have read the note, and I agree with the clinical assessment and plan.  Sharesa Kemp K Camilla Skeen   

## 2018-08-29 NOTE — Patient Instructions (Signed)
It was wonderful to meet you both!

## 2018-08-30 ENCOUNTER — Telehealth: Payer: Self-pay | Admitting: Neurology

## 2018-08-30 NOTE — Telephone Encounter (Signed)
Mcarthur Rossetti Josem Kaufmann: [phone] (exp. 08/30/18 to 09/29/18) order sent to GI. They will reach out to the patient to schedule.

## 2018-09-04 ENCOUNTER — Telehealth: Payer: Self-pay | Admitting: Nurse Practitioner

## 2018-09-04 NOTE — Progress Notes (Signed)
Phone call to patient (spoke with daughter, who is care provider) to review instructions for 13 hr prep for CT w/ contrast on 7/24 at 1300. Prescription called into [facility] in [place]. Pt's daughter aware and verbalized understanding of instructions. Prescription: 0000- 50mg  Prednisone 0600- 50mg  Prednisone 1200- 50mg  Prednisone and 50mg  Benadryl

## 2018-09-07 ENCOUNTER — Ambulatory Visit
Admission: RE | Admit: 2018-09-07 | Discharge: 2018-09-07 | Disposition: A | Discharge status: Home / Self Care | Payer: Medicare HMO | Source: Ambulatory Visit | Attending: Neurology | Admitting: Neurology

## 2018-09-07 ENCOUNTER — Other Ambulatory Visit: Payer: Medicare HMO

## 2018-09-07 DIAGNOSIS — I639 Cerebral infarction, unspecified: Secondary | ICD-10-CM

## 2018-09-07 MED ORDER — IOPAMIDOL (ISOVUE-370) INJECTION 76%
75.0000 mL | Freq: Once | INTRAVENOUS | Status: AC | PRN
  Administered 2018-09-07: 75 mL via INTRAVENOUS

## 2018-09-08 ENCOUNTER — Encounter: Payer: Self-pay | Admitting: Cardiology

## 2018-09-08 NOTE — Progress Notes (Signed)
Remote pacemaker transmission.   

## 2018-09-11 ENCOUNTER — Telehealth: Payer: Self-pay | Admitting: Neurology

## 2018-09-11 NOTE — Telephone Encounter (Signed)
Phone Dr. Celedonio Miyamoto in [place], [place].

## 2018-09-11 NOTE — Telephone Encounter (Signed)
I called the patient's daughters [name] and [place]) to discuss CTA head and neck findings. I reviewed the findings with Dr. Jannifer Franklin. Based on the findings, I would suggest aspirin, however patient has severe allergy causing asthma attack. She remains on Eliquis and has pacemaker. She should follow with her primary care provider for management of risk factors including, HTN, and cholesterol. The study does not reveal any findings that  intervention can be done.  There does not appear to be significant stenosis of her carotid arteries.  She has a strongly dominant left vertebral artery without significant stenosis.  They will follow-up with her primary care doctor, I will send a report of the scan at their request to Dr. Celedonio Miyamoto in [place], [place].   CT angio head and neck 09/07/2018 IMPRESSION: 1. Intracranial atherosclerosis including severe bilateral proximal PCA stenoses with diffuse attenuation of both PCAs more distally. 2. Mild left M1 and intracranial ICA stenoses. 3. Mild bilateral cervical carotid artery atherosclerosis without significant stenosis. 4. Strongly dominant left vertebral artery without significant stenosis. 5. Chronic left cerebellar, right occipital, and right basal ganglia infarcts. 6.  Aortic Atherosclerosis (ICD10-I70.0).

## 2018-09-12 NOTE — Telephone Encounter (Signed)
Fax confirmation received [phone] with CTA head and neck.

## 2018-10-26 NOTE — Progress Notes (Signed)
Cardiology Office Note   Date:  10/29/2018   ID:  Kristin Gomez, DOB 1945-04-10, MRN [id number]  PCP:  System, Pcp Not In  Cardiologist:   Dorris Carnes, MD   Pt presents for f/u of CHF      History of Present Illness: [name] is a 73 y.o. female with a history of HTN, PPM, CHF, COPD, DM Type II, HL   Echo done showed pericardial effusion   Took Prednisone dose pack    After visit I recomm a repeat echo to reeval LVEF and also PE   Echo on Dec 02, 2017 showed no PE  LVEF was 35 to 40%    I saw her in clinic in  Jan 2020 and as a televisit in APril 2020 She says her breating gets short with activity   She denies palpitations   No dizziness No CP   She is currently recovering from shingles      Current Meds  Medication Sig  . albuterol (PROVENTIL HFA;VENTOLIN HFA) 108 (90 Base) MCG/ACT inhaler Inhale 2 puffs into the lungs every 6 (six) hours as needed for wheezing or shortness of breath.   Marland Kitchen amitriptyline (ELAVIL) 10 MG tablet Take 30 mg by mouth at bedtime.  Marland Kitchen apixaban (ELIQUIS) 5 MG TABS tablet Take 1 tablet (5 mg total) by mouth 2 (two) times daily.  . carvedilol (COREG) 6.25 MG tablet Take 1 tablet (6.25 mg total) by mouth 2 (two) times daily.  . Cholecalciferol (VITAMIN D3 PO) Take 1 capsule by mouth daily.   . Cyanocobalamin (B-12 COMPLIANCE INJECTION) 1000 MCG/ML KIT Inject 1 mL into the muscle every 14 (fourteen) days.   . furosemide (LASIX) 20 MG tablet Take 20 mg by mouth 2 (two) times daily.   Marland Kitchen glipiZIDE (GLUCOTROL XL) 5 MG 24 hr tablet Take 5 mg by mouth at bedtime.  Marland Kitchen ipratropium-albuterol (DUONEB) 0.5-2.5 (3) MG/3ML SOLN Take 3 mLs by nebulization every 6 (six) hours as needed (for shortness of breath or wheezing).   Marland Kitchen levothyroxine (EUTHYROX) 25 MCG tablet Take 25 mcg by mouth daily before breakfast.  . memantine (NAMENDA) 10 MG tablet Take 1 tablet (10 mg total) by mouth 2 (two) times daily.  . methylPREDNISolone (MEDROL) 4 MG tablet Take 4 mg by mouth as  needed.  Marland Kitchen omeprazole (PRILOSEC) 20 MG capsule Take 20 mg by mouth daily.  . potassium chloride SA (K-DUR,KLOR-CON) 20 MEQ tablet Take 20 mEq by mouth daily.  Marland Kitchen triamcinolone cream (KENALOG) 0.1 % Apply 1 application topically 2 (two) times daily.     Allergies:   Aspirin, Azithromycin, Infliximab, Methotrexate, Mushroom extract complex, Nsaids, Aricept [donepezil hcl], Avelox [moxifloxacin hcl in nacl], Diovan [valsartan], Amoxicillin, Atorvastatin, Contrast media [iodinated diagnostic agents], Latex, Metformin, Metformin and related, Penicillins, Percocet [oxycodone-acetaminophen], Prednisone, Propofol, Sulfa antibiotics, and Vicodin [hydrocodone-acetaminophen]   Past Medical History:  Diagnosis Date  . Arthritis    "all over" (09/26/2017)  . Asthma   . Diabetes mellitus   . Fibromyalgia   . Heart disease   . High cholesterol   . History of blood transfusion    "related to a surgery" (09/26/2017)  . Hypertension   . Memory difficulty 07/26/2017  . Myocardial infarction [facility])    "years ago" (09/26/2017)  . Pacemaker   . 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 Stroke [facility]) 02/2017   "affected my memory" (09/26/2017)  . Type II diabetes mellitus ([place])     Past Surgical History:  Procedure  Laterality Date  . CARDIAC CATHETERIZATION    . CATARACT EXTRACTION W/ INTRAOCULAR LENS  IMPLANT, BILATERAL Bilateral   . CHOLECYSTECTOMY    . COLONOSCOPY    . I&D EXTREMITY  03/31/2011   Procedure: IRRIGATION AND DEBRIDEMENT EXTREMITY;  Surgeon: Tennis Must, MD;  Location: [facility];  Service: Orthopedics;  Laterality: Left;  left long finger  . INSERT / REPLACE / REMOVE PACEMAKER    . KNEE ARTHROSCOPY Left   . MASS EXCISION  03/08/2011   Procedure: EXCISION MASS;  Surgeon: Tennis Must, MD;  Location: [facility];  Service: Orthopedics;  Laterality: Left;  Excision Mass Left Long Finger and Debridement of Distal Interphalangeal  Joint  . TONSILLECTOMY     . TOTAL ABDOMINAL HYSTERECTOMY    . TUBAL LIGATION       Social History:  The patient  reports that she has never smoked. She has never used smokeless tobacco. She reports that she does not drink alcohol or use drugs.   Family History:  The patient's family history includes Alzheimer's disease in her mother; Diabetes in her brother; Heart attack in her father.    ROS:  Please see the history of present illness. All other systems are reviewed and  Negative to the above problem except as noted.    PHYSICAL EXAM: VS:  BP 119/78   Pulse 67   Ht 5' 5"  (1.651 m)   Wt 137 lb 6.4 oz (62.3 kg)   BMI 22.86 kg/m   GEN: Well nourished, well developed, in no acute distress  HEENT: normal  Neck:JVP is not elevated  Cardiac:  Irreg irreg  ; no murmurs, rubs, or gallops  Triv edema   Foot cool   Respiratory:  clear to auscultation bilaterally, normal work of breathing GI: soft  Not, nondistended, + BS  No hepatomegaly   Mild diffuse tenderness MS: no deformity Moving all extremities   Skin: warm and dry  One healing lesion from shingles epigastric area  Multiple bruises   Skin in legs dusky     EKG:  EKG is not ordered today.   Lipid Panel    Component Value Date/Time   CHOL 265 (H) 11/28/2017 1148   TRIG 347 (H) 11/28/2017 1148   HDL 52 11/28/2017 1148   CHOLHDL 5.1 (H) 11/28/2017 1148   LDLCALC 144 (H) 11/28/2017 1148      Wt Readings from Last 3 Encounters:  10/29/18 137 lb 6.4 oz (62.3 kg)  08/29/18 137 lb 6.4 oz (62.3 kg)  08/03/18 134 lb (60.8 kg)      ASSESSMENT AND PLAN    1   Chronic systolic CHF Last   Echo LVEF 35 to 40%    Echo today LVEF actually appears impproved  Volume status is OK   Will get labs  REconsider meds based on results    2  Periccardial effusion  Resolved  3  Hx PAF  Continue Eliquis  Clinically appears to be in afib today   Check CBC, BMET, TSH, lipids     F/U in 2 months    Current medicines are reviewed at length with the patient today.   The patient does not have concerns regarding medicines.  Signed, Dorris Carnes, MD  10/29/2018 3:18 PM    [address], [place], [place]  [address] Phone: [phone]; Fax: [phone]

## 2018-10-29 ENCOUNTER — Encounter (INDEPENDENT_AMBULATORY_CARE_PROVIDER_SITE_OTHER): Payer: Self-pay

## 2018-10-29 ENCOUNTER — Encounter: Payer: Self-pay | Admitting: Internal Medicine

## 2018-10-29 ENCOUNTER — Ambulatory Visit (HOSPITAL_COMMUNITY): Payer: Medicare HMO | Attending: Cardiology

## 2018-10-29 ENCOUNTER — Ambulatory Visit (INDEPENDENT_AMBULATORY_CARE_PROVIDER_SITE_OTHER): Payer: Medicare HMO | Admitting: Internal Medicine

## 2018-10-29 ENCOUNTER — Other Ambulatory Visit: Payer: Self-pay

## 2018-10-29 VITALS — BP 119/78 | HR 67 | Ht 65.0 in | Wt 137.4 lb

## 2018-10-29 DIAGNOSIS — I5022 Chronic systolic (congestive) heart failure: Secondary | ICD-10-CM | POA: Diagnosis present

## 2018-10-29 DIAGNOSIS — I480 Paroxysmal atrial fibrillation: Secondary | ICD-10-CM

## 2018-10-29 DIAGNOSIS — I10 Essential (primary) hypertension: Secondary | ICD-10-CM | POA: Diagnosis not present

## 2018-10-29 NOTE — Patient Instructions (Signed)
Medication Instructions:  No changes If you need a refill on your cardiac medications before your next appointment, please call your pharmacy.   Lab work: Today: bmet, lipids, cbc If you have labs (blood work) drawn today and your tests are completely normal, you will receive your results only by: Marland Kitchen MyChart Message (if you have MyChart) OR . A paper copy in the mail If you have any lab test that is abnormal or we need to change your treatment, we will call you to review the results.  Testing/Procedures: none  Follow-Up: At [facility], you and your health needs are our priority.  As part of our continuing mission to provide you with exceptional heart care, we have created designated Provider Care Teams.  These Care Teams include your primary Cardiologist (physician) and Advanced Practice Providers (APPs -  Physician Assistants and Nurse Practitioners) who all work together to provide you with the care you need, when you need it. You will need a follow up appointment in:  6 months.  Please call our office 2 months in advance to schedule this appointment.  You may see Dorris Carnes, MD or one of the following Advanced Practice Providers on your designated Care Team: Richardson Dopp, PA-C [place], [place] . Daune Perch, NP  Any Other Special Instructions Will Be Listed Below (If Applicable).

## 2018-10-30 LAB — CBC
Hematocrit: 43.4 % (ref 34.0–46.6)
Hemoglobin: 14.8 g/dL (ref 11.1–15.9)
MCH: 31.4 pg (ref 26.6–33.0)
MCHC: 34.1 g/dL (ref 31.5–35.7)
MCV: 92 fL (ref 79–97)
Platelets: 297 10*3/uL (ref 150–450)
RBC: 4.71 x10E6/uL (ref 3.77–5.28)
RDW: 12.9 % (ref 11.7–15.4)
WBC: 11.5 10*3/uL — ABNORMAL HIGH (ref 3.4–10.8)

## 2018-10-30 LAB — LIPID PANEL
Chol/HDL Ratio: 5.4 ratio — ABNORMAL HIGH (ref 0.0–4.4)
Cholesterol, Total: 283 mg/dL — ABNORMAL HIGH (ref 100–199)
HDL: 52 mg/dL (ref >39)
LDL Chol Calc (NIH): 187 mg/dL — ABNORMAL HIGH (ref 0–99)
Triglycerides: 231 mg/dL — ABNORMAL HIGH (ref 0–149)
VLDL Cholesterol Cal: 44 mg/dL — ABNORMAL HIGH (ref 5–40)

## 2018-10-30 LAB — BASIC METABOLIC PANEL
BUN/Creatinine Ratio: 18 (ref 12–28)
BUN: 24 mg/dL (ref 8–27)
CO2: 22 mmol/L (ref 20–29)
Calcium: 9.2 mg/dL (ref 8.7–10.3)
Chloride: 105 mmol/L (ref 96–106)
Creatinine, Ser: 1.3 mg/dL — ABNORMAL HIGH (ref 0.57–1.00)
GFR calc Af Amer: 47 mL/min/{1.73_m2} — ABNORMAL LOW (ref >59)
GFR calc non Af Amer: 41 mL/min/{1.73_m2} — ABNORMAL LOW (ref >59)
Glucose: 130 mg/dL — ABNORMAL HIGH (ref 65–99)
Potassium: 4.2 mmol/L (ref 3.5–5.2)
Sodium: 144 mmol/L (ref 134–144)

## 2018-11-08 ENCOUNTER — Telehealth: Payer: Self-pay | Admitting: *Deleted

## 2018-11-08 MED ORDER — ROSUVASTATIN CALCIUM 20 MG PO TABS: 20.0000 mg | ORAL_TABLET | Freq: Every day | ORAL | 3 refills | Status: DC

## 2018-11-08 MED ORDER — FUROSEMIDE 20 MG PO TABS: 20.0000 mg | ORAL_TABLET | Freq: Every day | ORAL | 3 refills | Status: DC

## 2018-11-08 NOTE — Telephone Encounter (Signed)
Spoke to patient's daughter [name].  Went over results/review and recommendations by Dr. Harrington Challenger. They will decrease lasix 20 mg to once day and agree to start Crestor 20 mg daily.  The lipitor exacerbated psoriatic arthritis and fibromyalgia.  There was no angioedema per both of patient's daughters (one was in background).  Will mail prescription to patient's home for repeat lipids in 8 weeks.  They prefer to take to PCP office (Dr. Trudie Reed) to have drawn with results faxed to Dr. Harrington Challenger.

## 2018-11-08 NOTE — Telephone Encounter (Signed)
-----   Message from Fay Records, MD sent at 10/31/2018  3:06 PM EDT ----- I would recomm cutting lasix down to 20 mg daily   Cr is up from 1 year ago CBC is OK LIpids are extremely high  LDL 187   Looks like lipitor exacerbated Psoriatic arthritis   Please confirm that and not angioedema If that then I would recommCrestor 20 mg      F?Uliopids in 8 wks

## 2018-11-28 ENCOUNTER — Encounter: Payer: Medicare HMO | Admitting: *Deleted

## 2018-12-11 ENCOUNTER — Ambulatory Visit (INDEPENDENT_AMBULATORY_CARE_PROVIDER_SITE_OTHER): Payer: Medicare HMO | Admitting: *Deleted

## 2018-12-11 DIAGNOSIS — I639 Cerebral infarction, unspecified: Secondary | ICD-10-CM | POA: Diagnosis not present

## 2018-12-11 DIAGNOSIS — I5022 Chronic systolic (congestive) heart failure: Secondary | ICD-10-CM

## 2018-12-11 LAB — CUP PACEART REMOTE DEVICE CHECK
Battery Remaining Longevity: 1 mo
Battery Remaining Percentage: 0.5 %
Battery Voltage: 2.63 V
Brady Statistic AP VP Percent: 17 %
Brady Statistic AP VS Percent: 1 %
Brady Statistic AS VP Percent: 76 %
Brady Statistic AS VS Percent: 6.2 %
Brady Statistic RA Percent Paced: 16 %
Date Time Interrogation Session: 20201027080014
Implantable Lead Implant Date: 20081110
Implantable Lead Implant Date: 20081110
Implantable Lead Implant Date: 20081110
Implantable Lead Location: 753858
Implantable Lead Location: 753859
Implantable Lead Location: 753860
Implantable Pulse Generator Implant Date: 20170207
Lead Channel Impedance Value: 410 Ohm
Lead Channel Impedance Value: 430 Ohm
Lead Channel Impedance Value: 780 Ohm
Lead Channel Pacing Threshold Amplitude: 0.625 V
Lead Channel Pacing Threshold Amplitude: 1.25 V
Lead Channel Pacing Threshold Amplitude: 2.75 V
Lead Channel Pacing Threshold Pulse Width: 0.4 ms
Lead Channel Pacing Threshold Pulse Width: 0.4 ms
Lead Channel Pacing Threshold Pulse Width: 1.5 ms
Lead Channel Sensing Intrinsic Amplitude: 11.2 mV
Lead Channel Sensing Intrinsic Amplitude: 2.6 mV
Lead Channel Setting Pacing Amplitude: 1.625
Lead Channel Setting Pacing Amplitude: 2.25 V
Lead Channel Setting Pacing Amplitude: 3.5 V
Lead Channel Setting Pacing Pulse Width: 0.4 ms
Lead Channel Setting Pacing Pulse Width: 1.5 ms
Lead Channel Setting Sensing Sensitivity: 2 mV
Pulse Gen Model: 3222
Pulse Gen Serial Number: 7699072

## 2018-12-13 NOTE — Progress Notes (Signed)
Cardiology Office Note Date:  12/14/2018  Patient ID:  Bobbie, Virden 04/01/1945, MRN [id number] PCP:  Bonnita Nasuti, MD  Cardiologist:  Dr. Harrington Challenger Electrophysiologist: Dr. Caryl Comes    Chief Complaint: scheduled follow up  History of Present Illness: SUETTA HOFFMEISTER is a 73 y.o. female with history of HTN, COPD, DM, h/o pericardial effusion that resolved, chronic CHF (systolic) presumably NICM, PAFib, stroke,  CRT-P, RA  She comes in today to be seen for Dr. Caryl Comes.  Last seen by him via tele-health, June 2020, he mentioned increased LV lead threshold and new decreased LVEF.  Recommended revisiting echo with better LV lead function post programming adjustments (?ineffective LV pacing).  Also planned for PMD f/u for her anemia. She saw Dr. Harrington Challenger 10/2018, pt was c/o DOE, she mentions the echo done that day, LVEF looked  Improved, effusion remained resolved.  She was felt to be in Afib, planned for labs, and a 2 mo follow up   She is accompanied by her daughter today.  She is doing "OK", c/w the same DOE, level of exertional capacity as she has been for some years now.  No rest SOB, no symptoms of PND or orthopnea.  No CP, palpitations.  No Near syncope or syncope.  10/29/2018 K+ 4.2 Creat 1.30 H/H 14/43   Device information: SJM CRT-P, implanted 12/25/2006 > gen change 2017    Past Medical History:  Diagnosis Date  . Arthritis    "all over" (09/26/2017)  . Asthma   . Diabetes mellitus   . Fibromyalgia   . Heart disease   . High cholesterol   . History of blood transfusion    "related to a surgery" (09/26/2017)  . Hypertension   . Memory difficulty 07/26/2017  . Myocardial infarction [facility])    "years ago" (09/26/2017)  . Pacemaker   . 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 Stroke [facility]) 02/2017   "affected my memory" (09/26/2017)  . Type II diabetes mellitus ([place])     Past Surgical History:  Procedure Laterality Date  . CARDIAC CATHETERIZATION    . CATARACT  EXTRACTION W/ INTRAOCULAR LENS  IMPLANT, BILATERAL Bilateral   . CHOLECYSTECTOMY    . COLONOSCOPY    . I&D EXTREMITY  03/31/2011   Procedure: IRRIGATION AND DEBRIDEMENT EXTREMITY;  Surgeon: Tennis Must, MD;  Location: [place];  Service: Orthopedics;  Laterality: Left;  left long finger  . INSERT / REPLACE / REMOVE PACEMAKER    . KNEE ARTHROSCOPY Left   . MASS EXCISION  03/08/2011   Procedure: EXCISION MASS;  Surgeon: Tennis Must, MD;  Location: [place];  Service: Orthopedics;  Laterality: Left;  Excision Mass Left Long Finger and Debridement of Distal Interphalangeal  Joint  . TONSILLECTOMY    . TOTAL ABDOMINAL HYSTERECTOMY    . TUBAL LIGATION      Current Outpatient Medications  Medication Sig Dispense Refill  . albuterol (PROVENTIL HFA;VENTOLIN HFA) 108 (90 Base) MCG/ACT inhaler Inhale 2 puffs into the lungs every 6 (six) hours as needed for wheezing or shortness of breath.     Marland Kitchen amitriptyline (ELAVIL) 10 MG tablet Take 30 mg by mouth at bedtime.    Marland Kitchen apixaban (ELIQUIS) 5 MG TABS tablet Take 1 tablet (5 mg total) by mouth 2 (two) times daily. 60 tablet 11  . carvedilol (COREG) 12.5 MG tablet Take 1 tablet (12.5 mg total) by mouth 2 (two) times daily. 180 tablet 3  .  Cholecalciferol (VITAMIN D3 PO) Take 1 capsule by mouth daily.     . Cyanocobalamin (B-12 COMPLIANCE INJECTION) 1000 MCG/ML KIT Inject 1 mL into the muscle every 14 (fourteen) days.     . furosemide (LASIX) 20 MG tablet Take 1 tablet (20 mg total) by mouth daily. 90 tablet 3  . glipiZIDE (GLUCOTROL XL) 5 MG 24 hr tablet Take 5 mg by mouth at bedtime.    Marland Kitchen ipratropium-albuterol (DUONEB) 0.5-2.5 (3) MG/3ML SOLN Take 3 mLs by nebulization every 6 (six) hours as needed (for shortness of breath or wheezing).   2  . levothyroxine (EUTHYROX) 25 MCG tablet Take 25 mcg by mouth daily before breakfast.    . memantine (NAMENDA) 10 MG tablet Take 1 tablet (10 mg total) by mouth 2 (two) times daily. 60  tablet 6  . methylPREDNISolone (MEDROL) 4 MG tablet Take 4 mg by mouth as needed.    Marland Kitchen omeprazole (PRILOSEC) 20 MG capsule Take 20 mg by mouth daily.    . potassium chloride SA (K-DUR,KLOR-CON) 20 MEQ tablet Take 20 mEq by mouth daily.    . rosuvastatin (CRESTOR) 20 MG tablet Take 1 tablet (20 mg total) by mouth daily. 90 tablet 3  . triamcinolone cream (KENALOG) 0.1 % Apply 1 application topically 2 (two) times daily.     No current facility-administered medications for this visit.     Allergies:   Aspirin, Azithromycin, Infliximab, Methotrexate, Mushroom extract complex, Nsaids, Aricept [donepezil hcl], Avelox [moxifloxacin hcl in nacl], Diovan [valsartan], Amoxicillin, Atorvastatin, Contrast media [iodinated diagnostic agents], Latex, Metformin, Metformin and related, Penicillins, Percocet [oxycodone-acetaminophen], Prednisone, Propofol, Sulfa antibiotics, and Vicodin [hydrocodone-acetaminophen]   Social History:  The patient  reports that she has never smoked. She has never used smokeless tobacco. She reports that she does not drink alcohol or use drugs.   Family History:  The patient's family history includes Alzheimer's disease in her mother; Diabetes in her brother; Heart attack in her father.  ROS:  Please see the history of present illness.   All other systems are reviewed and otherwise negative.   PHYSICAL EXAM:  VS:  BP 106/68   Ht 5' 4"  (1.626 m)   Wt 142 lb (64.4 kg)   BMI 24.37 kg/m  BMI: Body mass index is 24.37 kg/m. Well nourished, well developed, in no acute distress  HEENT: normocephalic, atraumatic  Neck: no JVD, carotid bruits or masses Cardiac:  RRR; extrasystoles noted, no significant murmurs, no rubs, or gallops Lungs:  CTA b/l, no wheezing, rhonchi or rales  Abd: soft, nontender MS: no deformity, age appropriate atrophy Ext: no edema  Skin: warm and dry, psoriasis Neuro:  No gross deficits appreciated Psych: euthymic mood, full affect  PPM site is  stable, no tethering or discomfort, she notes it sits slightly sideways at times   EKG:  Not done today PPM interrogation done today and reviewed by myself:  Battery : ERI will be reach 0-3 months, at 2.63V currently Lead measurements are good HVR episodes are 1:1, PATs, Afib w/RVR, nothing more then 2 minutes most seconds only AMS is AF, burden is <1% HR histogram 70's-110, majority 80-100   10/29/2018: TTE IMPRESSIONS  1. The left ventricle has low normal systolic function, with an ejection fraction of 50-55%. The cavity size was normal. There is moderate asymmetric left ventricular hypertrophy of the septal wall. Indeterminate diastolic filling due to E-A fusion.  Elevated left atrial and left ventricular end-diastolic pressures The E/e' is 20.8. There is abnormal septal motion  consistent with RV pacemaker.  2. The right ventricle has normal systolic function. The cavity was normal. There is no increase in right ventricular wall thickness. Right ventricular systolic pressure is normal with an estimated pressure of 17.3 mmHg.  3. Left atrial size was mildly dilated.  4. Trivial pericardial effusion is present.  5. There is mild to moderate mitral annular calcification present. No evidence of mitral valve stenosis.  6. The aortic valve has an indeterminate number of cusps. No stenosis of the aortic valve.  7. The aorta is normal unless otherwise noted.  8. The aortic root, ascending aorta and aortic arch are normal in size and structure.  07/04/2018: LVEF [facility]), LVEF 45-50% 2013: LVEF 35-40%  Recent Labs: 10/29/2018: BUN 24; Creatinine, Ser 1.30; Hemoglobin 14.8; Platelets 297; Potassium 4.2; Sodium 144  10/29/2018: Chol/HDL Ratio 5.4; Cholesterol, Total 283; HDL 52; LDL Chol Calc (NIH) 187; Triglycerides 231   CrCl cannot be calculated (Patient's most recent lab result is older than the maximum 21 days allowed.).   Wt Readings from Last 3 Encounters:  12/14/18 142 lb (64.4  kg)  10/29/18 137 lb 6.4 oz (62.3 kg)  08/29/18 137 lb 6.4 oz (62.3 kg)     Other studies reviewed: Additional studies/records reviewed today include: summarized above  ASSESSMENT AND PLAN:  1. CRT-P     Intact function, will reach ERI soon   2. Paaroxysmal Afib     CHA2DS2Vasc is 6 (7 with female), on Eliquis, appropriately dosed  3. NICM 4. Chronic CHF (systolic), with recovered LVEF     No symptoms or exam findings to suggest fluid OL     CorView is at threshold, trennding up  5. HTN     Recheck on her BP 110/60     They check daily vitals at home, reporting prior to meds SBP 110-130 and HR typically 90's (no checks afterwards     BP should tolerate more coreg, she has PATs, PAF that is reducing BV pacing % (93%)      Increase coreg 12.33m BID       Disposition: F/u with Dr. KCaryl Comesin [address] if needed, likely will have reached ERI by then, hopefully HR better  Current medicines are reviewed at length with the patient today.  The patient did not have any concerns regarding medicines.  SiVenetia NightPA-C 12/14/2018 1:24 PM     [facility] [phone])  ([phone])

## 2018-12-14 ENCOUNTER — Other Ambulatory Visit: Payer: Self-pay

## 2018-12-14 ENCOUNTER — Ambulatory Visit: Payer: Medicare HMO | Admitting: Physician Assistant

## 2018-12-14 ENCOUNTER — Encounter (INDEPENDENT_AMBULATORY_CARE_PROVIDER_SITE_OTHER): Payer: Self-pay

## 2018-12-14 VITALS — BP 106/68 | Ht 64.0 in | Wt 142.0 lb

## 2018-12-14 DIAGNOSIS — Z950 Presence of cardiac pacemaker: Secondary | ICD-10-CM | POA: Diagnosis not present

## 2018-12-14 DIAGNOSIS — I10 Essential (primary) hypertension: Secondary | ICD-10-CM | POA: Diagnosis not present

## 2018-12-14 DIAGNOSIS — I428 Other cardiomyopathies: Secondary | ICD-10-CM

## 2018-12-14 DIAGNOSIS — I480 Paroxysmal atrial fibrillation: Secondary | ICD-10-CM | POA: Diagnosis not present

## 2018-12-14 MED ORDER — CARVEDILOL 12.5 MG PO TABS: 12.5000 mg | ORAL_TABLET | Freq: Two times a day (BID) | ORAL | 3 refills | Status: DC

## 2018-12-14 NOTE — Patient Instructions (Signed)
Medication Instructions:   START TAKING COREG 12.5 MG  TWICE A DAY   *If you need a refill on your cardiac medications before your next appointment, please call your pharmacy*  Lab Work: NONE ORDERED  TODAY   If you have labs (blood work) drawn today and your tests are completely normal, you will receive your results only by: Marland Kitchen MyChart Message (if you have MyChart) OR . A paper copy in the mail If you have any lab test that is abnormal or we need to change your treatment, we will call you to review the results.  Testing/Procedures: NONE ORDERED  TODAY    Follow-Up: At [facility], you and your health needs are our priority.  As part of our continuing mission to provide you with exceptional heart care, we have created designated Provider Care Teams.  These Care Teams include your primary Cardiologist (physician) and Advanced Practice Providers (APPs -  Physician Assistants and Nurse Practitioners) who all work together to provide you with the care you need, when you need it.  Your next appointment:   3 months  The format for your next appointment:   In Person  Provider:   You may see None Dr. Caryl Comes or one of the following Advanced Practice Providers on your designated Care Team:    Chanetta Marshall, NP  Tommye Standard, PA-C  Legrand Como "[place], [place]   Other Instructions

## 2018-12-27 ENCOUNTER — Telehealth: Payer: Self-pay | Admitting: Internal Medicine

## 2018-12-27 NOTE — Telephone Encounter (Signed)
Pacemaker reached ERI 12-26-18. Will route to Dr Caryl Comes for review and scheduling of gen change.   Chanetta Marshall, NP 12/27/2018 10:58 AM

## 2018-12-28 NOTE — Telephone Encounter (Signed)
Thanks

## 2018-12-31 ENCOUNTER — Telehealth: Payer: Self-pay | Admitting: Internal Medicine

## 2018-12-31 DIAGNOSIS — Z950 Presence of cardiac pacemaker: Secondary | ICD-10-CM

## 2018-12-31 DIAGNOSIS — I480 Paroxysmal atrial fibrillation: Secondary | ICD-10-CM

## 2018-12-31 NOTE — Telephone Encounter (Signed)
New Message  Patient's daughter is returning call for [place]  Please call back

## 2018-12-31 NOTE — Telephone Encounter (Signed)
LVM with daughter for return call. Need to discuss date/time for change out.

## 2018-12-31 NOTE — Telephone Encounter (Signed)
Pts daughter, [name], scheduled PPM change out for 12/04. Pre procedure medications and instructions reviewed with daughter. COVID and lab appts reviewed. Follow up appt's made.  See letter.  No additional questions.

## 2019-01-02 NOTE — Progress Notes (Signed)
Remote pacemaker transmission.   

## 2019-01-13 IMAGING — CT CT ABD-PELV W/O CM
2 of 4 series · 16 of 46 positions shown, 18 images · non-contrast
Comparison: 12/28/2013

CLINICAL DATA: Generalize weakness. Diarrhea for 2 weeks. Dizziness
and near-syncope.

EXAM:
CT ABDOMEN AND PELVIS WITHOUT CONTRAST
TECHNIQUE: Multidetector CT imaging of the abdomen and pelvis was performed
following the standard protocol without IV contrast.

[Series 3: ap without · axial · non-contrast · 0.64mm/px · z∈[+972,+1422]mm · 13 of 102 slices shown, 15 images]
[im 6/102  soft-tissue]
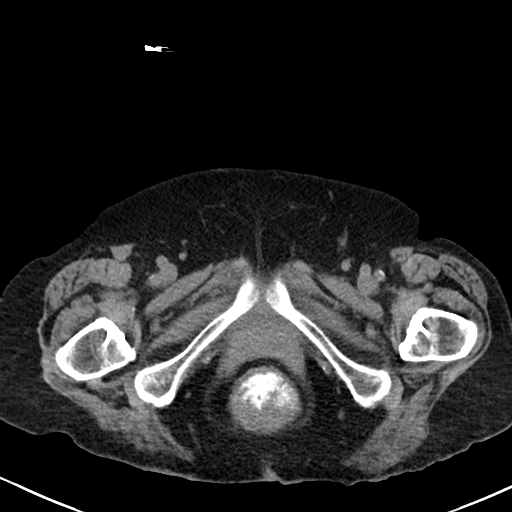
[im 6/102  bone]
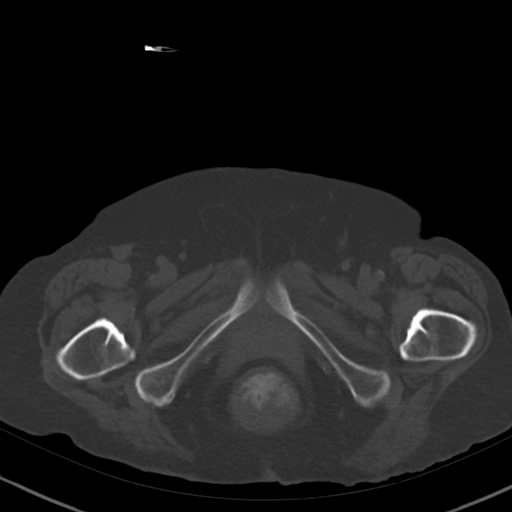
[im 16/102  soft-tissue]
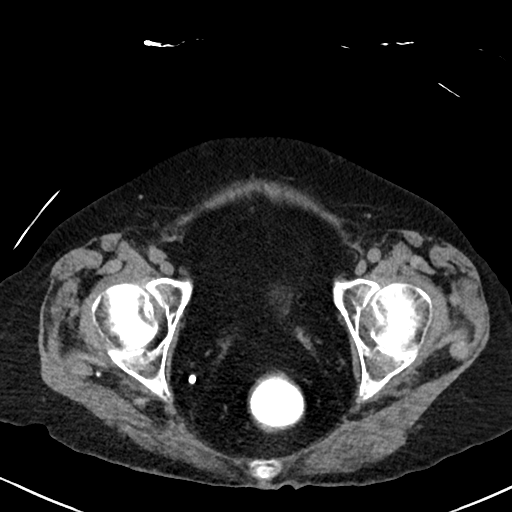
[im 22/102  soft-tissue]
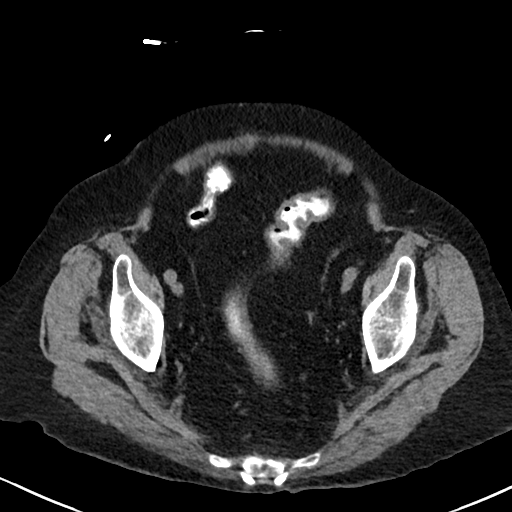
[im 27/102  soft-tissue]
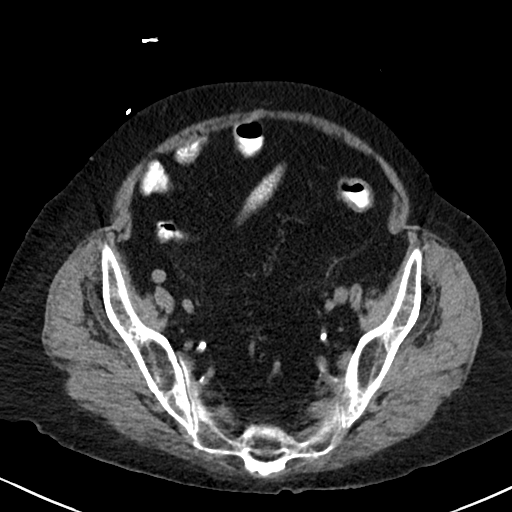
[im 38/102  soft-tissue]
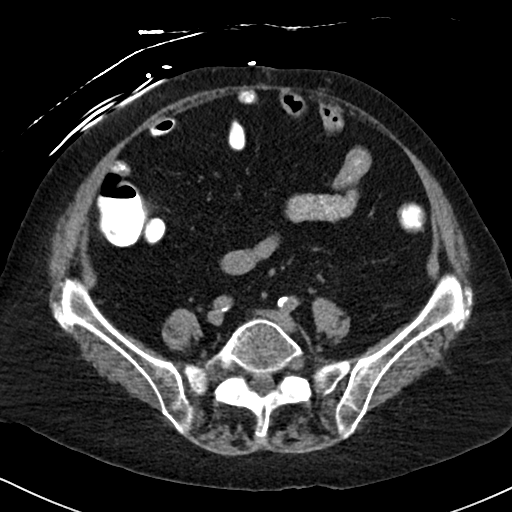
[im 43/102  soft-tissue]
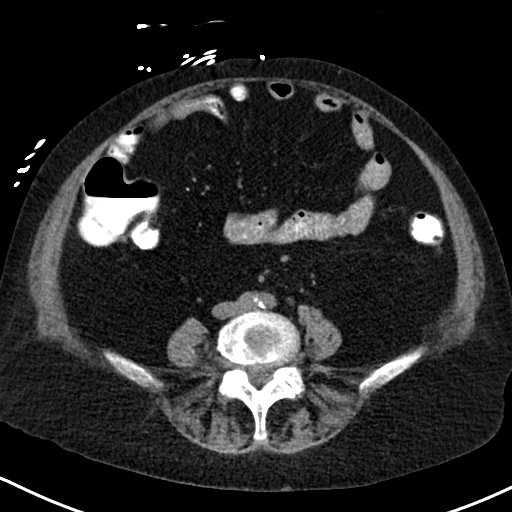
[im 54/102  soft-tissue]
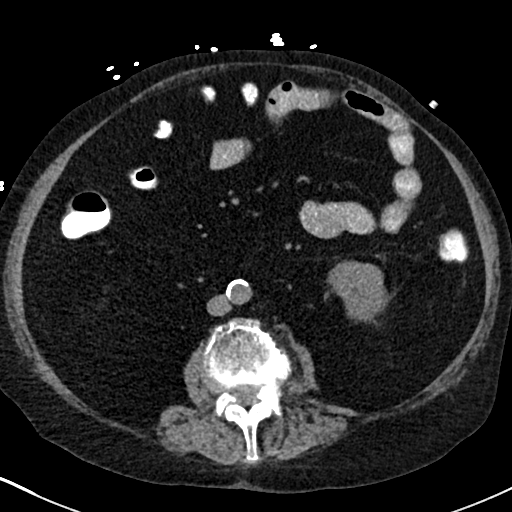
[im 59/102  soft-tissue]
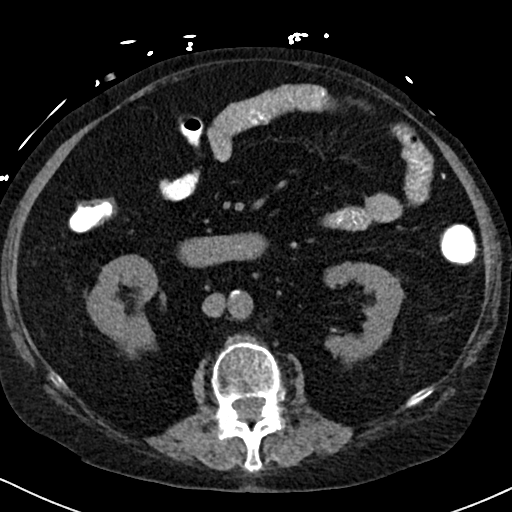
[im 64/102  soft-tissue]
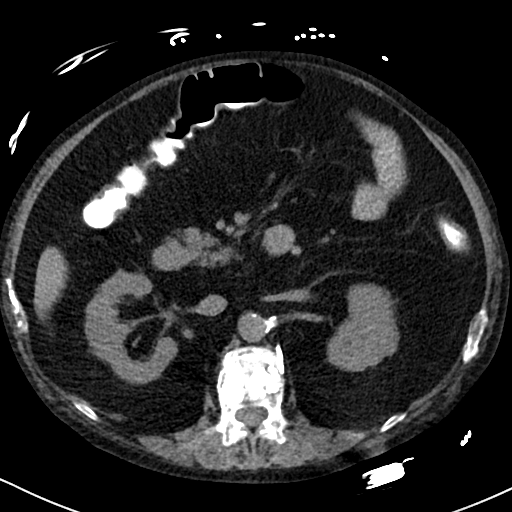
[im 64/102  bone]
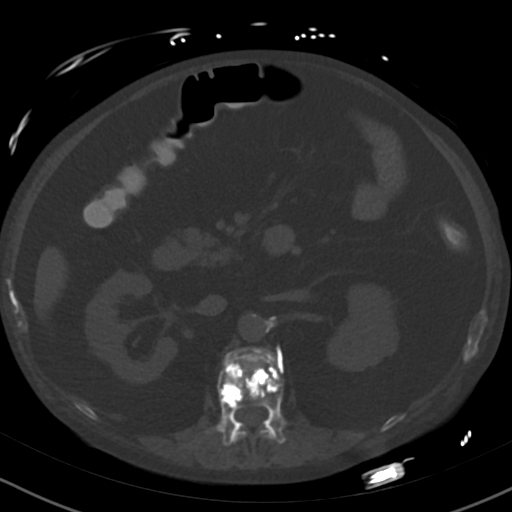
[im 75/102  soft-tissue]
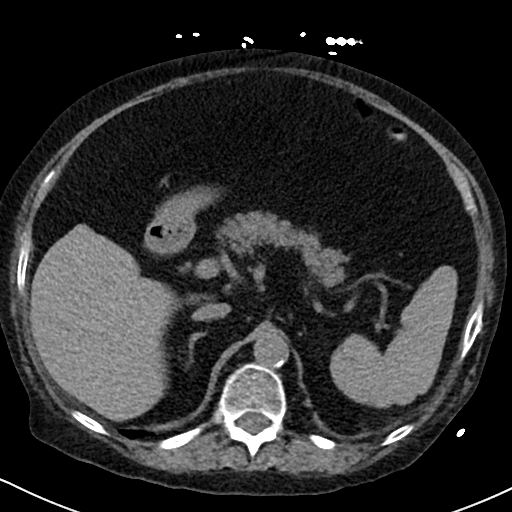
[im 80/102  soft-tissue]
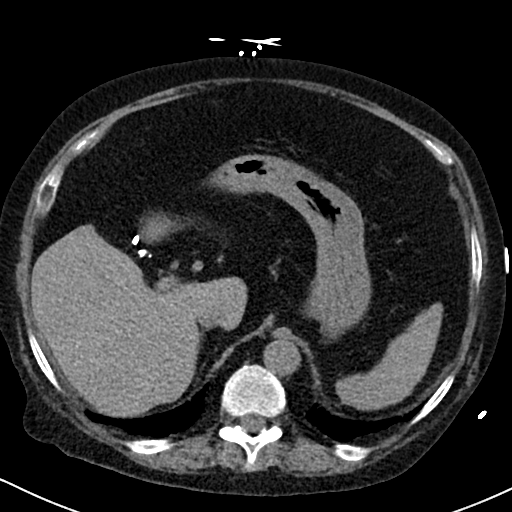
[im 86/102  soft-tissue]
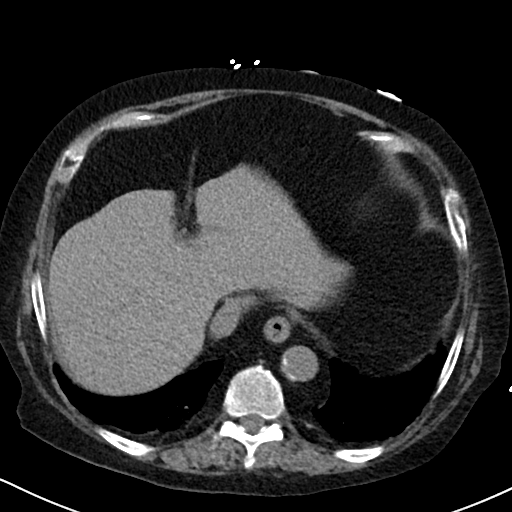
[im 96/102  soft-tissue]
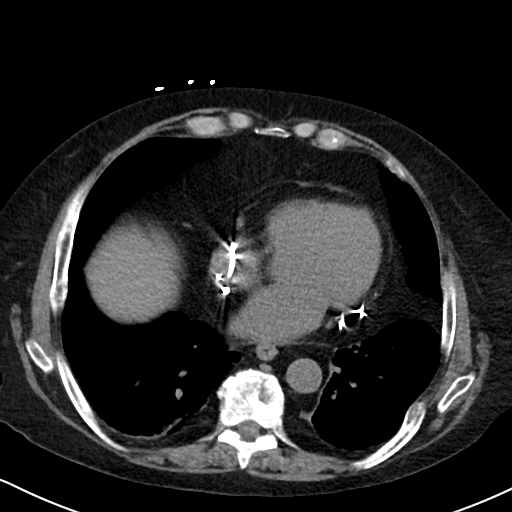

[Series 6: cor · coronal · 0.77mm/px · 3 of 101 slices shown]
[im 34/101  soft-tissue]
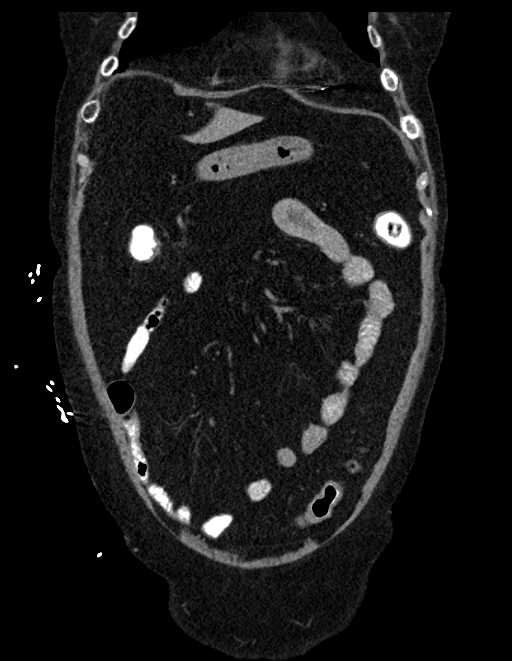
[im 45/101  soft-tissue]
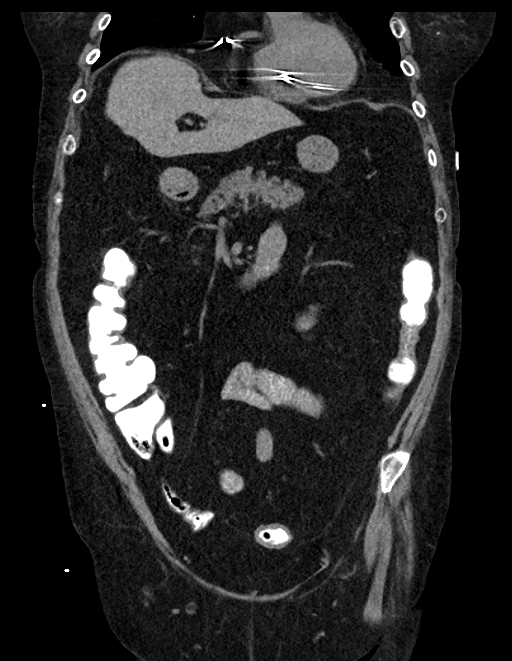
[im 56/101  soft-tissue]
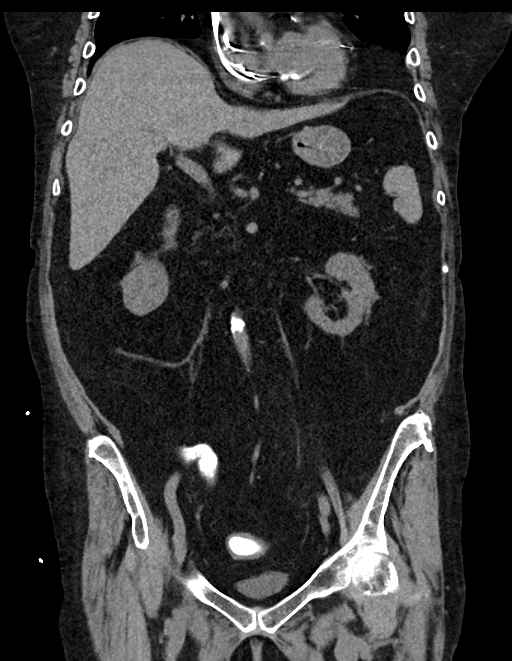

[16 of 46 positions shown; findings below may reference images not displayed]

FINDINGS: Lower chest: Dependent atelectasis and scarring in the lung bases.
Fat in the fissures.

Hepatobiliary: No focal liver abnormality is seen. Status post
cholecystectomy. No biliary dilatation.

Pancreas: Unremarkable. No pancreatic ductal dilatation or
surrounding inflammatory changes.

Spleen: Normal in size without focal abnormality.

Adrenals/Urinary Tract: Adrenal glands are unremarkable. Kidneys are
normal, without renal calculi, focal lesion, or hydronephrosis.
Bladder is unremarkable.

Stomach/Bowel: Stomach, small bowel, and colon are mostly
decompressed. Contrast material flows through to the rectum
suggesting no evidence of obstruction. There is mild but diffuse
wall thickening demonstrated in the colon predominantly involving
the descending and rectosigmoid colon. This suggest colitis and may
indicate an infectious colitis or inflammatory bowel disease such as
ulcerative colitis. Appendix is not identified.

Vascular/Lymphatic: Aortic atherosclerosis. No enlarged abdominal or
pelvic lymph nodes.

Reproductive: Status post hysterectomy. No adnexal masses.

Other: No free air or free fluid in the abdomen. Abdominal wall
musculature appears intact. Prominent visceral adipose tissue.

Musculoskeletal: Compression deformities of T6, T7, and L1 vertebra
with kyphoplasty changes at L1. Schmorl's node at the inferior
endplate of L4. Degenerative changes in the spine with degenerative
disc disease at L2-3 and L3-4 levels.
IMPRESSION: Mild diffuse wall thickening in the colon predominantly involving
the descending and rectosigmoid colon suggesting colitis and may
indicate infectious colitis or inflammatory bowel disease. No
evidence of bowel obstruction. Chronic appearing vertebral
compression deformities. Prominent visceral adipose tissues.

## 2019-01-14 ENCOUNTER — Telehealth: Payer: Self-pay

## 2019-01-14 ENCOUNTER — Telehealth: Payer: Self-pay | Admitting: Internal Medicine

## 2019-01-14 NOTE — Telephone Encounter (Signed)
Melissa, pts daughter called stating she would need to come in with her mother for her lab appt d/t her mom having impaired memory.

## 2019-01-14 NOTE — Telephone Encounter (Signed)
Patient has lab appt tomorrow. Daughter, [name], states she needs to come with her mother due to a stroke she had leaving her with cognitive difficulties.

## 2019-01-15 ENCOUNTER — Other Ambulatory Visit (HOSPITAL_COMMUNITY)
Admission: RE | Admit: 2019-01-15 | Discharge: 2019-01-15 | Disposition: A | Discharge status: Home / Self Care | Payer: Medicare HMO | Source: Ambulatory Visit | Attending: Internal Medicine | Admitting: Internal Medicine

## 2019-01-15 ENCOUNTER — Other Ambulatory Visit: Payer: Self-pay

## 2019-01-15 ENCOUNTER — Other Ambulatory Visit: Payer: Medicare HMO

## 2019-01-15 DIAGNOSIS — Z01812 Encounter for preprocedural laboratory examination: Secondary | ICD-10-CM | POA: Diagnosis present

## 2019-01-15 DIAGNOSIS — Z20828 Contact with and (suspected) exposure to other viral communicable diseases: Secondary | ICD-10-CM | POA: Diagnosis not present

## 2019-01-15 DIAGNOSIS — I480 Paroxysmal atrial fibrillation: Secondary | ICD-10-CM

## 2019-01-15 DIAGNOSIS — Z950 Presence of cardiac pacemaker: Secondary | ICD-10-CM

## 2019-01-15 LAB — BASIC METABOLIC PANEL
BUN/Creatinine Ratio: 18 (ref 12–28)
BUN: 21 mg/dL (ref 8–27)
CO2: 22 mmol/L (ref 20–29)
Calcium: 9.1 mg/dL (ref 8.7–10.3)
Chloride: 116 mmol/L — ABNORMAL HIGH (ref 96–106)
Creatinine, Ser: 1.14 mg/dL — ABNORMAL HIGH (ref 0.57–1.00)
GFR calc Af Amer: 55 mL/min/{1.73_m2} — ABNORMAL LOW (ref >59)
GFR calc non Af Amer: 48 mL/min/{1.73_m2} — ABNORMAL LOW (ref >59)
Glucose: 125 mg/dL — ABNORMAL HIGH (ref 65–99)
Potassium: 4.6 mmol/L (ref 3.5–5.2)
Sodium: 149 mmol/L — ABNORMAL HIGH (ref 134–144)

## 2019-01-15 LAB — CBC
Hematocrit: 39.6 % (ref 34.0–46.6)
Hemoglobin: 13.2 g/dL (ref 11.1–15.9)
MCH: 31.5 pg (ref 26.6–33.0)
MCHC: 33.3 g/dL (ref 31.5–35.7)
MCV: 95 fL (ref 79–97)
Platelets: 249 10*3/uL (ref 150–450)
RBC: 4.19 x10E6/uL (ref 3.77–5.28)
RDW: 14.2 % (ref 11.7–15.4)
WBC: 9.2 10*3/uL (ref 3.4–10.8)

## 2019-01-17 LAB — NOVEL CORONAVIRUS, NAA (HOSP ORDER, SEND-OUT TO REF LAB; TAT 18-24 HRS): SARS-CoV-2, NAA: NOT DETECTED

## 2019-01-18 ENCOUNTER — Ambulatory Visit (HOSPITAL_COMMUNITY)
Admission: RE | Admit: 2019-01-18 | Discharge: 2019-01-18 | Disposition: A | Discharge status: Home / Self Care | Payer: Medicare HMO | Attending: Internal Medicine | Admitting: Internal Medicine

## 2019-01-18 ENCOUNTER — Ambulatory Visit (HOSPITAL_COMMUNITY)
Admission: RE | Disposition: A | Discharge status: Home / Self Care | Payer: Self-pay | Source: Home / Self Care | Attending: Internal Medicine

## 2019-01-18 ENCOUNTER — Other Ambulatory Visit: Payer: Self-pay

## 2019-01-18 ENCOUNTER — Encounter (HOSPITAL_COMMUNITY): Payer: Self-pay | Admitting: Internal Medicine

## 2019-01-18 DIAGNOSIS — J45909 Unspecified asthma, uncomplicated: Secondary | ICD-10-CM | POA: Insufficient documentation

## 2019-01-18 DIAGNOSIS — Z7901 Long term (current) use of anticoagulants: Secondary | ICD-10-CM | POA: Insufficient documentation

## 2019-01-18 DIAGNOSIS — E7800 Pure hypercholesterolemia, unspecified: Secondary | ICD-10-CM | POA: Diagnosis not present

## 2019-01-18 DIAGNOSIS — M069 Rheumatoid arthritis, unspecified: Secondary | ICD-10-CM | POA: Diagnosis not present

## 2019-01-18 DIAGNOSIS — Z881 Allergy status to other antibiotic agents status: Secondary | ICD-10-CM | POA: Diagnosis not present

## 2019-01-18 DIAGNOSIS — I4891 Unspecified atrial fibrillation: Secondary | ICD-10-CM | POA: Diagnosis not present

## 2019-01-18 DIAGNOSIS — Z888 Allergy status to other drugs, medicaments and biological substances status: Secondary | ICD-10-CM | POA: Diagnosis not present

## 2019-01-18 DIAGNOSIS — Z885 Allergy status to narcotic agent status: Secondary | ICD-10-CM | POA: Diagnosis not present

## 2019-01-18 DIAGNOSIS — M797 Fibromyalgia: Secondary | ICD-10-CM | POA: Diagnosis not present

## 2019-01-18 DIAGNOSIS — I428 Other cardiomyopathies: Secondary | ICD-10-CM | POA: Diagnosis not present

## 2019-01-18 DIAGNOSIS — Z886 Allergy status to analgesic agent status: Secondary | ICD-10-CM | POA: Insufficient documentation

## 2019-01-18 DIAGNOSIS — Z880 Allergy status to penicillin: Secondary | ICD-10-CM | POA: Insufficient documentation

## 2019-01-18 DIAGNOSIS — I10 Essential (primary) hypertension: Secondary | ICD-10-CM | POA: Insufficient documentation

## 2019-01-18 DIAGNOSIS — Z8673 Personal history of transient ischemic attack (TIA), and cerebral infarction without residual deficits: Secondary | ICD-10-CM | POA: Insufficient documentation

## 2019-01-18 DIAGNOSIS — I252 Old myocardial infarction: Secondary | ICD-10-CM | POA: Diagnosis not present

## 2019-01-18 DIAGNOSIS — Z7984 Long term (current) use of oral hypoglycemic drugs: Secondary | ICD-10-CM | POA: Diagnosis not present

## 2019-01-18 DIAGNOSIS — Z4502 Encounter for adjustment and management of automatic implantable cardiac defibrillator: Secondary | ICD-10-CM | POA: Insufficient documentation

## 2019-01-18 DIAGNOSIS — E119 Type 2 diabetes mellitus without complications: Secondary | ICD-10-CM | POA: Insufficient documentation

## 2019-01-18 DIAGNOSIS — Z8249 Family history of ischemic heart disease and other diseases of the circulatory system: Secondary | ICD-10-CM | POA: Insufficient documentation

## 2019-01-18 DIAGNOSIS — Z79899 Other long term (current) drug therapy: Secondary | ICD-10-CM | POA: Insufficient documentation

## 2019-01-18 DIAGNOSIS — Z882 Allergy status to sulfonamides status: Secondary | ICD-10-CM | POA: Insufficient documentation

## 2019-01-18 DIAGNOSIS — I442 Atrioventricular block, complete: Secondary | ICD-10-CM | POA: Insufficient documentation

## 2019-01-18 HISTORY — PX: PPM GENERATOR CHANGEOUT: EP1233

## 2019-01-18 LAB — SURGICAL PCR SCREEN
MRSA, PCR: NEGATIVE
Staphylococcus aureus: POSITIVE — AB

## 2019-01-18 LAB — GLUCOSE, CAPILLARY
Glucose-Capillary: 95 mg/dL (ref 70–99)
Glucose-Capillary: 119 mg/dL — ABNORMAL HIGH (ref 70–99)

## 2019-01-18 SURGERY — PPM GENERATOR CHANGEOUT

## 2019-01-18 MED ORDER — LIDOCAINE HCL (PF) 1 % IJ SOLN
INTRAMUSCULAR | Status: AC
  Filled 2019-01-18: qty 60

## 2019-01-18 MED ORDER — MUPIROCIN 2 % EX OINT
1.0000 "application " | TOPICAL_OINTMENT | Freq: Once | CUTANEOUS | Status: AC
  Administered 2019-01-18: 09:00:00 1 via TOPICAL

## 2019-01-18 MED ORDER — DIPHENHYDRAMINE HCL 50 MG/ML IJ SOLN
INTRAMUSCULAR | Status: DC | PRN
  Administered 2019-01-18: 25 mg via INTRAVENOUS

## 2019-01-18 MED ORDER — ONDANSETRON HCL 4 MG/2ML IJ SOLN: 4.0000 mg | Freq: Four times a day (QID) | INTRAMUSCULAR | Status: DC | PRN

## 2019-01-18 MED ORDER — SODIUM CHLORIDE 0.9 % IV SOLN
INTRAVENOUS | Status: DC
  Administered 2019-01-18: 09:00:00 via INTRAVENOUS

## 2019-01-18 MED ORDER — CHLORHEXIDINE GLUCONATE 4 % EX LIQD: 4.0000 "application " | Freq: Once | CUTANEOUS | Status: DC

## 2019-01-18 MED ORDER — VANCOMYCIN HCL IN DEXTROSE 1-5 GM/200ML-% IV SOLN
INTRAVENOUS | Status: AC
  Filled 2019-01-18: qty 200

## 2019-01-18 MED ORDER — SODIUM CHLORIDE 0.9 % IV SOLN
80.0000 mg | INTRAVENOUS | Status: AC
  Administered 2019-01-18: 80 mg

## 2019-01-18 MED ORDER — DIPHENHYDRAMINE HCL 50 MG/ML IJ SOLN
INTRAMUSCULAR | Status: AC
  Filled 2019-01-18: qty 1

## 2019-01-18 MED ORDER — LIDOCAINE HCL (PF) 1 % IJ SOLN
INTRAMUSCULAR | Status: AC
  Filled 2019-01-18: qty 30

## 2019-01-18 MED ORDER — VANCOMYCIN HCL IN DEXTROSE 1-5 GM/200ML-% IV SOLN
1000.0000 mg | INTRAVENOUS | Status: AC
  Administered 2019-01-18: 1000 mg via INTRAVENOUS

## 2019-01-18 MED ORDER — MUPIROCIN 2 % EX OINT
TOPICAL_OINTMENT | CUTANEOUS | Status: AC
  Administered 2019-01-18: 1 via TOPICAL
  Filled 2019-01-18: qty 22

## 2019-01-18 MED ORDER — SODIUM CHLORIDE 0.9 % IV SOLN
INTRAVENOUS | Status: AC
  Filled 2019-01-18: qty 2

## 2019-01-18 MED ORDER — LIDOCAINE HCL (PF) 1 % IJ SOLN
INTRAMUSCULAR | Status: DC | PRN
  Administered 2019-01-18: 80 mL

## 2019-01-18 MED ORDER — ACETAMINOPHEN 325 MG PO TABS
325.0000 mg | ORAL_TABLET | ORAL | Status: DC | PRN
  Filled 2019-01-18: qty 2

## 2019-01-18 SURGICAL SUPPLY — 8 items
CABLE SURGICAL S-101-97-12 (CABLE) ×3 IMPLANT
HEMOSTAT SURGICEL 2X4 FIBR (HEMOSTASIS) ×2 IMPLANT
PACEMAKER ALLR CRT-P RF PM3222 (Pacemaker) IMPLANT
PAD PRO RADIOLUCENT 2001M-C (PAD) ×3 IMPLANT
POUCH AIGIS-R ANTIBACT ICD (Mesh General) ×3 IMPLANT
POUCH AIGIS-R ANTIBACT ICD LRG (Mesh General) IMPLANT
PPM ALLURE CRT-P RF PM3222 (Pacemaker) ×3 IMPLANT
TRAY PACEMAKER INSERTION (PACKS) ×3 IMPLANT

## 2019-01-18 NOTE — H&P (Signed)
Patient Care Team: Bonnita Nasuti, MD as PCP - General (Internal Medicine) Fay Records, MD as PCP - Cardiology (Cardiology)   HPI  Kristin Gomez is a 73 y.o. female  Admitted for pacemaker generator replaceent   She has CRT upgrade to which was assoc with normalization of LV function  This is procedure at least number 3  Denies chest pain sob or fevers  Just does not feel good but says she is nervous and that this is a common reaction to being stressed and scared   DATE TEST EF   5/19 Echo   60-65 %          Thromboembolic risk factors ( age -42, HTN-1, TIA/CVA-2, DM-1, CHF-1, Gender-1) for a CHADSVASc Score of =>7 she is anticoagulated with apixaban.    Past Medical History:  Diagnosis Date  . Arthritis    "all over" (09/26/2017)  . Asthma   . Diabetes mellitus   . Fibromyalgia   . Heart disease   . High cholesterol   . History of blood transfusion    "related to a surgery" (09/26/2017)  . Hypertension   . Memory difficulty 07/26/2017  . Myocardial infarction [facility])    "years ago" (09/26/2017)  . Pacemaker   . 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 Stroke [facility]) 02/2017   "affected my memory" (09/26/2017)  . Type II diabetes mellitus ([place])     Past Surgical History:  Procedure Laterality Date  . CARDIAC CATHETERIZATION    . CATARACT EXTRACTION W/ INTRAOCULAR LENS  IMPLANT, BILATERAL Bilateral   . CHOLECYSTECTOMY    . COLONOSCOPY    . I&D EXTREMITY  03/31/2011   Procedure: IRRIGATION AND DEBRIDEMENT EXTREMITY;  Surgeon: Tennis Must, MD;  Location: [place];  Service: Orthopedics;  Laterality: Left;  left long finger  . INSERT / REPLACE / REMOVE PACEMAKER    . KNEE ARTHROSCOPY Left   . MASS EXCISION  03/08/2011   Procedure: EXCISION MASS;  Surgeon: Tennis Must, MD;  Location: [place];  Service: Orthopedics;  Laterality: Left;  Excision Mass Left Long Finger and Debridement of Distal Interphalangeal  Joint   . TONSILLECTOMY    . TOTAL ABDOMINAL HYSTERECTOMY    . TUBAL LIGATION      Current Facility-Administered Medications  Medication Dose Route Frequency Provider Last Rate Last Dose  . 0.9 %  sodium chloride infusion   Intravenous Continuous Deboraha Sprang, MD      . chlorhexidine (HIBICLENS) 4 % liquid 4 application  4 application Topical Once Deboraha Sprang, MD      . gentamicin (GARAMYCIN) 80 mg in sodium chloride 0.9 % 500 mL irrigation  80 mg Irrigation On Call Deboraha Sprang, MD      . mupirocin ointment (BACTROBAN) 2 % 1 application  1 application Topical Once Deboraha Sprang, MD      . mupirocin ointment (BACTROBAN) 2 %           . vancomycin (VANCOCIN) IVPB 1000 mg/200 mL premix  1,000 mg Intravenous On Call Deboraha Sprang, MD        Allergies  Allergen Reactions  . Aspirin Shortness Of Breath and Other (See Comments)    Patient states that this caused an asthma attack Patient states that is causes an asthma attack.  . Azithromycin Anaphylaxis  . Infliximab Hives    remicade  . Methotrexate Hives  . Mushroom Extract  Complex Anaphylaxis, Shortness Of Breath and Swelling  . Nsaids Shortness Of Breath and Other (See Comments)    Asthma attack  . Aricept [Donepezil Hcl]     Diarrhea  . Avelox [Moxifloxacin Hcl In Nacl] Swelling    Site of swelling not recalled  . Diovan [Valsartan] Swelling    Site of swelling not recalled  . Amoxicillin Rash  . Atorvastatin Swelling and Other (See Comments)    Exacerbated Psoriatic arthritis   . Contrast Media [Iodinated Diagnostic Agents] Rash  . Latex Rash  . Metformin Rash  . Metformin And Related Rash    ??  . Penicillins Rash    Has patient had a PCN reaction causing immediate rash, facial/tongue/throat swelling, SOB or lightheadedness with hypotension: Yes Has patient had a PCN reaction causing severe rash involving mucus membranes or skin necrosis: No Has patient had a PCN reaction that required hospitalization: No Has  patient had a PCN reaction occurring within the last 10 years: No If all of the above answers are "NO", then may proceed with Cephalosporin use.   Marland Kitchen Percocet [Oxycodone-Acetaminophen] Nausea Only  . Prednisone Rash    Can only be tolerated with Benadryl  . Propofol Rash  . Sulfa Antibiotics Nausea Only and Rash  . Vicodin [Hydrocodone-Acetaminophen] Nausea Only      Social History   Tobacco Use  . Smoking status: Never Smoker  . Smokeless tobacco: Never Used  Substance Use Topics  . Alcohol use: Never    Frequency: Never  . Drug use: Never     Family History  Problem Relation Age of Onset  . Alzheimer's disease Mother   . Heart attack Father   . Diabetes Brother      Current Meds  Medication Sig  . albuterol (PROVENTIL HFA;VENTOLIN HFA) 108 (90 Base) MCG/ACT inhaler Inhale 2 puffs into the lungs every 6 (six) hours as needed for wheezing or shortness of breath.   Marland Kitchen apixaban (ELIQUIS) 5 MG TABS tablet Take 1 tablet (5 mg total) by mouth 2 (two) times daily.  . carvedilol (COREG) 12.5 MG tablet Take 1 tablet (12.5 mg total) by mouth 2 (two) times daily.  . Cholecalciferol (VITAMIN D3) 50 MCG (2000 UT) capsule Take 2,000 Units by mouth daily.   . Cyanocobalamin (B-12 COMPLIANCE INJECTION) 1000 MCG/ML KIT Inject 1 mL into the muscle every 14 (fourteen) days.   . diphenhydramine-acetaminophen (TYLENOL PM) 25-500 MG TABS tablet Take 1-2 tablets by mouth at bedtime as needed (pain).  . furosemide (LASIX) 20 MG tablet Take 1 tablet (20 mg total) by mouth daily.  Marland Kitchen glipiZIDE (GLUCOTROL XL) 5 MG 24 hr tablet Take 5 mg by mouth at bedtime.  Marland Kitchen ipratropium-albuterol (DUONEB) 0.5-2.5 (3) MG/3ML SOLN Take 3 mLs by nebulization every 6 (six) hours as needed (for shortness of breath or wheezing).   Marland Kitchen levothyroxine (EUTHYROX) 25 MCG tablet Take 25 mcg by mouth daily before breakfast.  . memantine (NAMENDA) 10 MG tablet Take 1 tablet (10 mg total) by mouth 2 (two) times daily.  .  methylPREDNISolone (MEDROL) 4 MG tablet Take 4 mg by mouth as needed (Fibermyalgia).   Marland Kitchen omeprazole (PRILOSEC) 20 MG capsule Take 20 mg by mouth daily as needed (acid reflux).   . potassium chloride SA (K-DUR,KLOR-CON) 20 MEQ tablet Take 20 mEq by mouth daily.  . rosuvastatin (CRESTOR) 20 MG tablet Take 1 tablet (20 mg total) by mouth daily.     Review of Systems negative except from HPI and PMH  Physical Exam BP 113/66   Pulse 84   Temp 98.2 F (36.8 C) (Oral)   Resp 18   Ht 5' 5"  (1.651 m)   Wt 63.5 kg   SpO2 95%   BMI 23.30 kg/m  Well developed and well nourished in no acute distress HENT normal E scleral and icterus clear Neck Supple JVP flat; carotids brisk and full Clear to ausculation Device pocket well healed; without hematoma or erythema.  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  These include but are not limited to lead fracture and infection.  The patient understands, agrees and is willing to proceed.    Will review with daughters as her memory is lacking

## 2019-01-18 NOTE — Progress Notes (Signed)
Mupirocin instructions reviewed with patient and daughter [name] for positive PCR

## 2019-01-18 NOTE — Discharge Instructions (Signed)

## 2019-01-21 ENCOUNTER — Telehealth: Payer: Self-pay | Admitting: Internal Medicine

## 2019-01-21 MED FILL — Vancomycin HCl-Dextrose IV Soln 1 GM/200ML-5%: INTRAVENOUS | Qty: 200 | Status: AC

## 2019-01-21 MED FILL — Gentamicin Sulfate Inj 40 MG/ML: INTRAMUSCULAR | Qty: 80 | Status: AC

## 2019-01-21 NOTE — Telephone Encounter (Signed)
Patient has cognitive issues related 2 multiple stroke and 1 family member may accompany patient to all appointments.

## 2019-01-21 NOTE — Telephone Encounter (Signed)
New Message  Patient's daughter Kristin Gomez) states that patient has cognitive issues from strokes that have happened in the past and needs someone to accompany her to appointments. Patient's daughter is asking to have it in the system where she is always approved to come in with patient due to cognitive issues. Please call and confirm.p cv

## 2019-01-29 ENCOUNTER — Ambulatory Visit: Payer: Medicare HMO

## 2019-01-31 ENCOUNTER — Ambulatory Visit (INDEPENDENT_AMBULATORY_CARE_PROVIDER_SITE_OTHER): Payer: Medicare HMO | Admitting: *Deleted

## 2019-01-31 ENCOUNTER — Other Ambulatory Visit: Payer: Self-pay

## 2019-01-31 DIAGNOSIS — I480 Paroxysmal atrial fibrillation: Secondary | ICD-10-CM | POA: Diagnosis not present

## 2019-01-31 NOTE — Patient Instructions (Signed)
Call office if you have swelling, redness or drainage from incision site.

## 2019-02-02 ENCOUNTER — Telehealth: Payer: Self-pay | Admitting: Cardiology

## 2019-02-02 NOTE — Telephone Encounter (Signed)
Received call from the patient's daughter regarding her mother. The patient had a pacemaker battery exchange on 01/18/2019.  She has been doing well since that time but her daughter noted that a stitch was coming through her skin today.  Notes approximately 1 cm of material.  Otherwise, the site looks fine without drainage, erythema, swelling, tenderness, warmth, fever, chills.  There was a small amount of dried blood surrounding where the stitch came through the skin but no active bleeding.  The patient is not having any fevers, chills or systemic symptoms.  Given that this sounds like good healing of the pacemaker pocket without infection but with suture material extruding from the skin, I advised that the patient can be seen in our clinic early this week to have the residual material trimmed.  Advised that the patient should come in for further evaluation should the pocket develop any redness, swelling, pain, drainage and in the interim before the appointment to continue to keep the site clean and dry.  Bryna Colander, MD

## 2019-02-04 ENCOUNTER — Telehealth: Payer: Self-pay | Admitting: Emergency Medicine

## 2019-02-04 ENCOUNTER — Other Ambulatory Visit: Payer: Self-pay

## 2019-02-04 ENCOUNTER — Ambulatory Visit (INDEPENDENT_AMBULATORY_CARE_PROVIDER_SITE_OTHER): Payer: Medicare HMO | Admitting: *Deleted

## 2019-02-04 DIAGNOSIS — I428 Other cardiomyopathies: Secondary | ICD-10-CM

## 2019-02-04 NOTE — Progress Notes (Signed)
Suture visible at medial aspect of suture line. Suture clipped and triple antibiotic ointment placed on area. Education done on on signs and symptoms of infection with patient and family. Will contact clinic if there are any questions or concerns/

## 2019-02-04 NOTE — Telephone Encounter (Signed)
LMOM for Jennifer(daughter) . Need to schedule appointment today for wound check due to possible suture at wound site.

## 2019-02-04 NOTE — Telephone Encounter (Signed)
Scheduled for 1500 appointment today to evaluate incision for possible suture. Daughter to accompany patient to appointment.

## 2019-02-05 LAB — CUP PACEART INCLINIC DEVICE CHECK
Battery Remaining Longevity: 49 mo
Battery Voltage: 3.01 V
Brady Statistic RA Percent Paced: 11 %
Brady Statistic RV Percent Paced: 99.77 %
Date Time Interrogation Session: 20201217153200
Implantable Lead Implant Date: 20081110
Implantable Lead Implant Date: 20081110
Implantable Lead Implant Date: 20081110
Implantable Lead Location: 753858
Implantable Lead Location: 753859
Implantable Lead Location: 753860
Implantable Pulse Generator Implant Date: 20201204
Lead Channel Impedance Value: 462.5 Ohm
Lead Channel Impedance Value: 475 Ohm
Lead Channel Impedance Value: 825 Ohm
Lead Channel Pacing Threshold Amplitude: 0.75 V
Lead Channel Pacing Threshold Amplitude: 1.125 V
Lead Channel Pacing Threshold Amplitude: 1.75 V
Lead Channel Pacing Threshold Pulse Width: 0.4 ms
Lead Channel Pacing Threshold Pulse Width: 0.4 ms
Lead Channel Pacing Threshold Pulse Width: 1.5 ms
Lead Channel Sensing Intrinsic Amplitude: 3.4 mV
Lead Channel Setting Pacing Amplitude: 1.75 V
Lead Channel Setting Pacing Amplitude: 2.125
Lead Channel Setting Pacing Amplitude: 3 V
Lead Channel Setting Pacing Pulse Width: 0.4 ms
Lead Channel Setting Pacing Pulse Width: 1.5 ms
Lead Channel Setting Sensing Sensitivity: 4 mV
Pulse Gen Model: 3222
Pulse Gen Serial Number: 9132853

## 2019-02-05 NOTE — Progress Notes (Signed)
Wound check appointment. Dermabond removed. Wound without redness or edema. Incision edges approximated, wound well healed. Normal device function. Thresholds, sensing, and impedances consistent with implant measurements. Device programmed at chronic settings due to generator change only. Histogram distribution appropriate for patient and level of activity. AT/AF burden < 1%, 8 AMS episodes that appear to be AF with controlled v-rates with the longest lasting 2 minutes and 32 seconds. No high ventricular rates noted. Patient remote scheduled for 04/23/19. Follow up with Dr Caryl Comes 04/22/19.

## 2019-02-18 ENCOUNTER — Telehealth: Payer: Self-pay | Admitting: Internal Medicine

## 2019-02-18 ENCOUNTER — Other Ambulatory Visit: Payer: Self-pay | Admitting: Internal Medicine

## 2019-02-18 NOTE — Telephone Encounter (Signed)
  1. Has your device fired?   2. Is you device beeping?   3. Are you experiencing draining or swelling at device site? No. Another stitch is coming out at the incision site. She had her pace maker replaced on 12/4  4. Are you calling to see if we received your device transmission?   5. Have you passed out?     Please route to [place]

## 2019-02-19 NOTE — Telephone Encounter (Signed)
Pt last saw Kristin Gomez, [place] on 12/14/18, last labs 01/15/19 Creat 1.14, age 74, weight 63.5kg, based on specified criteria pt is on appropriate dosage of Eliquis 5mg  BID.  Will refill rx.

## 2019-02-21 ENCOUNTER — Encounter: Payer: Self-pay | Admitting: Emergency Medicine

## 2019-02-21 NOTE — Telephone Encounter (Signed)
Opened in error

## 2019-02-21 NOTE — Telephone Encounter (Signed)
Patient has stitch that has presented at the incision site. No redness, drainage or edema. Will come in 02/26/19 to be evaluated.

## 2019-02-21 NOTE — Telephone Encounter (Signed)
Pt daughter states another stitch is coming out of her device site. I let her talk with our device nurse Jenny Reichmann, Rn.

## 2019-02-25 ENCOUNTER — Telehealth: Payer: Self-pay | Admitting: Emergency Medicine

## 2019-02-25 NOTE — Telephone Encounter (Signed)
OK to move patient's appointment to 3:30 pm on 02/26/19.

## 2019-02-25 NOTE — Telephone Encounter (Signed)
LMOM with daughter [name] and Melisa per DPR to see if patient could come for appointment @ 3:30 pm on 02/26/19 instead of 2:30 pm.

## 2019-02-26 ENCOUNTER — Other Ambulatory Visit: Payer: Self-pay

## 2019-02-26 ENCOUNTER — Ambulatory Visit (INDEPENDENT_AMBULATORY_CARE_PROVIDER_SITE_OTHER): Payer: Medicare HMO | Admitting: *Deleted

## 2019-02-26 DIAGNOSIS — I5022 Chronic systolic (congestive) heart failure: Secondary | ICD-10-CM

## 2019-02-26 DIAGNOSIS — I428 Other cardiomyopathies: Secondary | ICD-10-CM

## 2019-02-26 NOTE — Progress Notes (Signed)
Patient presents for wound check with report of stitch present. Band-aid removed and stitch visualized  at right outer edge of incision site.Suture clipped.  Small amount of blood at area of stitch. Dr Caryl Comes in to see patient to evaluate wound. Bacitracin and band-aid applied and patient scheduled for wound check in 2 weeks per Dr Olin Pia instructions. Patient and family instructed to contact device clinic if she has any signs and symptoms of infection.

## 2019-02-26 NOTE — Patient Instructions (Signed)
Call the device clinic increased redness, drainage , or swelling at wound site.

## 2019-03-06 NOTE — Progress Notes (Signed)
PATIENT: Kristin Gomez DOB: 1945/09/28  REASON FOR VISIT: follow up HISTORY FROM: patient  HISTORY OF PRESENT ILLNESS: Today 03/07/19  Kristin Gomez is a 74 year old female with history of memory disorder.  She remains on Namenda, was unable to tolerate Aricept.  Following a syncopal episode, CT scan in May 2020 showed a left cerebellar infarct, right occipital infarct, likely chronic but new since 2019.  She has no deficits.  She is unable to have MRI of the brain because she has a pacemaker.  She is taking Eliquis.  Aspirin was suggested, as result of her CT angio head and neck showing severe bilateral proximal PCA stenosis, but she has significant allergy. Since last seen, she says she has been doing well.  She has good and bad days with her memory.  She lives with her daughter, Kristin Gomez.  She is able to do all of her own ADLs, but her daughter manages her medications.  She does laundry and housework, she does not drive a car.  Her balance is fairly stable, a few months ago, she did fall in the kitchen.  In December she had the battery in her pacemaker changed.  She has routine follow-up with her primary doctor tomorrow, she had shingles over the summer.  She presents today for evaluation accompanied by her daughter.  HISTORY 08/29/2018 SS: Kristin Gomez is a 74 year old female with history of memory disorder.  Her last memory score was 22/30.  She remains on Namenda 10 mg twice daily.  She lives with her daughter.  Her daughter manages her medications.  She is able to perform her own ADLs.  She reports a good appetite.  She is not currently driving a car, her license expired.  In the past she was unable to tolerate Aricept due to diarrhea.  She remains on Namenda.  Her daughter manages her finances.  She reports she sleeps well at night.  They deny any agitation or behavioral problems.  Apparently she was in the hospital at [facility] Jul 04, 2018 after a syncopal episode resulting in a fall.  She  had CT scan of the brain that showed a left cerebellar infarct, right occipital infarct, likely chronic, although new since 2019.  After that hospitalization her gait was affected, she was exhibiting a shuffling gait.  She is nearly finished with physical therapy that has helped to improve her ambulation.  She is unable to have MRI of the brain because she has a pacemaker, is taking Eliquis.  She was on cholesterol medication, because in the past it worsened her fibromyalgia.  She had follow-up with cardiology 08/03/2018, her carvedilol was increased.  Her echocardiogram showed mildly impaired left ventricular function, EF 45 to 50%.  They report a low hemoglobin at the hospital, but this was apparently rechecked and was found to be improved.  She does report close follow-up with her primary care provider.    REVIEW OF SYSTEMS: Out of a complete 14 system review of symptoms, the patient complains only of the following symptoms, and all other reviewed systems are negative.  Memory loss  ALLERGIES: Allergies  Allergen Reactions  . Aspirin Shortness Of Breath and Other (See Comments)    Patient states that this caused an asthma attack Patient states that is causes an asthma attack.  . Azithromycin Anaphylaxis  . Infliximab Hives    remicade  . Methotrexate Hives  . Mushroom Extract Complex Anaphylaxis, Shortness Of Breath and Swelling  . Nsaids Shortness Of Breath and  Other (See Comments)    Asthma attack  . Aricept [Donepezil Hcl]     Diarrhea  . Avelox [Moxifloxacin Hcl In Nacl] Swelling    Site of swelling not recalled  . Diovan [Valsartan] Swelling    Site of swelling not recalled  . Amoxicillin Rash  . Atorvastatin Swelling and Other (See Comments)    Exacerbated Psoriatic arthritis   . Contrast Media [Iodinated Diagnostic Agents] Rash  . Latex Rash  . Metformin Rash  . Metformin And Related Rash    ??  . Penicillins Rash    Has patient had a PCN reaction causing immediate rash,  facial/tongue/throat swelling, SOB or lightheadedness with hypotension: Yes Has patient had a PCN reaction causing severe rash involving mucus membranes or skin necrosis: No Has patient had a PCN reaction that required hospitalization: No Has patient had a PCN reaction occurring within the last 10 years: No If all of the above answers are "NO", then may proceed with Cephalosporin use.   Marland Kitchen Percocet [Oxycodone-Acetaminophen] Nausea Only  . Prednisone Rash    Can only be tolerated with Benadryl  . Propofol Rash  . Sulfa Antibiotics Nausea Only and Rash  . Vicodin [Hydrocodone-Acetaminophen] Nausea Only    HOME MEDICATIONS: Outpatient Medications Prior to Visit  Medication Sig Dispense Refill  . albuterol (PROVENTIL HFA;VENTOLIN HFA) 108 (90 Base) MCG/ACT inhaler Inhale 2 puffs into the lungs every 6 (six) hours as needed for wheezing or shortness of breath.     . carvedilol (COREG) 12.5 MG tablet Take 1 tablet (12.5 mg total) by mouth 2 (two) times daily. 180 tablet 3  . Cholecalciferol (VITAMIN D3) 50 MCG (2000 UT) capsule Take 2,000 Units by mouth daily.     . Cyanocobalamin (B-12 COMPLIANCE INJECTION) 1000 MCG/ML KIT Inject 1 mL into the muscle every 14 (fourteen) days.     . diphenhydramine-acetaminophen (TYLENOL PM) 25-500 MG TABS tablet Take 1-2 tablets by mouth at bedtime as needed (pain).    Marland Kitchen ELIQUIS 5 MG TABS tablet Take 1 tablet by mouth twice daily 60 tablet 5  . furosemide (LASIX) 20 MG tablet Take 1 tablet (20 mg total) by mouth daily. 90 tablet 3  . glipiZIDE (GLUCOTROL XL) 5 MG 24 hr tablet Take 5 mg by mouth at bedtime.    Marland Kitchen ipratropium-albuterol (DUONEB) 0.5-2.5 (3) MG/3ML SOLN Take 3 mLs by nebulization every 6 (six) hours as needed (for shortness of breath or wheezing).   2  . levothyroxine (EUTHYROX) 25 MCG tablet Take 25 mcg by mouth daily before breakfast.    . memantine (NAMENDA) 10 MG tablet Take 1 tablet (10 mg total) by mouth 2 (two) times daily. 60 tablet 6  .  methylPREDNISolone (MEDROL) 4 MG tablet Take 4 mg by mouth as needed (Fibermyalgia).     Marland Kitchen omeprazole (PRILOSEC) 20 MG capsule Take 20 mg by mouth daily as needed (acid reflux).     . potassium chloride SA (K-DUR,KLOR-CON) 20 MEQ tablet Take 20 mEq by mouth daily.    . rosuvastatin (CRESTOR) 20 MG tablet Take 1 tablet (20 mg total) by mouth daily. 90 tablet 3   No facility-administered medications prior to visit.    PAST MEDICAL HISTORY: Past Medical History:  Diagnosis Date  . Arthritis    "all over" (09/26/2017)  . Asthma   . Diabetes mellitus   . Fibromyalgia   . Heart disease   . High cholesterol   . History of blood transfusion    "related to a  surgery" (09/26/2017)  . Hypertension   . Memory difficulty 07/26/2017  . Myocardial infarction [facility])    "years ago" (09/26/2017)  . Pacemaker   . 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 Stroke [facility]) 02/2017   "affected my memory" (09/26/2017)  . Type II diabetes mellitus ([place])     PAST SURGICAL HISTORY: Past Surgical History:  Procedure Laterality Date  . CARDIAC CATHETERIZATION    . CATARACT EXTRACTION W/ INTRAOCULAR LENS  IMPLANT, BILATERAL Bilateral   . CHOLECYSTECTOMY    . COLONOSCOPY    . I & D EXTREMITY  03/31/2011   Procedure: IRRIGATION AND DEBRIDEMENT EXTREMITY;  Surgeon: Tennis Must, MD;  Location: [facility];  Service: Orthopedics;  Laterality: Left;  left long finger  . INSERT / REPLACE / REMOVE PACEMAKER    . KNEE ARTHROSCOPY Left   . MASS EXCISION  03/08/2011   Procedure: EXCISION MASS;  Surgeon: Tennis Must, MD;  Location: [facility];  Service: Orthopedics;  Laterality: Left;  Excision Mass Left Long Finger and Debridement of Distal Interphalangeal  Joint  . PPM GENERATOR CHANGEOUT N/A 01/18/2019   Procedure: PPM GENERATOR CHANGEOUT;  Surgeon: Deboraha Sprang, MD;  Location: [facility];  Service: Cardiovascular;  Laterality: N/A;  . TONSILLECTOMY    . TOTAL ABDOMINAL  HYSTERECTOMY    . TUBAL LIGATION      FAMILY HISTORY: Family History  Problem Relation Age of Onset  . Alzheimer's disease Mother   . Heart attack Father   . Diabetes Brother     SOCIAL HISTORY: Social History   Socioeconomic History  . Marital status: Widowed    Spouse name: Not on file  . Number of children: 2  . Years of education: College  . Highest education level: Not on file  Occupational History  . Not on file  Tobacco Use  . Smoking status: Never Smoker  . Smokeless tobacco: Never Used  Substance and Sexual Activity  . Alcohol use: Never  . Drug use: Never  . Sexual activity: Not Currently  Other Topics Concern  . Not on file  Social History Narrative   Lives with daughter   Caffeine use: diet drinks   diet coke out to eat   Rig thhanded    Social Determinants of Health   Financial Resource Strain:   . Difficulty of Paying Living Expenses: Not on file  Food Insecurity:   . Worried About Charity fundraiser in the Last Year: Not on file  . Ran Out of Food in the Last Year: Not on file  Transportation Needs:   . Lack of Transportation (Medical): Not on file  . Lack of Transportation (Non-Medical): Not on file  Physical Activity:   . Days of Exercise per Week: Not on file  . Minutes of Exercise per Session: Not on file  Stress:   . Feeling of Stress : Not on file  Social Connections:   . Frequency of Communication with Friends and Family: Not on file  . Frequency of Social Gatherings with Friends and Family: Not on file  . Attends Religious Services: Not on file  . Active Member of Clubs or Organizations: Not on file  . Attends Archivist Meetings: Not on file  . Marital Status: Not on file  Intimate Partner Violence:   . Fear of Current or Ex-Partner: Not on file  . Emotionally Abused: Not on file  . Physically Abused: Not on file  .  Sexually Abused: Not on file   PHYSICAL EXAM  Vitals:   03/07/19 1036  BP: 119/76  Pulse: 72    Temp: (!) 97.5 F (36.4 C)  TempSrc: Oral  Weight: 149 lb 9.6 oz (67.9 kg)  Height: _0  (1.651 m)   Body mass index is 24.89 kg/m.  Generalized: Well developed, in no acute distress  MMSE - Mini Mental State Exam 03/07/2019 08/29/2018 02/28/2018  Not completed: (No Data) (No Data) (No Data)  Orientation to time _1 Orientation to Place _2 Registration _3 Attention/ Calculation 0 0 5  Recall _4 Language- name 2 objects _5 Language- repeat _6 Language- follow 3 step command _7 Language- follow 3 step command-comments she grabbed the paper with her left hand - She grabbed the paper with both hands  Language- read & follow direction _8 Write a sentence _9 Copy design _10 Total score _11 Neurological examination  Mentation: Alert oriented to time, place, history taking. Follows all commands speech and language fluent Cranial nerve II-XII: Pupils were equal round reactive to light. Extraocular movements were full, visual field were full on confrontational test. Facial sensation and strength were normal.  Head turning and shoulder shrug  were normal and symmetric. Motor: The motor testing reveals 5 over 5 strength of all 4 extremities. Good symmetric motor tone is noted throughout.  Sensory: Sensory testing is intact to soft touch on all 4 extremities. No evidence of extinction is noted.  Coordination: Cerebellar testing reveals good finger-nose-finger and heel-to-shin bilaterally.  Gait and station: Gait is normal. Tandem gait is normal. Romberg is negative. No drift is seen.  Reflexes: Deep tendon reflexes are symmetric and normal bilaterally.   DIAGNOSTIC DATA (LABS, IMAGING, TESTING) - I reviewed patient records, labs, notes, testing and imaging myself where available.  Lab Results  Component Value Date   WBC 9.2 01/15/2019   HGB 13.2 01/15/2019   HCT 39.6 01/15/2019   MCV 95 01/15/2019   PLT 249 01/15/2019      Component  Value Date/Time   NA 149 (H) 01/15/2019 1412   K 4.6 01/15/2019 1412   CL 116 (H) 01/15/2019 1412   CO2 22 01/15/2019 1412   GLUCOSE 125 (H) 01/15/2019 1412   GLUCOSE 127 (H) 09/26/2017 0621   BUN 21 01/15/2019 1412   CREATININE 1.14 (H) 01/15/2019 1412   CALCIUM 9.1 01/15/2019 1412   PROT 5.6 (L) 11/28/2017 1148   ALBUMIN 3.6 11/28/2017 1148   AST 12 11/28/2017 1148   ALT 14 11/28/2017 1148   ALKPHOS 71 11/28/2017 1148   BILITOT 0.4 11/28/2017 1148   GFRNONAA 48 (L) 01/15/2019 1412   GFRAA 55 (L) 01/15/2019 1412   Lab Results  Component Value Date   CHOL 283 (H) 10/29/2018   HDL 52 10/29/2018   LDLCALC 187 (H) 10/29/2018   TRIG 231 (H) 10/29/2018   CHOLHDL 5.4 (H) 10/29/2018   No results found for: HGBA1C No results found for: VITAMINB12 Lab Results  Component Value Date   TSH 4.410 11/28/2017      ASSESSMENT AND PLAN 74 y.o. year old female  has a past medical history of Arthritis, Asthma, Diabetes mellitus, Fibromyalgia, Heart disease, High cholesterol, History of blood transfusion, Hypertension, Memory difficulty (07/26/2017), Myocardial infarction 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 here with:  1.  Memory loss 2.  Syncopal event May 2020, CT scan of the brain shows left cerebellar infarct, right occipital infarct, new since February 2019 CT scan, cannot have MRI of the brain due to pacemaker  She has done well since last seen.  Her memory remains stable, she will remain on Namenda.  She has not been able to tolerate Aricept in the past.  A CTA head and neck was done in July 2020 (report attached below), as result, aspirin is recommended, but she has significant allergy.  She remains on Eliquis, with pacemaker.  She will continue routine follow-up with her primary doctor, for management of vascular risk factors, including HTN and cholesterol.  She will follow-up here in 6 months or  sooner if needed.  I did advise if her symptoms worsen or she develops any new symptoms she should let us know.  CT angio head and neck 09/07/2018 IMPRESSION: 1. Intracranial atherosclerosis including severe bilateral proximal PCA stenoses with diffuse attenuation of both PCAs more distally. 2. Mild left M1 and intracranial ICA stenoses. 3. Mild bilateral cervical carotid artery atherosclerosis without significant stenosis. 4. Strongly dominant left vertebral artery without significant stenosis. 5. Chronic left cerebellar, right occipital, and right basal ganglia infarcts. 6. Aortic Atherosclerosis (ICD10-I70.0).  I spent 25 minutes with the patient. 50% of this time was spent discussing her plan of care.   Butler Denmark, AGNP-C, DNP 03/07/2019, 11:00 AM [facility] [address] [place], [place] [address] [phone]

## 2019-03-07 ENCOUNTER — Ambulatory Visit: Payer: Medicare HMO | Admitting: Neurology

## 2019-03-07 ENCOUNTER — Encounter: Payer: Self-pay | Admitting: Neurology

## 2019-03-07 ENCOUNTER — Other Ambulatory Visit: Payer: Self-pay

## 2019-03-07 VITALS — BP 119/76 | HR 72 | Temp 97.5°F | Ht 65.0 in | Wt 149.6 lb

## 2019-03-07 DIAGNOSIS — I639 Cerebral infarction, unspecified: Secondary | ICD-10-CM

## 2019-03-07 DIAGNOSIS — R413 Other amnesia: Secondary | ICD-10-CM

## 2019-03-07 MED ORDER — MEMANTINE HCL 10 MG PO TABS: 10.0000 mg | ORAL_TABLET | Freq: Two times a day (BID) | ORAL | 3 refills | Status: DC

## 2019-03-07 NOTE — Patient Instructions (Signed)
Continue taking Namenda, memory score is stable 20/30 today, try to start doing your word searches again  Continue follow- up with primary doctor   Return in 6 months

## 2019-03-07 NOTE — Progress Notes (Signed)
I have read the note, and I agree with the clinical assessment and plan.  Marinna Blane K Gaylord Seydel   

## 2019-03-12 ENCOUNTER — Other Ambulatory Visit: Payer: Self-pay

## 2019-03-12 ENCOUNTER — Ambulatory Visit (INDEPENDENT_AMBULATORY_CARE_PROVIDER_SITE_OTHER): Payer: Medicare HMO | Admitting: *Deleted

## 2019-03-12 DIAGNOSIS — T8189XD Other complications of procedures, not elsewhere classified, subsequent encounter: Secondary | ICD-10-CM

## 2019-03-12 NOTE — Patient Instructions (Signed)
Wash your incision at least once daily with Dial soap, water, and a clean washcloth. Continue to monitor for increased redness, drainage, swelling, fever, or chills. Keep open area covered with a bandaid and try to avoid touching the area. Call the [facility] at [phone] with any concerns.

## 2019-03-12 NOTE — Progress Notes (Signed)
Patient returned today for wound recheck with family present. Wound to left chest noted to heal well with edges well approximated with exception of small scab. Small scab noted at proximal edge of suture line. Wound cleaned with alcohol, scab removed, obvious stitch removed with scab. Per Dr. Caryl Comes, granulation tissue noted at opening. Place band-aid over open area. Dr. Caryl Comes instructed patient and daughter to wash site daily with dial soap and continue to monitor for s/s of infection. Follow up scheduled for 03/20/2019 while Dr. Caryl Comes in office. Patients daughter aware to call before then with any issues.

## 2019-03-20 ENCOUNTER — Other Ambulatory Visit: Payer: Self-pay

## 2019-03-20 ENCOUNTER — Ambulatory Visit (INDEPENDENT_AMBULATORY_CARE_PROVIDER_SITE_OTHER): Payer: Medicare HMO | Admitting: *Deleted

## 2019-03-20 DIAGNOSIS — T8189XD Other complications of procedures, not elsewhere classified, subsequent encounter: Secondary | ICD-10-CM

## 2019-03-20 NOTE — Progress Notes (Signed)
Wound recheck in clinic. Incision edges fully approximated and well healed. No signs/symptoms of infection noted. Patient and daughter verbalize understanding of instructions to call if any concerns prior to f/u with Dr. Caryl Comes on 04/22/19.

## 2019-04-21 DIAGNOSIS — I428 Other cardiomyopathies: Secondary | ICD-10-CM | POA: Insufficient documentation

## 2019-04-21 DIAGNOSIS — Z950 Presence of cardiac pacemaker: Secondary | ICD-10-CM | POA: Insufficient documentation

## 2019-04-22 ENCOUNTER — Ambulatory Visit (INDEPENDENT_AMBULATORY_CARE_PROVIDER_SITE_OTHER): Payer: Medicare HMO | Admitting: Internal Medicine

## 2019-04-22 ENCOUNTER — Other Ambulatory Visit: Payer: Self-pay

## 2019-04-22 ENCOUNTER — Encounter: Payer: Self-pay | Admitting: Internal Medicine

## 2019-04-22 VITALS — BP 122/60 | HR 75 | Ht 65.0 in | Wt 151.4 lb

## 2019-04-22 DIAGNOSIS — I428 Other cardiomyopathies: Secondary | ICD-10-CM | POA: Diagnosis not present

## 2019-04-22 DIAGNOSIS — I480 Paroxysmal atrial fibrillation: Secondary | ICD-10-CM | POA: Diagnosis not present

## 2019-04-22 DIAGNOSIS — Z950 Presence of cardiac pacemaker: Secondary | ICD-10-CM | POA: Diagnosis not present

## 2019-04-22 MED ORDER — FUROSEMIDE 40 MG PO TABS: ORAL_TABLET | ORAL | 3 refills | Status: DC

## 2019-04-22 NOTE — Patient Instructions (Signed)
Medication Instructions:  Your physician has recommended you make the following change in your medication:  * Increase Furosemide to 40mg  by mouth daily x 5 days then 40mg  every other day.   Labwork: None ordered.  Testing/Procedures: None ordered.  Follow-Up: Your physician wants you to follow-up in: 9 months with Dr Caryl Comes. You will receive a reminder letter in the mail two months in advance. If you don't receive a letter, please call our office to schedule the follow-up appointment.  Remote monitoring is used to monitor your Pacemaker of ICD from home. This monitoring reduces the number of office visits required to check your device to one time per year. It allows [place] to keep an eye on the functioning of your device to ensure it is working properly.   Any Other Special Instructions Will Be Listed Below (If Applicable).  If you need a refill on your cardiac medications before your next appointment, please call your pharmacy.

## 2019-04-22 NOTE — Progress Notes (Signed)
Patient Care Team: Hague, Rosalyn Charters, MD as PCP - General (Internal Medicine) Fay Records, MD as PCP - Cardiology (Cardiology)   HPI  Kristin Gomez is a 74 y.o. female Seen in follow-up for what is best as we can determine was a CRT-P implantation 2008  by Dr. Minna Merritts at [facility].  She underwent generator replacement in 2/17 and then again she underwent generator replacement 12/20.  She has a history of atrial fibrillation and is anticoagulated  Short of breath with mild exertion, 50 feet or less.  No nocturnal dyspnea but sleeps propped up on bed blocks.  Some edema.  No chest pain.    DATE TEST EF   5/19 Echo 60-65%   5/20 Echo  45-50%   9/20 Echo  50-55% E/E' 51   Date Cr K Hgb  10/19 1.12 4.5 13.9 (8/19)  12/20 1.14 4.6 41.6   Thromboembolic risk factors ( age -37, HTN-1, TIA/CVA-2, DM-1, CHF-1, Gender-1) for a CHADSVASc Score of =>7 she is anticoagulated with apixaban.  On Anticoagulation;  No bleeding issues    Records and Results Reviewed   Past Medical History:  Diagnosis Date  . Arthritis    "all over" (09/26/2017)  . Asthma   . Diabetes mellitus   . Fibromyalgia   . Heart disease   . High cholesterol   . History of blood transfusion    "related to a surgery" (09/26/2017)  . Hypertension   . Memory difficulty 07/26/2017  . Myocardial infarction [facility])    "years ago" (09/26/2017)  . Pacemaker   . 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)   . Stroke [facility]) 02/2017   "affected my memory" (09/26/2017)  . Type II diabetes mellitus ([place])     Past Surgical History:  Procedure Laterality Date  . CARDIAC CATHETERIZATION    . CATARACT EXTRACTION W/ INTRAOCULAR LENS  IMPLANT, BILATERAL Bilateral   . CHOLECYSTECTOMY    . COLONOSCOPY    . I & D EXTREMITY  03/31/2011   Procedure: IRRIGATION AND DEBRIDEMENT EXTREMITY;  Surgeon: Tennis Must, MD;  Location: [place];  Service: Orthopedics;  Laterality: Left;  left long finger  .  INSERT / REPLACE / REMOVE PACEMAKER    . KNEE ARTHROSCOPY Left   . MASS EXCISION  03/08/2011   Procedure: EXCISION MASS;  Surgeon: Tennis Must, MD;  Location: [place];  Service: Orthopedics;  Laterality: Left;  Excision Mass Left Long Finger and Debridement of Distal Interphalangeal  Joint  . PPM GENERATOR CHANGEOUT N/A 01/18/2019   Procedure: PPM GENERATOR CHANGEOUT;  Surgeon: Deboraha Sprang, MD;  Location: [place] CV LAB;  Service: Cardiovascular;  Laterality: N/A;  . TONSILLECTOMY    . TOTAL ABDOMINAL HYSTERECTOMY    . TUBAL LIGATION      Current Meds  Medication Sig  . albuterol (PROVENTIL HFA;VENTOLIN HFA) 108 (90 Base) MCG/ACT inhaler Inhale 2 puffs into the lungs every 6 (six) hours as needed for wheezing or shortness of breath.   . carvedilol (COREG) 12.5 MG tablet Take 1 tablet (12.5 mg total) by mouth 2 (two) times daily.  . Cholecalciferol (VITAMIN D3) 50 MCG (2000 UT) capsule Take 2,000 Units by mouth daily.   . Cyanocobalamin (B-12 COMPLIANCE INJECTION) 1000 MCG/ML KIT Inject 1 mL into the muscle every 14 (fourteen) days.   . diphenhydramine-acetaminophen (TYLENOL PM) 25-500 MG TABS tablet Take 1-2 tablets by mouth at bedtime as needed (pain).  Marland Kitchen ELIQUIS  5 MG TABS tablet Take 1 tablet by mouth twice daily  . furosemide (LASIX) 20 MG tablet Take 1 tablet (20 mg total) by mouth daily.  Marland Kitchen glipiZIDE (GLUCOTROL XL) 5 MG 24 hr tablet Take 5 mg by mouth at bedtime.  Marland Kitchen ipratropium-albuterol (DUONEB) 0.5-2.5 (3) MG/3ML SOLN Take 3 mLs by nebulization every 6 (six) hours as needed (for shortness of breath or wheezing).   Marland Kitchen levothyroxine (EUTHYROX) 25 MCG tablet Take 25 mcg by mouth daily before breakfast.  . memantine (NAMENDA) 10 MG tablet Take 1 tablet (10 mg total) by mouth 2 (two) times daily.  . methylPREDNISolone (MEDROL) 4 MG tablet Take 4 mg by mouth as needed (Fibermyalgia).   Marland Kitchen omeprazole (PRILOSEC) 20 MG capsule Take 20 mg by mouth daily as needed (acid  reflux).   . potassium chloride SA (K-DUR,KLOR-CON) 20 MEQ tablet Take 20 mEq by mouth daily.  . rosuvastatin (CRESTOR) 20 MG tablet Take 1 tablet (20 mg total) by mouth daily.    Allergies  Allergen Reactions  . Aspirin Shortness Of Breath and Other (See Comments)    Patient states that this caused an asthma attack Patient states that is causes an asthma attack.  . Azithromycin Anaphylaxis  . Infliximab Hives    remicade  . Methotrexate Hives  . Mushroom Extract Complex Anaphylaxis, Shortness Of Breath and Swelling  . Nsaids Shortness Of Breath and Other (See Comments)    Asthma attack  . Aricept [Donepezil Hcl]     Diarrhea  . Avelox [Moxifloxacin Hcl In Nacl] Swelling    Site of swelling not recalled  . Diovan [Valsartan] Swelling    Site of swelling not recalled  . Amoxicillin Rash  . Atorvastatin Swelling and Other (See Comments)    Exacerbated Psoriatic arthritis   . Contrast Media [Iodinated Diagnostic Agents] Rash  . Latex Rash  . Metformin Rash  . Metformin And Related Rash    ??  . Penicillins Rash    Has patient had a PCN reaction causing immediate rash, facial/tongue/throat swelling, SOB or lightheadedness with hypotension: Yes Has patient had a PCN reaction causing severe rash involving mucus membranes or skin necrosis: No Has patient had a PCN reaction that required hospitalization: No Has patient had a PCN reaction occurring within the last 10 years: No If all of the above answers are "NO", then may proceed with Cephalosporin use.   Marland Kitchen Percocet [Oxycodone-Acetaminophen] Nausea Only  . Prednisone Rash    Can only be tolerated with Benadryl  . Propofol Rash  . Sulfa Antibiotics Nausea Only and Rash  . Vicodin [Hydrocodone-Acetaminophen] Nausea Only      Review of Systems negative except from HPI and PMH  Physical Exam BP 122/60   Pulse 75   Ht 5' 5"  (1.651 m)   Wt 151 lb 6.4 oz (68.7 kg)   SpO2 97%   BMI 25.19 kg/m  Well developed and well  nourished in mild shortness of breath at rest HENT normal E scleral and icterus clear Neck Supple JVP less than 8 cm carotids brisk and full Clear to ausculation decreased breath sounds  Regular rate and rhythm, no murmurs gallops or rub Soft with active bowel sounds No clubbing cyanosis Trace Edema Alert and oriented, grossly normal motor and sensory function Skin Warm and Dry  ECG sinus w P-synchronous/ AV  pacing with neg QRS lead V1 and upright lead 1  CrCl cannot be calculated (Patient's most recent lab result is older than the maximum 21 days  allowed.). Chest x-ray reviewed 8/19 no evidence of COPD  Assessment and  Plan  NICM  CRT-P  HFpEF-acute/chronic  LV threshold elevated   Afib RVR  infreq  Rheumatoid arthritis  CVA-recurrent  Cardiomyopathy n    LV function remains stable.  Continue her current carvedilol.  No significant atrial fibrillation.  We will continue her anticoagulation with Eliquis.  Hemoglobins are stable.  Modest volume overload.  We will increase her furosemide from 20--40 daily x7 days and then she will be seeing Dr. Harrington Challenger I would recommend changing to 40 q. OD-- her E/E' in September was greater than 20.  Wonder whether she would benefit from a Chest CT   Current medicines are reviewed at length with the patient today .  The patient does no have concerns regarding medicines.

## 2019-04-23 ENCOUNTER — Ambulatory Visit (INDEPENDENT_AMBULATORY_CARE_PROVIDER_SITE_OTHER): Payer: Medicare HMO | Admitting: *Deleted

## 2019-04-23 DIAGNOSIS — I428 Other cardiomyopathies: Secondary | ICD-10-CM | POA: Diagnosis not present

## 2019-04-23 LAB — CUP PACEART REMOTE DEVICE CHECK
Battery Remaining Longevity: 74 mo
Battery Remaining Percentage: 95.5 %
Battery Voltage: 2.98 V
Brady Statistic AP VP Percent: 7.1 %
Brady Statistic AP VS Percent: 1 %
Brady Statistic AS VP Percent: 92 %
Brady Statistic AS VS Percent: 1 %
Brady Statistic RA Percent Paced: 6.9 %
Date Time Interrogation Session: 20210309020032
Implantable Lead Implant Date: 20081110
Implantable Lead Implant Date: 20081110
Implantable Lead Implant Date: 20081110
Implantable Lead Location: 753858
Implantable Lead Location: 753859
Implantable Lead Location: 753860
Implantable Pulse Generator Implant Date: 20201204
Lead Channel Impedance Value: 460 Ohm
Lead Channel Impedance Value: 510 Ohm
Lead Channel Impedance Value: 780 Ohm
Lead Channel Pacing Threshold Amplitude: 0.75 V
Lead Channel Pacing Threshold Amplitude: 1.375 V
Lead Channel Pacing Threshold Amplitude: 2 V
Lead Channel Pacing Threshold Pulse Width: 0.4 ms
Lead Channel Pacing Threshold Pulse Width: 0.4 ms
Lead Channel Pacing Threshold Pulse Width: 1 ms
Lead Channel Sensing Intrinsic Amplitude: 4 mV
Lead Channel Setting Pacing Amplitude: 1.75 V
Lead Channel Setting Pacing Amplitude: 2.375
Lead Channel Setting Pacing Amplitude: 2.5 V
Lead Channel Setting Pacing Pulse Width: 0.4 ms
Lead Channel Setting Pacing Pulse Width: 1.5 ms
Lead Channel Setting Sensing Sensitivity: 4 mV
Pulse Gen Model: 3222
Pulse Gen Serial Number: 9132853

## 2019-04-24 NOTE — Progress Notes (Signed)
PPM Remote  

## 2019-05-02 ENCOUNTER — Ambulatory Visit: Payer: Medicare HMO | Admitting: Internal Medicine

## 2019-05-06 LAB — CUP PACEART INCLINIC DEVICE CHECK
Battery Remaining Longevity: 67 mo
Battery Voltage: 2.98 V
Brady Statistic RA Percent Paced: 23 %
Brady Statistic RV Percent Paced: 100 %
Date Time Interrogation Session: 20210308143100
Implantable Lead Implant Date: 20081110
Implantable Lead Implant Date: 20081110
Implantable Lead Implant Date: 20081110
Implantable Lead Location: 753858
Implantable Lead Location: 753859
Implantable Lead Location: 753860
Implantable Pulse Generator Implant Date: 20201204
Lead Channel Pacing Threshold Amplitude: 0.75 V
Lead Channel Pacing Threshold Amplitude: 1.125 V
Lead Channel Pacing Threshold Pulse Width: 0.4 ms
Lead Channel Pacing Threshold Pulse Width: 0.4 ms
Lead Channel Sensing Intrinsic Amplitude: 4 mV
Lead Channel Setting Pacing Amplitude: 1.75 V
Lead Channel Setting Pacing Amplitude: 2.125
Lead Channel Setting Pacing Amplitude: 2.5 V
Lead Channel Setting Pacing Pulse Width: 0.4 ms
Lead Channel Setting Pacing Pulse Width: 1.5 ms
Lead Channel Setting Sensing Sensitivity: 4 mV
Pulse Gen Model: 3222
Pulse Gen Serial Number: 9132853

## 2019-05-09 ENCOUNTER — Encounter: Payer: Self-pay | Admitting: Internal Medicine

## 2019-05-09 ENCOUNTER — Ambulatory Visit: Payer: Medicare HMO | Admitting: Internal Medicine

## 2019-05-09 ENCOUNTER — Other Ambulatory Visit: Payer: Self-pay

## 2019-05-09 VITALS — BP 112/62 | HR 93 | Ht 65.0 in | Wt 147.2 lb

## 2019-05-09 DIAGNOSIS — I5022 Chronic systolic (congestive) heart failure: Secondary | ICD-10-CM

## 2019-05-09 DIAGNOSIS — I10 Essential (primary) hypertension: Secondary | ICD-10-CM

## 2019-05-09 DIAGNOSIS — I428 Other cardiomyopathies: Secondary | ICD-10-CM | POA: Diagnosis not present

## 2019-05-09 DIAGNOSIS — I2510 Atherosclerotic heart disease of native coronary artery without angina pectoris: Secondary | ICD-10-CM | POA: Diagnosis not present

## 2019-05-09 NOTE — Progress Notes (Signed)
Cardiology Office Note   Date:  05/09/2019   ID:  Kristin Gomez, DOB [birth year], MRN [id number]  PCP:  Bonnita Nasuti, MD  Cardiologist:   Dorris Carnes, MD   F/U of HTN and chronic systolic CHF     History of Present Illness: Kristin Gomez is a 74 y.o. female with a history of HTN, Type II DM, HL, CHF and PPM  Echo in October 2019 LVEF was 35 to 40%   I last saw her in clinic in Sept 2020 She is also followed by Olin Pia for PPM  Seen earlier this month  Volume was up on exam  Recomm lasix for 7 days the 40 every other day     The pt comes in with her daughter today   Daughter says her mom fell out of bed on 3/18  Did not pass out   Her BP wa up but then on 3/19 was 92/44.    The daughter says her mom's breathing is short   Denies CP   No racing of her heart     Current Meds  Medication Sig  . albuterol (PROVENTIL HFA;VENTOLIN HFA) 108 (90 Base) MCG/ACT inhaler Inhale 2 puffs into the lungs every 6 (six) hours as needed for wheezing or shortness of breath.   . carvedilol (COREG) 12.5 MG tablet Take 1 tablet (12.5 mg total) by mouth 2 (two) times daily.  . Cholecalciferol (VITAMIN D3) 50 MCG (2000 UT) capsule Take 2,000 Units by mouth daily.   . Cyanocobalamin (B-12 COMPLIANCE INJECTION) 1000 MCG/ML KIT Inject 1 mL into the muscle every 14 (fourteen) days.   . diphenhydramine-acetaminophen (TYLENOL PM) 25-500 MG TABS tablet Take 1-2 tablets by mouth at bedtime as needed (pain).  Marland Kitchen ELIQUIS 5 MG TABS tablet Take 1 tablet by mouth twice daily  . furosemide (LASIX) 40 MG tablet 1 tablet every other day  . glipiZIDE (GLUCOTROL XL) 5 MG 24 hr tablet Take 5 mg by mouth at bedtime.  Marland Kitchen ipratropium-albuterol (DUONEB) 0.5-2.5 (3) MG/3ML SOLN Take 3 mLs by nebulization every 6 (six) hours as needed (for shortness of breath or wheezing).   Marland Kitchen levothyroxine (EUTHYROX) 25 MCG tablet Take 25 mcg by mouth daily before breakfast.  . memantine (NAMENDA) 10 MG tablet Take 1 tablet (10 mg total) by mouth  2 (two) times daily.  . methylPREDNISolone (MEDROL) 4 MG tablet Take 4 mg by mouth as needed (Fibermyalgia).   . mupirocin ointment (BACTROBAN) 2 % Apply 2 g topically in the morning and at bedtime. For 7 days  . omeprazole (PRILOSEC) 20 MG capsule Take 20 mg by mouth daily as needed (acid reflux).   . potassium chloride SA (K-DUR,KLOR-CON) 20 MEQ tablet Take 20 mEq by mouth daily.  . rosuvastatin (CRESTOR) 20 MG tablet Take 1 tablet (20 mg total) by mouth daily.     Allergies:   Aspirin, Azithromycin, Infliximab, Methotrexate, Mushroom extract complex, Nsaids, Aricept [donepezil hcl], Avelox [moxifloxacin hcl in nacl], Diovan [valsartan], Amoxicillin, Atorvastatin, Contrast media [iodinated diagnostic agents], Latex, Metformin, Metformin and related, Penicillins, Percocet [oxycodone-acetaminophen], Prednisone, Propofol, Sulfa antibiotics, and Vicodin [hydrocodone-acetaminophen]   Past Medical History:  Diagnosis Date  . Arthritis    "all over" (09/26/2017)  . Asthma   . Diabetes mellitus   . Fibromyalgia   . Heart disease   . High cholesterol   . History of blood transfusion    "related to a surgery" (09/26/2017)  . Hypertension   . Memory difficulty 07/26/2017  .  Myocardial infarction [facility])    "years ago" (09/26/2017)  . Pacemaker   . Psoriatic arthritis, destructive type ([place])   . Stroke [facility]) 02/2017   "affected my memory" (09/26/2017)  . Type II diabetes mellitus ([place])     Past Surgical History:  Procedure Laterality Date  . CARDIAC CATHETERIZATION    . CATARACT EXTRACTION W/ INTRAOCULAR LENS  IMPLANT, BILATERAL Bilateral   . CHOLECYSTECTOMY    . COLONOSCOPY    . I & D EXTREMITY  03/31/2011   Procedure: IRRIGATION AND DEBRIDEMENT EXTREMITY;  Surgeon: Tennis Must, MD;  Location: [place];  Service: Orthopedics;  Laterality: Left;  left long finger  . INSERT / REPLACE / REMOVE PACEMAKER    . KNEE ARTHROSCOPY Left   . MASS EXCISION  03/08/2011   Procedure:  EXCISION MASS;  Surgeon: Tennis Must, MD;  Location: [place];  Service: Orthopedics;  Laterality: Left;  Excision Mass Left Long Finger and Debridement of Distal Interphalangeal  Joint  . PPM GENERATOR CHANGEOUT N/A 01/18/2019   Procedure: PPM GENERATOR CHANGEOUT;  Surgeon: Deboraha Sprang, MD;  Location: [place] CV [facility];  Service: Cardiovascular;  Laterality: N/A;  . TONSILLECTOMY    . TOTAL ABDOMINAL HYSTERECTOMY    . TUBAL LIGATION       Social History:  The patient  reports that she has never smoked. She has never used smokeless tobacco. She reports that she does not drink alcohol or use drugs.   Family History:  The patient's family history includes Alzheimer's disease in her mother; Diabetes in her brother; Heart attack in her father.    ROS:  Please see the history of present illness. All other systems are reviewed and  Negative to the above problem except as noted.    PHYSICAL EXAM: VS:  BP 112/62   Pulse 93   Ht 5' 5"  (1.651 m)   Wt 147 lb 3.2 oz (66.8 kg)   SpO2 97%   BMI 24.50 kg/m   GEN: Well nourished, well developed, in no acute distress  HEENT: normal  Neck: no JVD, carotid bruits, Cardiac: RRR; no murmurs, rubs, or gallops,1+ edema  Respiratory:  clear to auscultation bilaterally, normal work of breathing GI: soft, nontender, nondistended, + BS  No hepatomegaly  MS: no deformity Moving all extremities   Skin: Bruises  Psych:  Flat affect     EKG:  EKG is ordered today.  Atrial fib   87 bpm  Ventricular paced.     Lipid Panel    Component Value Date/Time   CHOL 283 (H) 10/29/2018 1537   TRIG 231 (H) 10/29/2018 1537   HDL 52 10/29/2018 1537   CHOLHDL 5.4 (H) 10/29/2018 1537   LDLCALC 187 (H) 10/29/2018 1537      Wt Readings from Last 3 Encounters:  05/09/19 147 lb 3.2 oz (66.8 kg)  04/22/19 151 lb 6.4 oz (68.7 kg)  03/07/19 149 lb 9.6 oz (67.9 kg)      ASSESSMENT AND PLAN:  1  Acute on chronic systolic CHF   VOlume does  appear to be mildly increased   Recomm:  1.  Limited echo with [facility] to confirm LVEF  2. Labs (BMET, BNP) to guide further dieuretc therapy.    2  Atrial fibrillation  Continue Eliquis  3  PPM  Follows with S Klein  4  HL    Lipids in Sept LDL 187   On Crestor    Will need to  follow up  5  HTN  Good control     Current medicines are reviewed at length with the patient today.  The patient does not have concerns regarding medicines.  Signed, Dorris Carnes, MD  05/09/2019 3:11 PM    [facility] [address], [place], [place]  [address] Phone: [phone]; Fax: [phone]

## 2019-05-09 NOTE — Patient Instructions (Signed)
Medication Instructions:  No changes today *If you need a refill on your cardiac medications before your next appointment, please call your pharmacy*   Lab Work: Today: cbc, bmet, bnp If you have labs (blood work) drawn today and your tests are completely normal, you will receive your results only by: Marland Kitchen MyChart Message (if you have MyChart) OR . A paper copy in the mail If you have any lab test that is abnormal or we need to change your treatment, we will call you to review the results.   Testing/Procedures: (LIMITED ECHO W DIFINITY) Your physician has requested that you have an echocardiogram. Echocardiography is a painless test that uses sound waves to create images of your heart. It provides your doctor with information about the size and shape of your heart and how well your heart's chambers and valves are working. This procedure takes approximately one hour. There are no restrictions for this procedure.     Follow-Up: At [facility], you and your health needs are our priority.  As part of our continuing mission to provide you with exceptional heart care, we have created designated Provider Care Teams.  These Care Teams include your primary Cardiologist (physician) and Advanced Practice Providers (APPs -  Physician Assistants and Nurse Practitioners) who all work together to provide you with the care you need, when you need it.  We recommend signing up for the patient portal called "MyChart".  Sign up information is provided on this After Visit Summary.  MyChart is used to connect with patients for Virtual Visits (Telemedicine).  Patients are able to view lab/test results, encounter notes, upcoming appointments, etc.  Non-urgent messages can be sent to your provider as well.   To learn more about what you can do with MyChart, go to [email or web address].    Your next appointment:   4 week(s)  The format for your next appointment:   In Person  Provider:   You may see Dorris Carnes, MD or one of the following Advanced Practice Providers on your designated Care Team:    Richardson Dopp, PA-C  [place], [place]  Daune Perch, NP    Other Instructions

## 2019-05-10 LAB — BASIC METABOLIC PANEL
BUN/Creatinine Ratio: 16 (ref 12–28)
BUN: 18 mg/dL (ref 8–27)
CO2: 22 mmol/L (ref 20–29)
Calcium: 9.2 mg/dL (ref 8.7–10.3)
Chloride: 109 mmol/L — ABNORMAL HIGH (ref 96–106)
Creatinine, Ser: 1.14 mg/dL — ABNORMAL HIGH (ref 0.57–1.00)
GFR calc Af Amer: 55 mL/min/{1.73_m2} — ABNORMAL LOW (ref >59)
GFR calc non Af Amer: 47 mL/min/{1.73_m2} — ABNORMAL LOW (ref >59)
Glucose: 107 mg/dL — ABNORMAL HIGH (ref 65–99)
Potassium: 4 mmol/L (ref 3.5–5.2)
Sodium: 146 mmol/L — ABNORMAL HIGH (ref 134–144)

## 2019-05-10 LAB — CBC
Hematocrit: 36.6 % (ref 34.0–46.6)
Hemoglobin: 12.6 g/dL (ref 11.1–15.9)
MCH: 31.5 pg (ref 26.6–33.0)
MCHC: 34.4 g/dL (ref 31.5–35.7)
MCV: 92 fL (ref 79–97)
Platelets: 280 10*3/uL (ref 150–450)
RBC: 4 x10E6/uL (ref 3.77–5.28)
RDW: 12.9 % (ref 11.7–15.4)
WBC: 7.7 10*3/uL (ref 3.4–10.8)

## 2019-05-10 LAB — PRO B NATRIURETIC PEPTIDE: NT-Pro BNP: 443 pg/mL — ABNORMAL HIGH (ref 0–301)

## 2019-05-13 ENCOUNTER — Telehealth: Payer: Self-pay | Admitting: *Deleted

## 2019-05-13 DIAGNOSIS — Z79899 Other long term (current) drug therapy: Secondary | ICD-10-CM

## 2019-05-13 MED ORDER — FUROSEMIDE 20 MG PO TABS: 20.0000 mg | ORAL_TABLET | Freq: Every day | ORAL | 3 refills | Status: DC

## 2019-05-13 NOTE — Telephone Encounter (Signed)
-----   Message from Dorris Carnes V, MD sent at 05/12/2019 10:19 PM EDT ----- CBC is normal Kidney function is OK   Electrolytes oK Fluid is up some  I would recomm increasing lasix to every day  Check BMET in 10 days

## 2019-05-23 ENCOUNTER — Other Ambulatory Visit: Payer: Self-pay | Admitting: Emergency Medicine

## 2019-05-23 DIAGNOSIS — Z79899 Other long term (current) drug therapy: Secondary | ICD-10-CM

## 2019-05-24 ENCOUNTER — Ambulatory Visit (HOSPITAL_COMMUNITY): Payer: Medicare HMO | Attending: Cardiology

## 2019-05-24 ENCOUNTER — Other Ambulatory Visit: Payer: Self-pay

## 2019-05-24 DIAGNOSIS — I2510 Atherosclerotic heart disease of native coronary artery without angina pectoris: Secondary | ICD-10-CM

## 2019-05-24 DIAGNOSIS — I10 Essential (primary) hypertension: Secondary | ICD-10-CM

## 2019-05-24 DIAGNOSIS — I5022 Chronic systolic (congestive) heart failure: Secondary | ICD-10-CM

## 2019-05-24 DIAGNOSIS — I428 Other cardiomyopathies: Secondary | ICD-10-CM | POA: Diagnosis present

## 2019-05-24 LAB — BASIC METABOLIC PANEL
BUN/Creatinine Ratio: 18 (ref 12–28)
BUN: 22 mg/dL (ref 8–27)
CO2: 21 mmol/L (ref 20–29)
Calcium: 9 mg/dL (ref 8.7–10.3)
Chloride: 107 mmol/L — ABNORMAL HIGH (ref 96–106)
Creatinine, Ser: 1.21 mg/dL — ABNORMAL HIGH (ref 0.57–1.00)
GFR calc Af Amer: 51 mL/min/{1.73_m2} — ABNORMAL LOW (ref >59)
GFR calc non Af Amer: 44 mL/min/{1.73_m2} — ABNORMAL LOW (ref >59)
Glucose: 103 mg/dL — ABNORMAL HIGH (ref 65–99)
Potassium: 3.7 mmol/L (ref 3.5–5.2)
Sodium: 146 mmol/L — ABNORMAL HIGH (ref 134–144)

## 2019-05-24 MED ORDER — PERFLUTREN LIPID MICROSPHERE
1.0000 mL | INTRAVENOUS | Status: AC | PRN
  Administered 2019-05-24: 1 mL via INTRAVENOUS

## 2019-05-29 ENCOUNTER — Telehealth: Payer: Self-pay

## 2019-05-29 NOTE — Telephone Encounter (Signed)
Pts daughter [name] on [place] verbalized understanding of DR. Lorin Mercy' lab results and reports the pt is still fatigued but urinating often with the increased Lasix and has lost 2-3 lbs and feels she is improving.... she will keep f/u 06/13/19 but will call sooner of needed.

## 2019-05-29 NOTE — Telephone Encounter (Signed)
-----   Message from Fay Records, MD sent at 05/24/2019  9:45 AM EDT ----- Electrolytes and kidney function are OK  Keep on same meds    Note lasix inceased  Is she feeling OK?

## 2019-06-12 NOTE — Progress Notes (Signed)
Cardiology Office Note   Date:  06/13/2019   ID:  Kristin Gomez, DOB 10-26-1945, MRN [id number]  PCP:  Bonnita Nasuti, MD  Cardiologist:   Dorris Carnes, MD   F/U of HTN and chronic systolic CHF     History of Present Illness: Kristin Gomez is a 74 y.o. female with a history of HTN, Type II DM, HL, CHF and PPM  Echo in October 2019 LVEF was 35 to 40%     I saw the pt in March 2021  She had complaints of SOB and fatigue   I recomm a repeat echo   I have reviewed  WIth afib accurate determination of LVEF difficult   I think it is better than reported, similar to previous  Based on labs I recomm that she increase lasix to daily  Since seen and since increase she hs done better  Breathing is OK   She sleeps well at night  Doesn't get out much with allergies  Around house she gets around [facility] says most of her  trouble is from arthritis     Current Meds  Medication Sig  . albuterol (PROVENTIL HFA;VENTOLIN HFA) 108 (90 Base) MCG/ACT inhaler Inhale 2 puffs into the lungs every 6 (six) hours as needed for wheezing or shortness of breath.   . carvedilol (COREG) 12.5 MG tablet Take 1 tablet (12.5 mg total) by mouth 2 (two) times daily.  . Cholecalciferol (VITAMIN D3) 50 MCG (2000 UT) capsule Take 2,000 Units by mouth daily.   . Cyanocobalamin (B-12 COMPLIANCE INJECTION) 1000 MCG/ML KIT Inject 1 mL into the muscle every 14 (fourteen) days.   . diphenhydramine-acetaminophen (TYLENOL PM) 25-500 MG TABS tablet Take 1-2 tablets by mouth at bedtime as needed (pain).  Marland Kitchen ELIQUIS 5 MG TABS tablet Take 1 tablet by mouth twice daily  . furosemide (LASIX) 20 MG tablet Take 1 tablet (20 mg total) by mouth daily.  Marland Kitchen glipiZIDE (GLUCOTROL XL) 5 MG 24 hr tablet Take 5 mg by mouth at bedtime.  Marland Kitchen ipratropium-albuterol (DUONEB) 0.5-2.5 (3) MG/3ML SOLN Take 3 mLs by nebulization every 6 (six) hours as needed (for shortness of breath or wheezing).   Marland Kitchen levothyroxine (EUTHYROX) 25 MCG tablet Take 25 mcg by mouth  daily before breakfast.  . memantine (NAMENDA) 10 MG tablet Take 1 tablet (10 mg total) by mouth 2 (two) times daily.  . methylPREDNISolone (MEDROL) 4 MG tablet Take 4 mg by mouth as needed (Fibermyalgia).   . mupirocin ointment (BACTROBAN) 2 % Apply 2 g topically in the morning and at bedtime. For 7 days  . omeprazole (PRILOSEC) 20 MG capsule Take 20 mg by mouth daily as needed (acid reflux).   . potassium chloride SA (K-DUR,KLOR-CON) 20 MEQ tablet Take 20 mEq by mouth daily.  . rosuvastatin (CRESTOR) 20 MG tablet Take 1 tablet (20 mg total) by mouth daily.     Allergies:   Aspirin, Azithromycin, Infliximab, Methotrexate, Mushroom extract complex, Nsaids, Aricept [donepezil hcl], Avelox [moxifloxacin hcl in nacl], Diovan [valsartan], Amoxicillin, Atorvastatin, Contrast media [iodinated diagnostic agents], Latex, Metformin, Metformin and related, Penicillins, Percocet [oxycodone-acetaminophen], Prednisone, Propofol, Sulfa antibiotics, and Vicodin [hydrocodone-acetaminophen]   Past Medical History:  Diagnosis Date  . Arthritis    "all over" (09/26/2017)  . Asthma   . Diabetes mellitus   . Fibromyalgia   . Heart disease   . High cholesterol   . History of blood transfusion    "related to a surgery" (09/26/2017)  .  Hypertension   . Memory difficulty 07/26/2017  . Myocardial infarction [facility])    "years ago" (09/26/2017)  . Pacemaker   . Psoriatic arthritis, destructive type ([place])   . Stroke [facility]) 02/2017   "affected my memory" (09/26/2017)  . Type II diabetes mellitus ([place])     Past Surgical History:  Procedure Laterality Date  . CARDIAC CATHETERIZATION    . CATARACT EXTRACTION W/ INTRAOCULAR LENS  IMPLANT, BILATERAL Bilateral   . CHOLECYSTECTOMY    . COLONOSCOPY    . I & D EXTREMITY  03/31/2011   Procedure: IRRIGATION AND DEBRIDEMENT EXTREMITY;  Surgeon: Tennis Must, MD;  Location: [place];  Service: Orthopedics;  Laterality: Left;  left long finger  . INSERT /  REPLACE / REMOVE PACEMAKER    . KNEE ARTHROSCOPY Left   . MASS EXCISION  03/08/2011   Procedure: EXCISION MASS;  Surgeon: Tennis Must, MD;  Location: [place];  Service: Orthopedics;  Laterality: Left;  Excision Mass Left Long Finger and Debridement of Distal Interphalangeal  Joint  . PPM GENERATOR CHANGEOUT N/A 01/18/2019   Procedure: PPM GENERATOR CHANGEOUT;  Surgeon: Deboraha Sprang, MD;  Location: [place] [facility];  Service: Cardiovascular;  Laterality: N/A;  . TONSILLECTOMY    . TOTAL ABDOMINAL HYSTERECTOMY    . TUBAL LIGATION       Social History:  The patient  reports that she has never smoked. She has never used smokeless tobacco. She reports that she does not drink alcohol or use drugs.   Family History:  The patient's family history includes Alzheimer's disease in her mother; Diabetes in her brother; Heart attack in her father.    ROS:  Please see the history of present illness. All other systems are reviewed and  Negative to the above problem except as noted.    PHYSICAL EXAM: VS:  BP 100/64   Pulse 79   Ht 5' 5"  (1.651 m)   Wt 146 lb (66.2 kg)   SpO2 100%   BMI 24.30 kg/m   GEN: 74 yo  in no acute distress  HEENT: normal  Neck: no JVD Cardiac: Irreg irreg  No S3  No signif  LE  edema  Respiratory:  clear to auscultation bilaterally, normal work of breathing GI: soft  Mild diffuse tenderness  nondistended, + BS  No hepatomegaly  MS: no deformity Moving all extremities   Skin: Bruises     EKG  Not ordered today    Lipid Panel    Component Value Date/Time   CHOL 283 (H) 10/29/2018 1537   TRIG 231 (H) 10/29/2018 1537   HDL 52 10/29/2018 1537   CHOLHDL 5.4 (H) 10/29/2018 1537   LDLCALC 187 (H) 10/29/2018 1537      Wt Readings from Last 3 Encounters:  06/13/19 146 lb (66.2 kg)  05/09/19 147 lb 3.2 oz (66.8 kg)  04/22/19 151 lb 6.4 oz (68.7 kg)      ASSESSMENT AND PLAN:  1  Chronic diastolic/ systolic  CHF   I have reviewed echo  from March   LVEF is probably mildly down.   I think better than reported/ The pt says on the higher dose of diuretic she is feeling better   She still has fatigue (I am not convinced cardiac)  Breathing is OK for what she does  Activity is Limited by arthritis  2  Atrial fibrillation   Rate control   Continue Eliquis  3  PPM  Follows  with Olin Pia  Appt in December    4  HL    Lipids in Sept LDL 187   On Crestor    Will need to follow up  5  HTN  Good control  She denies dizziness  Plan for f/u in Sept 2021     Current medicines are reviewed at length with the patient today.  The patient does not have concerns regarding medicines.  Signed, Dorris Carnes, MD  06/13/2019 11:18 AM    [address], [place], [place]  [address] Phone: [phone]; Fax: [phone]

## 2019-06-13 ENCOUNTER — Encounter: Payer: Self-pay | Admitting: Internal Medicine

## 2019-06-13 ENCOUNTER — Ambulatory Visit: Payer: Medicare HMO | Admitting: Internal Medicine

## 2019-06-13 ENCOUNTER — Other Ambulatory Visit: Payer: Self-pay

## 2019-06-13 VITALS — BP 100/64 | HR 79 | Ht 65.0 in | Wt 146.0 lb

## 2019-06-13 DIAGNOSIS — I10 Essential (primary) hypertension: Secondary | ICD-10-CM | POA: Diagnosis not present

## 2019-06-13 DIAGNOSIS — E782 Mixed hyperlipidemia: Secondary | ICD-10-CM

## 2019-06-13 NOTE — Patient Instructions (Signed)
Medication Instructions:  No changes *If you need a refill on your cardiac medications before your next appointment, please call your pharmacy*   Lab Work: none If you have labs (blood work) drawn today and your tests are completely normal, you will receive your results only by: Marland Kitchen MyChart Message (if you have MyChart) OR . A paper copy in the mail If you have any lab test that is abnormal or we need to change your treatment, we will call you to review the results.   Testing/Procedures: none   Follow-Up: At [facility], you and your health needs are our priority.  As part of our continuing mission to provide you with exceptional heart care, we have created designated Provider Care Teams.  These Care Teams include your primary Cardiologist (physician) and Advanced Practice Providers (APPs -  Physician Assistants and Nurse Practitioners) who all work together to provide you with the care you need, when you need it.  We recommend signing up for the patient portal called "MyChart".  Sign up information is provided on this After Visit Summary.  MyChart is used to connect with patients for Virtual Visits (Telemedicine).  Patients are able to view lab/test results, encounter notes, upcoming appointments, etc.  Non-urgent messages can be sent to your provider as well.   To learn more about what you can do with MyChart, go to [email or web address].    Your next appointment:   5 month(s)  The format for your next appointment:   In Person  Provider:   You may see Dorris Carnes, MD or one of the following Advanced Practice Providers on your designated Care Team:    Richardson Dopp, PA-C  Robbie Lis, [place]   Other Instructions

## 2019-07-23 ENCOUNTER — Ambulatory Visit (INDEPENDENT_AMBULATORY_CARE_PROVIDER_SITE_OTHER): Payer: Medicare HMO | Admitting: *Deleted

## 2019-07-23 DIAGNOSIS — I5022 Chronic systolic (congestive) heart failure: Secondary | ICD-10-CM | POA: Diagnosis not present

## 2019-07-23 DIAGNOSIS — I428 Other cardiomyopathies: Secondary | ICD-10-CM

## 2019-07-23 LAB — CUP PACEART REMOTE DEVICE CHECK
Battery Remaining Longevity: 79 mo
Battery Remaining Percentage: 95.5 %
Battery Voltage: 2.98 V
Brady Statistic AP VP Percent: 13 %
Brady Statistic AP VS Percent: 1 %
Brady Statistic AS VP Percent: 87 %
Brady Statistic AS VS Percent: 1 %
Brady Statistic RA Percent Paced: 12 %
Date Time Interrogation Session: 20210608020357
Implantable Lead Implant Date: 20081110
Implantable Lead Implant Date: 20081110
Implantable Lead Implant Date: 20081110
Implantable Lead Location: 753858
Implantable Lead Location: 753859
Implantable Lead Location: 753860
Implantable Pulse Generator Implant Date: 20201204
Lead Channel Impedance Value: 440 Ohm
Lead Channel Impedance Value: 460 Ohm
Lead Channel Impedance Value: 780 Ohm
Lead Channel Pacing Threshold Amplitude: 0.625 V
Lead Channel Pacing Threshold Amplitude: 1.25 V
Lead Channel Pacing Threshold Amplitude: 2 V
Lead Channel Pacing Threshold Pulse Width: 0.4 ms
Lead Channel Pacing Threshold Pulse Width: 0.4 ms
Lead Channel Pacing Threshold Pulse Width: 1 ms
Lead Channel Sensing Intrinsic Amplitude: 2.6 mV
Lead Channel Sensing Intrinsic Amplitude: 9 mV
Lead Channel Setting Pacing Amplitude: 1.625
Lead Channel Setting Pacing Amplitude: 2.25 V
Lead Channel Setting Pacing Amplitude: 2.5 V
Lead Channel Setting Pacing Pulse Width: 0.4 ms
Lead Channel Setting Pacing Pulse Width: 1.5 ms
Lead Channel Setting Sensing Sensitivity: 4 mV
Pulse Gen Model: 3222
Pulse Gen Serial Number: 9132853

## 2019-07-24 NOTE — Progress Notes (Signed)
Remote pacemaker transmission.   

## 2019-08-21 ENCOUNTER — Other Ambulatory Visit: Payer: Self-pay | Admitting: Internal Medicine

## 2019-08-21 NOTE — Telephone Encounter (Signed)
Eliquis 5mg  refill request received. Patient is 74 years old, weight-66.2kg, Crea-1.21 on 05/23/2019, Diagnosis-Afib, and last seen by Dr. Harrington Challenger on 06/13/2019. Dose is appropriate based on dosing criteria. Will send in refill to requested pharmacy.

## 2019-09-05 ENCOUNTER — Ambulatory Visit: Payer: Medicare HMO | Admitting: Neurology

## 2019-09-05 ENCOUNTER — Encounter: Payer: Self-pay | Admitting: Neurology

## 2019-09-05 VITALS — BP 111/73 | HR 69 | Ht 65.0 in | Wt 143.6 lb

## 2019-09-05 DIAGNOSIS — I639 Cerebral infarction, unspecified: Secondary | ICD-10-CM

## 2019-09-05 DIAGNOSIS — R413 Other amnesia: Secondary | ICD-10-CM | POA: Diagnosis not present

## 2019-09-05 NOTE — Progress Notes (Signed)
I have read the note, and I agree with the clinical assessment and plan.  Pepper Kerrick K Arissa Fagin   

## 2019-09-05 NOTE — Patient Instructions (Signed)
Continue current medications °See you back in 6 months  °

## 2019-09-05 NOTE — Progress Notes (Signed)
PATIENT: Kristin Gomez DOB: [birth year]  REASON FOR VISIT: follow up HISTORY FROM: patient  HISTORY OF PRESENT ILLNESS: Today 09/05/19  Kristin Gomez is a 74 year old female history of memory disorder.  She is on Namenda, cannot tolerate Aricept. Following a syncopal episode, CT scan in May 2020 showed a left cerebellar infarct, right occipital infarct, likely chronic but new since 2019.  She has no deficits.  She is unable to have MRI of the brain because she has a pacemaker.  She is taking Eliquis.  Aspirin was suggested, as result of her CT angio head and neck showing severe bilateral proximal PCA stenosis, but she has significant allergy.  Memory is overall stable, she and her daughter live together, Kristin [place].  She has her own ADLs.  Kristin Gomez manages her medications.  She does not drive.  No recent falls.  Seeing PCP tomorrow, having more bruising, notes diffuse knots under the skin on her arms, tender.  Presents today for follow-up accompanied by her daughter, Kristin [place].  HISTORY 03/07/2019 SS: Kristin Gomez is a 74 year old female with history of memory disorder.  She remains on Namenda, was unable to tolerate Aricept.  Following a syncopal episode, CT scan in May 2020 showed a left cerebellar infarct, right occipital infarct, likely chronic but new since 2019.  She has no deficits.  She is unable to have MRI of the brain because she has a pacemaker.  She is taking Eliquis.  Aspirin was suggested, as result of her CT angio head and neck showing severe bilateral proximal PCA stenosis, but she has significant allergy. Since last seen, she says she has been doing well.  She has good and bad days with her memory.  She lives with her daughter, Kristin Gomez.  She is able to do all of her own ADLs, but her daughter manages her medications.  She does laundry and housework, she does not drive a car.  Her balance is fairly stable, a few months ago, she did fall in the kitchen.  In December she had the battery in her  pacemaker changed.  She has routine follow-up with her primary doctor tomorrow, she had shingles over the summer.  She presents today for evaluation accompanied by her daughter.   REVIEW OF SYSTEMS: Out of a complete 14 system review of symptoms, the patient complains only of the following symptoms, and all other reviewed systems are negative.  Memory loss  ALLERGIES: Allergies  Allergen Reactions  . Aspirin Shortness Of Breath and Other (See Comments)    Patient states that this caused an asthma attack Patient states that is causes an asthma attack.  . Azithromycin Anaphylaxis  . Infliximab Hives    remicade  . Methotrexate Hives  . Mushroom Extract Complex Anaphylaxis, Shortness Of Breath and Swelling  . Nsaids Shortness Of Breath and Other (See Comments)    Asthma attack  . Aricept [Donepezil Hcl]     Diarrhea  . Avelox [Moxifloxacin Hcl In Nacl] Swelling    Site of swelling not recalled  . Diovan [Valsartan] Swelling    Site of swelling not recalled  . Amoxicillin Rash  . Atorvastatin Swelling and Other (See Comments)    Exacerbated Psoriatic arthritis   . Contrast Media [Iodinated Diagnostic Agents] Rash  . Latex Rash  . Metformin Rash  . Metformin And Related Rash    ??  . Penicillins Rash    Has patient had a PCN reaction causing immediate rash, facial/tongue/throat swelling, SOB or lightheadedness with hypotension: Yes Has  patient had a PCN reaction causing severe rash involving mucus membranes or skin necrosis: No Has patient had a PCN reaction that required hospitalization: No Has patient had a PCN reaction occurring within the last 10 years: No If all of the above answers are "NO", then may proceed with Cephalosporin use.   Marland Kitchen Percocet [Oxycodone-Acetaminophen] Nausea Only  . Prednisone Rash    Can only be tolerated with Benadryl  . Propofol Rash  . Sulfa Antibiotics Nausea Only and Rash  . Vicodin [Hydrocodone-Acetaminophen] Nausea Only    HOME  MEDICATIONS: Outpatient Medications Prior to Visit  Medication Sig Dispense Refill  . albuterol (PROVENTIL HFA;VENTOLIN HFA) 108 (90 Base) MCG/ACT inhaler Inhale 2 puffs into the lungs every 6 (six) hours as needed for wheezing or shortness of breath.     . carvedilol (COREG) 12.5 MG tablet Take 1 tablet (12.5 mg total) by mouth 2 (two) times daily. 180 tablet 3  . Cholecalciferol (VITAMIN D3) 50 MCG (2000 UT) capsule Take 2,000 Units by mouth daily.     . Cyanocobalamin (B-12 COMPLIANCE INJECTION) 1000 MCG/ML KIT Inject 1 mL into the muscle every 14 (fourteen) days.     . diphenhydramine-acetaminophen (TYLENOL PM) 25-500 MG TABS tablet Take 1-2 tablets by mouth at bedtime as needed (pain).    Marland Kitchen ELIQUIS 5 MG TABS tablet Take 1 tablet by mouth twice daily 60 tablet 8  . glipiZIDE (GLUCOTROL XL) 5 MG 24 hr tablet Take 5 mg by mouth at bedtime.    Marland Kitchen ipratropium-albuterol (DUONEB) 0.5-2.5 (3) MG/3ML SOLN Take 3 mLs by nebulization every 6 (six) hours as needed (for shortness of breath or wheezing).   2  . levothyroxine (EUTHYROX) 25 MCG tablet Take 25 mcg by mouth daily before breakfast.    . memantine (NAMENDA) 10 MG tablet Take 1 tablet (10 mg total) by mouth 2 (two) times daily. 180 tablet 3  . methylPREDNISolone (MEDROL) 4 MG tablet Take 4 mg by mouth as needed (Fibermyalgia).     . mupirocin ointment (BACTROBAN) 2 % Apply 2 g topically in the morning and at bedtime. For 7 days    . omeprazole (PRILOSEC) 20 MG capsule Take 20 mg by mouth daily as needed (acid reflux).     . potassium chloride SA (K-DUR,KLOR-CON) 20 MEQ tablet Take 20 mEq by mouth daily.    . rosuvastatin (CRESTOR) 20 MG tablet Take 1 tablet (20 mg total) by mouth daily. 90 tablet 3  . furosemide (LASIX) 20 MG tablet Take 1 tablet (20 mg total) by mouth daily. 90 tablet 3   No facility-administered medications prior to visit.    PAST MEDICAL HISTORY: Past Medical History:  Diagnosis Date  . Arthritis    "all over"  (09/26/2017)  . Asthma   . Diabetes mellitus   . Fibromyalgia   . Heart disease   . High cholesterol   . History of blood transfusion    "related to a surgery" (09/26/2017)  . Hypertension   . Memory difficulty 07/26/2017  . Myocardial infarction [facility])    "years ago" (09/26/2017)  . Pacemaker   . 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 Stroke [facility]) 02/2017   "affected my memory" (09/26/2017)  . Type II diabetes mellitus ([place])     PAST SURGICAL HISTORY: Past Surgical History:  Procedure Laterality Date  . CARDIAC CATHETERIZATION    . CATARACT EXTRACTION W/ INTRAOCULAR LENS  IMPLANT, BILATERAL Bilateral   . CHOLECYSTECTOMY    . COLONOSCOPY    .  I & D EXTREMITY  03/31/2011   Procedure: IRRIGATION AND DEBRIDEMENT EXTREMITY;  Surgeon: Tennis Must, MD;  Location: [place];  Service: Orthopedics;  Laterality: Left;  left long finger  . INSERT / REPLACE / REMOVE PACEMAKER    . KNEE ARTHROSCOPY Left   . MASS EXCISION  03/08/2011   Procedure: EXCISION MASS;  Surgeon: Tennis Must, MD;  Location: [place];  Service: Orthopedics;  Laterality: Left;  Excision Mass Left Long Finger and Debridement of Distal Interphalangeal  Joint  . PPM GENERATOR CHANGEOUT N/A 01/18/2019   Procedure: PPM GENERATOR CHANGEOUT;  Surgeon: Deboraha Sprang, MD;  Location: [place] CV LAB;  Service: Cardiovascular;  Laterality: N/A;  . TONSILLECTOMY    . TOTAL ABDOMINAL HYSTERECTOMY    . TUBAL LIGATION      FAMILY HISTORY: Family History  Problem Relation Age of Onset  . Alzheimer's disease Mother   . Heart attack Father   . Diabetes Brother     SOCIAL HISTORY: Social History   Socioeconomic History  . Marital status: Widowed    Spouse name: Not on file  . Number of children: 2  . Years of education: College  . Highest education level: Not on file  Occupational History  . Not on file  Tobacco Use  . Smoking status: Never Smoker  . Smokeless tobacco: Never  Used  Vaping Use  . Vaping Use: Never used  Substance and Sexual Activity  . Alcohol use: Never  . Drug use: Never  . Sexual activity: Not Currently  Other Topics Concern  . Not on file  Social History Narrative   Lives with daughter   Caffeine use: diet drinks   diet coke out to eat   Rig thhanded    Social Determinants of Health   Financial Resource Strain:   . Difficulty of Paying Living Expenses:   Food Insecurity:   . Worried About Charity fundraiser in the Last Year:   . Arboriculturist in the Last Year:   Transportation Needs:   . Film/video editor (Medical):   Marland Kitchen Lack of Transportation (Non-Medical):   Physical Activity:   . Days of Exercise per Week:   . Minutes of Exercise per Session:   Stress:   . Feeling of Stress :   Social Connections:   . Frequency of Communication with Friends and Family:   . Frequency of Social Gatherings with Friends and Family:   . Attends Religious Services:   . Active Member of Clubs or Organizations:   . Attends Archivist Meetings:   Marland Kitchen Marital Status:   Intimate Partner Violence:   . Fear of Current or Ex-Partner:   . Emotionally Abused:   Marland Kitchen Physically Abused:   . Sexually Abused:    PHYSICAL EXAM  Vitals:   09/05/19 1023  BP: 111/73  Pulse: 69  Weight: 143 lb 9.6 oz (65.1 kg)  Height: 5' 5"  (1.651 m)   Body mass index is 23.9 kg/m.  Generalized: Well developed, in no acute distress  MMSE - Mini Mental State Exam 03/07/2019 08/29/2018 02/28/2018  Not completed: (No Data) (No Data) (No Data)  Orientation to time 4 4 3   Orientation to Place 3 2 1   Registration 3 3 3   Attention/ Calculation 0 0 5  Recall 2 2 1   Language- name 2 objects 2 2 2   Language- repeat 1 1 1   Language- follow 3 step command 2 3  3  Language- follow 3 step command-comments she grabbed the paper with her left hand - She grabbed the paper with both hands  Language- read & follow direction 1 1 1   Write a sentence 1 1 1   Copy  design 1 1 1   Total score 20 20 22     Neurological examination  Mentation: Alert oriented to time, place, history taking. Follows all commands speech and language fluent Cranial nerve II-XII: Pupils were equal round reactive to light. Extraocular movements were full, visual field were full on confrontational test. Facial sensation and strength were normal. Head turning and shoulder shrug  were normal and symmetric. Motor: The motor testing reveals 5 over 5 strength of all 4 extremities. Good symmetric motor tone is noted throughout.  Sensory: Sensory testing is intact to soft touch on all 4 extremities. No evidence of extinction is noted.  Coordination: Cerebellar testing reveals good finger-nose-finger and heel-to-shin bilaterally.  Gait and station: Gait is slightly wide-based, but steady, no assistive device Reflexes: Deep tendon reflexes are symmetric and normal bilaterally.   DIAGNOSTIC DATA (LABS, IMAGING, TESTING) - I reviewed patient records, labs, notes, testing and imaging myself where available.  Lab Results  Component Value Date   WBC 7.7 05/09/2019   HGB 12.6 05/09/2019   HCT 36.6 05/09/2019   MCV 92 05/09/2019   PLT 280 05/09/2019      Component Value Date/Time   NA 146 (H) 05/23/2019 1351   K 3.7 05/23/2019 1351   CL 107 (H) 05/23/2019 1351   CO2 21 05/23/2019 1351   GLUCOSE 103 (H) 05/23/2019 1351   GLUCOSE 127 (H) 09/26/2017 0621   BUN 22 05/23/2019 1351   CREATININE 1.21 (H) 05/23/2019 1351   CALCIUM 9.0 05/23/2019 1351   PROT 5.6 (L) 11/28/2017 1148   ALBUMIN 3.6 11/28/2017 1148   AST 12 11/28/2017 1148   ALT 14 11/28/2017 1148   ALKPHOS 71 11/28/2017 1148   BILITOT 0.4 11/28/2017 1148   GFRNONAA 44 (L) 05/23/2019 1351   GFRAA 51 (L) 05/23/2019 1351   Lab Results  Component Value Date   CHOL 283 (H) 10/29/2018   HDL 52 10/29/2018   LDLCALC 187 (H) 10/29/2018   TRIG 231 (H) 10/29/2018   CHOLHDL 5.4 (H) 10/29/2018   No results found for: HGBA1C No  results found for: VITAMINB12 Lab Results  Component Value Date   TSH 4.410 11/28/2017    ASSESSMENT AND PLAN 74 y.o. 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 here with:  1.  Memory loss 2.  Syncopal event May 2020, CT scan of the brain shows left cerebellar infarct, right occipital infarct, new since February 2019 CT scan, cannot have MRI of the brain due to pacemaker  She is overall stable since last seen.  She will remain on Namenda, cannot tolerate Aricept.  She remains on Eliquis, with pacemaker.  She will continue follow-up with primary doctor, follow-up here in 6 months or sooner if needed.  I spent 30 minutes of face-to-face and non-face-to-face time with patient.  This included previsit chart review, lab review, study review, order entry, electronic health record documentation, patient education.  Butler Denmark, AGNP-C, DNP 09/05/2019, 10:38 AM [facility] [address] [place], [place] [address] [phone]

## 2019-10-22 ENCOUNTER — Ambulatory Visit (INDEPENDENT_AMBULATORY_CARE_PROVIDER_SITE_OTHER): Payer: Medicare HMO | Admitting: *Deleted

## 2019-10-22 DIAGNOSIS — I5022 Chronic systolic (congestive) heart failure: Secondary | ICD-10-CM | POA: Diagnosis not present

## 2019-10-22 LAB — CUP PACEART REMOTE DEVICE CHECK
Battery Remaining Longevity: 78 mo
Battery Remaining Percentage: 95.5 %
Battery Voltage: 2.98 V
Brady Statistic AP VP Percent: 17 %
Brady Statistic AP VS Percent: 1 %
Brady Statistic AS VP Percent: 83 %
Brady Statistic AS VS Percent: 1 %
Brady Statistic RA Percent Paced: 17 %
Date Time Interrogation Session: 20210907024028
Implantable Lead Implant Date: 20081110
Implantable Lead Implant Date: 20081110
Implantable Lead Implant Date: 20081110
Implantable Lead Location: 753858
Implantable Lead Location: 753859
Implantable Lead Location: 753860
Implantable Pulse Generator Implant Date: 20201204
Lead Channel Impedance Value: 440 Ohm
Lead Channel Impedance Value: 510 Ohm
Lead Channel Impedance Value: 740 Ohm
Lead Channel Pacing Threshold Amplitude: 0.625 V
Lead Channel Pacing Threshold Amplitude: 1.25 V
Lead Channel Pacing Threshold Amplitude: 2 V
Lead Channel Pacing Threshold Pulse Width: 0.4 ms
Lead Channel Pacing Threshold Pulse Width: 0.4 ms
Lead Channel Pacing Threshold Pulse Width: 1 ms
Lead Channel Sensing Intrinsic Amplitude: 12 mV
Lead Channel Sensing Intrinsic Amplitude: 3.1 mV
Lead Channel Setting Pacing Amplitude: 1.625
Lead Channel Setting Pacing Amplitude: 2.25 V
Lead Channel Setting Pacing Amplitude: 2.5 V
Lead Channel Setting Pacing Pulse Width: 0.4 ms
Lead Channel Setting Pacing Pulse Width: 1.5 ms
Lead Channel Setting Sensing Sensitivity: 4 mV
Pulse Gen Model: 3222
Pulse Gen Serial Number: 9132853

## 2019-10-24 NOTE — Progress Notes (Signed)
Remote pacemaker transmission.   

## 2019-11-05 ENCOUNTER — Other Ambulatory Visit: Payer: Self-pay | Admitting: Internal Medicine

## 2019-11-08 DIAGNOSIS — M1812 Unilateral primary osteoarthritis of first carpometacarpal joint, left hand: Secondary | ICD-10-CM | POA: Insufficient documentation

## 2019-12-22 ENCOUNTER — Other Ambulatory Visit: Payer: Self-pay | Admitting: Physician Assistant

## 2020-01-03 ENCOUNTER — Encounter: Payer: Self-pay | Admitting: Internal Medicine

## 2020-01-03 ENCOUNTER — Other Ambulatory Visit: Payer: Self-pay

## 2020-01-03 ENCOUNTER — Ambulatory Visit: Payer: Medicare HMO | Admitting: Internal Medicine

## 2020-01-03 VITALS — BP 118/74 | HR 74 | Ht 65.0 in | Wt 155.6 lb

## 2020-01-03 DIAGNOSIS — E782 Mixed hyperlipidemia: Secondary | ICD-10-CM

## 2020-01-03 DIAGNOSIS — I428 Other cardiomyopathies: Secondary | ICD-10-CM

## 2020-01-03 DIAGNOSIS — I5022 Chronic systolic (congestive) heart failure: Secondary | ICD-10-CM | POA: Diagnosis not present

## 2020-01-03 MED ORDER — FUROSEMIDE 20 MG PO TABS
20.0000 mg | ORAL_TABLET | Freq: Every day | ORAL | 3 refills | Status: DC
Start: 2020-01-03 — End: 2020-05-08

## 2020-01-03 NOTE — Patient Instructions (Signed)
Medication Instructions:  No changes *If you need a refill on your cardiac medications before your next appointment, please call your pharmacy*   Lab Work: Today: cbc, bmet, lipids If you have labs (blood work) drawn today and your tests are completely normal, you will receive your results only by: Marland Kitchen MyChart Message (if you have MyChart) OR . A paper copy in the mail If you have any lab test that is abnormal or we need to change your treatment, we will call you to review the results.   Testing/Procedures: none   Follow-Up: At [facility], you and your health needs are our priority.  As part of our continuing mission to provide you with exceptional heart care, we have created designated Provider Care Teams.  These Care Teams include your primary Cardiologist (physician) and Advanced Practice Providers (APPs -  Physician Assistants and Nurse Practitioners) who all work together to provide you with the care you need, when you need it.  We recommend signing up for the patient portal called "MyChart".  Sign up information is provided on this After Visit Summary.  MyChart is used to connect with patients for Virtual Visits (Telemedicine).  Patients are able to view lab/test results, encounter notes, upcoming appointments, etc.  Non-urgent messages can be sent to your provider as well.   To learn more about what you can do with MyChart, go to [email or web address].    Your next appointment:   8 month(s)  The format for your next appointment:   In Person  Provider:   You may see Dorris Carnes, MD or one of the following Advanced Practice Providers on your designated Care Team:    Richardson Dopp, PA-C  Robbie Lis, [place]    Other Instructions

## 2020-01-03 NOTE — Progress Notes (Signed)
Cardiology Office Note   Date:  01/03/2020   ID:  Kristin Gomez, DOB 08/26/1945, MRN [id number]  PCP:  Bonnita Nasuti, MD  Cardiologist:   Dorris Carnes, MD   F/U of HTN and chronic systolic CHF     History of Present Illness: Kristin Gomez is a 74 y.o. female with a history of HTN, Type II DM, HL, CHF and PPM  Echo in October 2019 LVEF was 35 to 40%     I saw the patient in April of this year.  In the interval she says she has done okay.  Her breathing is steady.  She denies chest tightness.  She denies PND.  Ankles pretty good.     Current Meds  Medication Sig  . albuterol (PROVENTIL HFA;VENTOLIN HFA) 108 (90 Base) MCG/ACT inhaler Inhale 2 puffs into the lungs every 6 (six) hours as needed for wheezing or shortness of breath.   . carvedilol (COREG) 12.5 MG tablet Take 1 tablet by mouth twice daily  . Cholecalciferol (VITAMIN D3) 50 MCG (2000 UT) capsule Take 2,000 Units by mouth daily.   . Cyanocobalamin (B-12 COMPLIANCE INJECTION) 1000 MCG/ML KIT Inject 1 mL into the muscle every 14 (fourteen) days.   . diphenhydramine-acetaminophen (TYLENOL PM) 25-500 MG TABS tablet Take 1-2 tablets by mouth at bedtime as needed (pain).  Marland Kitchen doxycycline (VIBRAMYCIN) 100 MG capsule Take 100 mg by mouth daily.  Marland Kitchen ELIQUIS 5 MG TABS tablet Take 1 tablet by mouth twice daily  . furosemide (LASIX) 20 MG tablet Take 1 tablet (20 mg total) by mouth daily.  Marland Kitchen glipiZIDE (GLUCOTROL XL) 5 MG 24 hr tablet Take 5 mg by mouth at bedtime.  Marland Kitchen ipratropium-albuterol (DUONEB) 0.5-2.5 (3) MG/3ML SOLN Take 3 mLs by nebulization every 6 (six) hours as needed (for shortness of breath or wheezing).   Marland Kitchen levothyroxine (EUTHYROX) 25 MCG tablet Take 25 mcg by mouth daily before breakfast.  . memantine (NAMENDA) 10 MG tablet Take 1 tablet (10 mg total) by mouth 2 (two) times daily.  . methylPREDNISolone (MEDROL) 4 MG tablet Take 4 mg by mouth as needed (Fibermyalgia).   . mupirocin ointment (BACTROBAN) 2 % Apply 2 g  topically in the morning and at bedtime. For 7 days  . omeprazole (PRILOSEC) 20 MG capsule Take 20 mg by mouth daily as needed (acid reflux).   . potassium chloride SA (K-DUR,KLOR-CON) 20 MEQ tablet Take 20 mEq by mouth daily.  . rosuvastatin (CRESTOR) 20 MG tablet Take 1 tablet by mouth once daily     Allergies:   Aspirin, Azithromycin, Infliximab, Methotrexate, Mushroom extract complex, Nsaids, Avelox [moxifloxacin hcl in nacl], Diovan [valsartan], Donepezil, Donepezil hcl, Moxifloxacin, Other, Oxycodone-acetaminophen, Amoxicillin, Atorvastatin, Contrast media [iodinated diagnostic agents], Iodine, Latex, Metformin, Metformin and related, Penicillins, Percocet [oxycodone-acetaminophen], Prednisone, Propofol, Sulfa antibiotics, and Vicodin [hydrocodone-acetaminophen]   Past Medical History:  Diagnosis Date  . Arthritis    "all over" (09/26/2017)  . Asthma   . Diabetes mellitus   . Fibromyalgia   . Heart disease   . High cholesterol   . History of blood transfusion    "related to a surgery" (09/26/2017)  . Hypertension   . Memory difficulty 07/26/2017  . Myocardial infarction [facility])    "years ago" (09/26/2017)  . Pacemaker   . 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 Stroke [facility]) 02/2017   "affected my memory" (09/26/2017)  . Type II diabetes mellitus ([place])     Past Surgical History:  Procedure Laterality  Date  . CARDIAC CATHETERIZATION    . CATARACT EXTRACTION W/ INTRAOCULAR LENS  IMPLANT, BILATERAL Bilateral   . CHOLECYSTECTOMY    . COLONOSCOPY    . I & D EXTREMITY  03/31/2011   Procedure: IRRIGATION AND DEBRIDEMENT EXTREMITY;  Surgeon: Tennis Must, MD;  Location: [place];  Service: Orthopedics;  Laterality: Left;  left long finger  . INSERT / REPLACE / REMOVE PACEMAKER    . KNEE ARTHROSCOPY Left   . MASS EXCISION  03/08/2011   Procedure: EXCISION MASS;  Surgeon: Tennis Must, MD;  Location: [place];  Service: Orthopedics;  Laterality:  Left;  Excision Mass Left Long Finger and Debridement of Distal Interphalangeal  Joint  . PPM GENERATOR CHANGEOUT N/A 01/18/2019   Procedure: PPM GENERATOR CHANGEOUT;  Surgeon: Deboraha Sprang, MD;  Location: [place] CV LAB;  Service: Cardiovascular;  Laterality: N/A;  . TONSILLECTOMY    . TOTAL ABDOMINAL HYSTERECTOMY    . TUBAL LIGATION       Social History:  The patient  reports that she has never smoked. She has never used smokeless tobacco. She reports that she does not drink alcohol and does not use drugs.   Family History:  The patient's family history includes Alzheimer's disease in her mother; Diabetes in her brother; Heart attack in her father.    ROS:  Please see the history of present illness. All other systems are reviewed and  Negative to the above problem except as noted.    PHYSICAL EXAM: VS:  BP 118/74   Pulse 74   Ht 5' 5"  (1.651 m)   Wt 155 lb 9.6 oz (70.6 kg)   SpO2 97%   BMI 25.89 kg/m   GEN: 74 yo  in no acute distress  HEENT: normal  Neck: JVP is normal Cardiac: Regular rate and rhythm no S3  No  LE  edema  Respiratory:  clear to auscultation bilaterally, normal work of breathing GI: soft nontender BS  No hepatomegaly  MS: no deformity Moving all extremities   Skin: Bruises.  Skin is dry.    EKG EKG is ordered today .  Possible sinus rhythm P waves difficult to see in some leads.  Ventricular paced   Lipid Panel    Component Value Date/Time   CHOL 283 (H) 10/29/2018 1537   TRIG 231 (H) 10/29/2018 1537   HDL 52 10/29/2018 1537   CHOLHDL 5.4 (H) 10/29/2018 1537   LDLCALC 187 (H) 10/29/2018 1537      Wt Readings from Last 3 Encounters:  01/03/20 155 lb 9.6 oz (70.6 kg)  09/05/19 143 lb 9.6 oz (65.1 kg)  06/13/19 146 lb (66.2 kg)      ASSESSMENT AND PLAN:  1  Chronic diastolic/ systolic  CHF. last echo LVEF was 40 to 45%.  Volume status looks pretty good.  I would check labs today.  Note she is allergic to valsartan.  She is not on an  ARB or ACE inhibitor.  She also has not on hydralazine/nitrate.  Will review her labs for now I would keep the same. 2  Atrial fibrillation   keep on Eliquis. 3  PPM  Follows with S Klein  Appt in December    4  HL    patient on Crestor.  Will check lipids. 5  HTN  Good control    Encouraged her to watch salt intake over the holidays. Plan for f/u in July 2022.   Current medicines  are reviewed at length with the patient today.  The patient does not have concerns regarding medicines.  Signed, Dorris Carnes, MD  01/03/2020 2:01 PM    [facility] [place], [place], [place]  [address] Phone: [phone]; Fax: [phone]

## 2020-01-04 LAB — CBC
Hematocrit: 41.9 % (ref 34.0–46.6)
Hemoglobin: 14.4 g/dL (ref 11.1–15.9)
MCH: 33 pg (ref 26.6–33.0)
MCHC: 34.4 g/dL (ref 31.5–35.7)
MCV: 96 fL (ref 79–97)
Platelets: 183 10*3/uL (ref 150–450)
RBC: 4.37 x10E6/uL (ref 3.77–5.28)
RDW: 12.8 % (ref 11.7–15.4)
WBC: 8.2 10*3/uL (ref 3.4–10.8)

## 2020-01-04 LAB — LIPID PANEL
Chol/HDL Ratio: 2.5 ratio (ref 0.0–4.4)
Cholesterol, Total: 152 mg/dL (ref 100–199)
HDL: 62 mg/dL (ref >39)
LDL Chol Calc (NIH): 65 mg/dL (ref 0–99)
Triglycerides: 147 mg/dL (ref 0–149)
VLDL Cholesterol Cal: 25 mg/dL (ref 5–40)

## 2020-01-04 LAB — BASIC METABOLIC PANEL
BUN/Creatinine Ratio: 19 (ref 12–28)
BUN: 20 mg/dL (ref 8–27)
CO2: 23 mmol/L (ref 20–29)
Calcium: 8.9 mg/dL (ref 8.7–10.3)
Chloride: 109 mmol/L — ABNORMAL HIGH (ref 96–106)
Creatinine, Ser: 1.03 mg/dL — ABNORMAL HIGH (ref 0.57–1.00)
GFR calc Af Amer: 62 mL/min/{1.73_m2} (ref >59)
GFR calc non Af Amer: 54 mL/min/{1.73_m2} — ABNORMAL LOW (ref >59)
Glucose: 136 mg/dL — ABNORMAL HIGH (ref 65–99)
Potassium: 4.2 mmol/L (ref 3.5–5.2)
Sodium: 147 mmol/L — ABNORMAL HIGH (ref 134–144)

## 2020-01-21 ENCOUNTER — Ambulatory Visit (INDEPENDENT_AMBULATORY_CARE_PROVIDER_SITE_OTHER): Payer: Medicare HMO

## 2020-01-21 DIAGNOSIS — I5022 Chronic systolic (congestive) heart failure: Secondary | ICD-10-CM | POA: Diagnosis not present

## 2020-01-21 LAB — CUP PACEART REMOTE DEVICE CHECK
Battery Remaining Longevity: 82 mo
Battery Remaining Percentage: 95.5 %
Battery Voltage: 2.98 V
Brady Statistic AP VP Percent: 16 %
Brady Statistic AP VS Percent: 1 %
Brady Statistic AS VP Percent: 83 %
Brady Statistic AS VS Percent: 1 %
Brady Statistic RA Percent Paced: 16 %
Date Time Interrogation Session: 20211207020013
Implantable Lead Implant Date: 20081110
Implantable Lead Implant Date: 20081110
Implantable Lead Implant Date: 20081110
Implantable Lead Location: 753858
Implantable Lead Location: 753859
Implantable Lead Location: 753860
Implantable Pulse Generator Implant Date: 20201204
Lead Channel Impedance Value: 460 Ohm
Lead Channel Impedance Value: 510 Ohm
Lead Channel Impedance Value: 840 Ohm
Lead Channel Pacing Threshold Amplitude: 0.625 V
Lead Channel Pacing Threshold Amplitude: 1.125 V
Lead Channel Pacing Threshold Amplitude: 2 V
Lead Channel Pacing Threshold Pulse Width: 0.4 ms
Lead Channel Pacing Threshold Pulse Width: 0.4 ms
Lead Channel Pacing Threshold Pulse Width: 1 ms
Lead Channel Sensing Intrinsic Amplitude: 12 mV
Lead Channel Sensing Intrinsic Amplitude: 3.8 mV
Lead Channel Setting Pacing Amplitude: 1.625
Lead Channel Setting Pacing Amplitude: 2.125
Lead Channel Setting Pacing Amplitude: 2.5 V
Lead Channel Setting Pacing Pulse Width: 0.4 ms
Lead Channel Setting Pacing Pulse Width: 1.5 ms
Lead Channel Setting Sensing Sensitivity: 4 mV
Pulse Gen Model: 3222
Pulse Gen Serial Number: 9132853

## 2020-02-03 NOTE — Progress Notes (Signed)
Remote pacemaker transmission.   

## 2020-02-28 ENCOUNTER — Ambulatory Visit: Payer: Medicare HMO | Admitting: Internal Medicine

## 2020-02-28 ENCOUNTER — Other Ambulatory Visit: Payer: Self-pay

## 2020-02-28 ENCOUNTER — Encounter: Payer: Self-pay | Admitting: Internal Medicine

## 2020-02-28 VITALS — BP 102/64 | HR 85 | Ht 65.0 in | Wt 157.0 lb

## 2020-02-28 DIAGNOSIS — Z950 Presence of cardiac pacemaker: Secondary | ICD-10-CM | POA: Diagnosis not present

## 2020-02-28 DIAGNOSIS — I480 Paroxysmal atrial fibrillation: Secondary | ICD-10-CM

## 2020-02-28 DIAGNOSIS — I509 Heart failure, unspecified: Secondary | ICD-10-CM | POA: Diagnosis not present

## 2020-02-28 DIAGNOSIS — I428 Other cardiomyopathies: Secondary | ICD-10-CM | POA: Diagnosis not present

## 2020-02-28 NOTE — Patient Instructions (Signed)

## 2020-02-28 NOTE — Progress Notes (Signed)
Patient Care Team: Hague, Rosalyn Charters, MD as PCP - General (Internal Medicine) Fay Records, MD as PCP - Cardiology (Cardiology)   HPI  Kristin Gomez is a 75 y.o. female Seen in follow-up for what is best as we can determine was a CRT-P implantation 2008  by Dr. Minna Merritts at [facility].  She underwent generator replacement in 2/17 and then again she underwent generator replacement 12/20.  She has a history of atrial fibrillation and is anticoagulated w Apixoban   DOE chronic elevation or walking around the house.  No nocturnal or recumbent dyspnea.  Some peripheral edema.  Progressive dementia, not thought likely to remember that she was here tomorrow      DATE TEST EF   5/19 Echo 60-65%   5/20 Echo  45-50%   9/20 Echo  50-55% E/E' 51   Date Cr K Hgb  10/19 1.12 4.5 13.9 (8/19)  12/20 1.14 4.6 13.2  11/21 1.03 4.2 28.3   Thromboembolic risk factors ( age -58, HTN-1, TIA/CVA-2, DM-1, CHF-1, Gender-1) for a CHADSVASc Score of =>7 she is anticoagulated with apixaban.     Records and Results Reviewed   Past Medical History:  Diagnosis Date  . Arthritis    "all over" (09/26/2017)  . Asthma   . Diabetes mellitus   . Fibromyalgia   . Heart disease   . High cholesterol   . History of blood transfusion    "related to a surgery" (09/26/2017)  . Hypertension   . Memory difficulty 07/26/2017  . Myocardial infarction [facility])    "years ago" (09/26/2017)  . Pacemaker   . 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 Stroke [facility]) 02/2017   "affected my memory" (09/26/2017)  . Type II diabetes mellitus ([place])     Past Surgical History:  Procedure Laterality Date  . CARDIAC CATHETERIZATION    . CATARACT EXTRACTION W/ INTRAOCULAR LENS  IMPLANT, BILATERAL Bilateral   . CHOLECYSTECTOMY    . COLONOSCOPY    . I & D EXTREMITY  03/31/2011   Procedure: IRRIGATION AND DEBRIDEMENT EXTREMITY;  Surgeon: Tennis Must, MD;  Location: [place];  Service:  Orthopedics;  Laterality: Left;  left long finger  . INSERT / REPLACE / REMOVE PACEMAKER    . KNEE ARTHROSCOPY Left   . MASS EXCISION  03/08/2011   Procedure: EXCISION MASS;  Surgeon: Tennis Must, MD;  Location: [place];  Service: Orthopedics;  Laterality: Left;  Excision Mass Left Long Finger and Debridement of Distal Interphalangeal  Joint  . PPM GENERATOR CHANGEOUT N/A 01/18/2019   Procedure: PPM GENERATOR CHANGEOUT;  Surgeon: Deboraha Sprang, MD;  Location: [facility];  Service: Cardiovascular;  Laterality: N/A;  . TONSILLECTOMY    . TOTAL ABDOMINAL HYSTERECTOMY    . TUBAL LIGATION      Current Meds  Medication Sig  . albuterol (PROVENTIL HFA;VENTOLIN HFA) 108 (90 Base) MCG/ACT inhaler Inhale 2 puffs into the lungs every 6 (six) hours as needed for wheezing or shortness of breath.   . carvedilol (COREG) 12.5 MG tablet Take 1 tablet by mouth twice daily  . Cholecalciferol (VITAMIN D3) 50 MCG (2000 UT) capsule Take 2,000 Units by mouth daily.   . Cyanocobalamin 1000 MCG/ML KIT Inject 1 mL into the muscle every 14 (fourteen) days.   . diphenhydramine-acetaminophen (TYLENOL PM) 25-500 MG TABS tablet Take 1-2 tablets by mouth at bedtime as needed (pain).  Marland Kitchen doxycycline (VIBRAMYCIN)  100 MG capsule Take 100 mg by mouth daily.  Marland Kitchen ELIQUIS 5 MG TABS tablet Take 1 tablet by mouth twice daily  . furosemide (LASIX) 20 MG tablet Take 1 tablet (20 mg total) by mouth daily.  Marland Kitchen glipiZIDE (GLUCOTROL XL) 5 MG 24 hr tablet Take 5 mg by mouth at bedtime.  Marland Kitchen ipratropium-albuterol (DUONEB) 0.5-2.5 (3) MG/3ML SOLN Take 3 mLs by nebulization every 6 (six) hours as needed (for shortness of breath or wheezing).   Marland Kitchen levothyroxine (SYNTHROID) 25 MCG tablet Take 25 mcg by mouth daily before breakfast.  . memantine (NAMENDA) 10 MG tablet Take 1 tablet (10 mg total) by mouth 2 (two) times daily.  . methylPREDNISolone (MEDROL) 4 MG tablet Take 4 mg by mouth as needed (Fibermyalgia).   .  mupirocin ointment (BACTROBAN) 2 % Apply 2 g topically in the morning and at bedtime. For 7 days  . omeprazole (PRILOSEC) 20 MG capsule Take 20 mg by mouth daily as needed (acid reflux).   . potassium chloride SA (K-DUR,KLOR-CON) 20 MEQ tablet Take 20 mEq by mouth daily.  . pregabalin (LYRICA) 50 MG capsule Take 1 capsule by mouth daily.  . rosuvastatin (CRESTOR) 20 MG tablet Take 1 tablet by mouth once daily    Allergies  Allergen Reactions  . Aspirin Shortness Of Breath and Other (See Comments)    Patient states that this caused an asthma attack Patient states that is causes an asthma attack.  . Azithromycin Anaphylaxis  . Infliximab Hives    remicade  . Methotrexate Hives  . Mushroom Extract Complex Anaphylaxis, Shortness Of Breath and Swelling  . Nsaids Shortness Of Breath and Other (See Comments)    Asthma attack Asthma attack  . Avelox [Moxifloxacin Hcl In Nacl] Swelling    Site of swelling not recalled  . Diovan [Valsartan] Swelling    Site of swelling not recalled  . Donepezil Diarrhea  . Donepezil Hcl Other (See Comments)    Diarrhea Diarrhea  . Moxifloxacin Swelling  . Other Swelling    Site of swelling not recalled  . Oxycodone-Acetaminophen Nausea Only  . Amoxicillin Rash  . Atorvastatin Swelling and Other (See Comments)    Exacerbated Psoriatic arthritis   . Contrast Media [Iodinated Diagnostic Agents] Rash  . Iodine Rash  . Latex Rash  . Metformin Rash    ??  . Metformin And Related Rash    ??  . Penicillins Rash    Has patient had a PCN reaction causing immediate rash, facial/tongue/throat swelling, SOB or lightheadedness with hypotension: Yes Has patient had a PCN reaction causing severe rash involving mucus membranes or skin necrosis: No Has patient had a PCN reaction that required hospitalization: No Has patient had a PCN reaction occurring within the last 10 years: No If all of the above answers are "NO", then may proceed with Cephalosporin use.   Marland Kitchen  Percocet [Oxycodone-Acetaminophen] Nausea Only  . Prednisone Rash and Other (See Comments)    Can only be tolerated with Benadryl HAS TO TAKE BENADRYL WITH PREDNISONE  . Propofol Rash  . Sulfa Antibiotics Nausea Only and Rash  . Vicodin [Hydrocodone-Acetaminophen] Nausea Only      Review of Systems negative except from HPI and PMH  Physical Exam BP 102/64   Pulse 85   Ht 5' 5"  (1.651 m)   Wt 157 lb (71.2 kg)   SpO2 97%   BMI 26.13 kg/m  Well developed and well nourished in no acute distress HENT normal Neck supple with JVP  7 cm Clear Device pocket well healed; without hematoma or erythema.  There is no tethering  Regular rate and rhythm, 2/6 murmur Abd-soft with active BS No Clubbing cyanosis  edema Skin-warm and dry A & Oriented  Grossly normal sensory and motor function  ECG sinus with P synchronous pacing with a QRS duration of approximately 120 ms  Assessment and  Plan  NICM  CRT-P  HFpEF   LV threshold elevated   Afib RVR  infreq  Rheumatoid arthritis  CVA-recurrent    LV function has been stable in the past.  We will continue her carvedilol.  She has exertional dyspnea, however, not significantly volume overloaded.  Moreover, is having probable orthostatic intolerance and I worry more about the risk of falling and unfortunately having to deal with her mild dyspnea so will not increase meds  Attempted to improve resynchronization, was not able to do so.  Whether RV was early by 8 0 or LV was early by 80 ms the QRS morphologies were essentially identical.  No intercurrent atrial fibrillation or flutter  On Anticoagulation;  No bleeding issues

## 2020-03-04 ENCOUNTER — Other Ambulatory Visit: Payer: Self-pay | Admitting: Neurology

## 2020-03-06 LAB — CUP PACEART INCLINIC DEVICE CHECK
Battery Remaining Longevity: 76 mo
Battery Voltage: 2.98 V
Brady Statistic RA Percent Paced: 15 %
Brady Statistic RV Percent Paced: 99.76 %
Date Time Interrogation Session: 20220114151400
Implantable Lead Implant Date: 20081110
Implantable Lead Implant Date: 20081110
Implantable Lead Implant Date: 20081110
Implantable Lead Location: 753858
Implantable Lead Location: 753859
Implantable Lead Location: 753860
Implantable Pulse Generator Implant Date: 20201204
Lead Channel Impedance Value: 537.5 Ohm
Lead Channel Impedance Value: 587.5 Ohm
Lead Channel Impedance Value: 812.5 Ohm
Lead Channel Pacing Threshold Amplitude: 0.625 V
Lead Channel Pacing Threshold Amplitude: 1.125 V
Lead Channel Pacing Threshold Amplitude: 2.25 V
Lead Channel Pacing Threshold Amplitude: 2.25 V
Lead Channel Pacing Threshold Pulse Width: 0.4 ms
Lead Channel Pacing Threshold Pulse Width: 0.4 ms
Lead Channel Pacing Threshold Pulse Width: 1 ms
Lead Channel Pacing Threshold Pulse Width: 1 ms
Lead Channel Sensing Intrinsic Amplitude: 12 mV
Lead Channel Sensing Intrinsic Amplitude: 4.9 mV
Lead Channel Setting Pacing Amplitude: 1.625
Lead Channel Setting Pacing Amplitude: 2.125
Lead Channel Setting Pacing Amplitude: 2.5 V
Lead Channel Setting Pacing Pulse Width: 0.4 ms
Lead Channel Setting Pacing Pulse Width: 1.5 ms
Lead Channel Setting Sensing Sensitivity: 4 mV
Pulse Gen Model: 3222
Pulse Gen Serial Number: 9132853

## 2020-03-12 ENCOUNTER — Ambulatory Visit: Payer: Medicare HMO | Admitting: Neurology

## 2020-03-12 ENCOUNTER — Encounter: Payer: Self-pay | Admitting: Neurology

## 2020-03-12 VITALS — BP 145/92 | HR 76 | Ht 64.0 in | Wt 156.0 lb

## 2020-03-12 DIAGNOSIS — R413 Other amnesia: Secondary | ICD-10-CM | POA: Diagnosis not present

## 2020-03-12 MED ORDER — MEMANTINE HCL 10 MG PO TABS
10.0000 mg | ORAL_TABLET | Freq: Two times a day (BID) | ORAL | 1 refills | Status: DC
Start: 2020-03-12 — End: 2020-12-16

## 2020-03-12 NOTE — Progress Notes (Signed)
PATIENT: Kristin Gomez DOB: August 22, 1945  REASON FOR VISIT: follow up HISTORY FROM: patient  HISTORY OF PRESENT ILLNESS: Today 03/12/20 Kristin Gomez is a 75 year old female with history of memory disorder.  Is on Namenda, cannot tolerate Aricept.  May 2020, CT scan has shown left cerebellar infarct, right occipital infarct, unable to have MRI due to pacemaker.  Is on Eliquis.  She has significant allergy to aspirin.  Memory seems overall stable.  Her daughter lives with her.  She does not drive.  Her daughter keeps up with her medications.  She does her own ADLs, does some housework.  She has a good mood.  No falls.  Has routine follow-up with PCP every 3 months. Knots under the skin felt related to arthritis, are improving.  She has no complaints today.  Accompanied by her daughter Kristin Gomez.  MMSE 18/30 today.  She turns 62 yesterday.  HISTORY 09/05/2019 SS: Kristin Gomez is a 75 year old female history of memory disorder.  She is on Namenda, cannot tolerate Aricept. Following a syncopal episode,CT scan in May 2020 showed a left cerebellar infarct, right occipital infarct, likely chronic but new since 2019.She has no deficits. She is unable to have MRI of the brain because she has a pacemaker. She is taking Eliquis. Aspirin was suggested, as result of her CT angio head and neck showing severe bilateral proximal PCAstenosis,but she has significant allergy.  Memory is overall stable, she and her daughter live together, Kristin Gomez.  She has her own ADLs.  Kristin Gomez manages her medications.  She does not drive.  No recent falls.  Seeing PCP tomorrow, having more bruising, notes diffuse knots under the skin on her arms, tender.  Presents today for follow-up accompanied by her daughter, Kristin Gomez.   REVIEW OF SYSTEMS: Out of a complete 14 system review of symptoms, the patient complains only of the following symptoms, and all other reviewed systems are negative.  Memory  disturbance  ALLERGIES: Allergies  Allergen Reactions  . Aspirin Shortness Of Breath and Other (See Comments)    Patient states that this caused an asthma attack Patient states that is causes an asthma attack.  . Azithromycin Anaphylaxis  . Infliximab Hives    remicade  . Methotrexate Hives  . Mushroom Extract Complex Anaphylaxis, Shortness Of Breath and Swelling  . Nsaids Shortness Of Breath and Other (See Comments)    Asthma attack Asthma attack  . Avelox [Moxifloxacin Hcl In Nacl] Swelling    Site of swelling not recalled  . Diovan [Valsartan] Swelling    Site of swelling not recalled  . Donepezil Diarrhea  . Donepezil Hcl Other (See Comments)    Diarrhea Diarrhea  . Moxifloxacin Swelling  . Other Swelling    Site of swelling not recalled  . Oxycodone-Acetaminophen Nausea Only  . Amoxicillin Rash  . Atorvastatin Swelling and Other (See Comments)    Exacerbated Psoriatic arthritis   . Contrast Media [Iodinated Diagnostic Agents] Rash  . Iodine Rash  . Latex Rash  . Metformin Rash    ??  . Metformin And Related Rash    ??  . Penicillins Rash    Has patient had a PCN reaction causing immediate rash, facial/tongue/throat swelling, SOB or lightheadedness with hypotension: Yes Has patient had a PCN reaction causing severe rash involving mucus membranes or skin necrosis: No Has patient had a PCN reaction that required hospitalization: No Has patient had a PCN reaction occurring within the last 10 years: No If all of the above answers  are "NO", then may proceed with Cephalosporin use.   Marland Kitchen Percocet [Oxycodone-Acetaminophen] Nausea Only  . Prednisone Rash and Other (See Comments)    Can only be tolerated with Benadryl HAS TO TAKE BENADRYL WITH PREDNISONE  . Propofol Rash  . Sulfa Antibiotics Nausea Only and Rash  . Vicodin [Hydrocodone-Acetaminophen] Nausea Only    HOME MEDICATIONS: Outpatient Medications Prior to Visit  Medication Sig Dispense Refill  . albuterol  (PROVENTIL HFA;VENTOLIN HFA) 108 (90 Base) MCG/ACT inhaler Inhale 2 puffs into the lungs every 6 (six) hours as needed for wheezing or shortness of breath.     . carvedilol (COREG) 12.5 MG tablet Take 1 tablet by mouth twice daily 180 tablet 1  . Cholecalciferol (VITAMIN D3) 50 MCG (2000 UT) capsule Take 2,000 Units by mouth daily.     . diphenhydramine-acetaminophen (TYLENOL PM) 25-500 MG TABS tablet Take 1-2 tablets by mouth at bedtime as needed (pain).    Marland Kitchen doxycycline (VIBRAMYCIN) 100 MG capsule Take 100 mg by mouth daily.    Marland Kitchen ELIQUIS 5 MG TABS tablet Take 1 tablet by mouth twice daily 60 tablet 8  . furosemide (LASIX) 20 MG tablet Take 1 tablet (20 mg total) by mouth daily. 90 tablet 3  . glipiZIDE (GLUCOTROL XL) 5 MG 24 hr tablet Take 5 mg by mouth at bedtime.    Marland Kitchen ipratropium-albuterol (DUONEB) 0.5-2.5 (3) MG/3ML SOLN Take 3 mLs by nebulization every 6 (six) hours as needed (for shortness of breath or wheezing).   2  . levothyroxine (SYNTHROID) 25 MCG tablet Take 25 mcg by mouth daily before breakfast.    . lisinopril (ZESTRIL) 5 MG tablet Take 1 tablet by mouth daily.    . memantine (NAMENDA) 10 MG tablet Take 1 tablet by mouth twice daily 180 tablet 0  . methylPREDNISolone (MEDROL) 4 MG tablet Take 4 mg by mouth as needed (Fibermyalgia).     Marland Kitchen omeprazole (PRILOSEC) 20 MG capsule Take 20 mg by mouth daily as needed (acid reflux).     . potassium chloride SA (K-DUR,KLOR-CON) 20 MEQ tablet Take 20 mEq by mouth daily.    . rosuvastatin (CRESTOR) 20 MG tablet Take 1 tablet by mouth once daily 90 tablet 1  . pregabalin (LYRICA) 50 MG capsule Take 1 capsule by mouth daily.    . Cyanocobalamin 1000 MCG/ML KIT Inject 1 mL into the muscle every 14 (fourteen) days.     . mupirocin ointment (BACTROBAN) 2 % Apply 2 g topically in the morning and at bedtime. For 7 days     No facility-administered medications prior to visit.    PAST MEDICAL HISTORY: Past Medical History:  Diagnosis Date  .  Arthritis    "all over" (09/26/2017)  . Asthma   . Diabetes mellitus   . Fibromyalgia   . Heart disease   . High cholesterol   . History of blood transfusion    "related to a surgery" (09/26/2017)  . Hypertension   . Memory difficulty 07/26/2017  . Myocardial infarction [facility])    "years ago" (09/26/2017)  . Pacemaker   . 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 Stroke [facility]) 02/2017   "affected my memory" (09/26/2017)  . Type II diabetes mellitus ([place])     PAST SURGICAL HISTORY: Past Surgical History:  Procedure Laterality Date  . CARDIAC CATHETERIZATION    . CATARACT EXTRACTION W/ INTRAOCULAR LENS  IMPLANT, BILATERAL Bilateral   . CHOLECYSTECTOMY    . COLONOSCOPY    . I & D  EXTREMITY  03/31/2011   Procedure: IRRIGATION AND DEBRIDEMENT EXTREMITY;  Surgeon: Tennis Must, MD;  Location: [place];  Service: Orthopedics;  Laterality: Left;  left long finger  . INSERT / REPLACE / REMOVE PACEMAKER    . KNEE ARTHROSCOPY Left   . MASS EXCISION  03/08/2011   Procedure: EXCISION MASS;  Surgeon: Tennis Must, MD;  Location: [place];  Service: Orthopedics;  Laterality: Left;  Excision Mass Left Long Finger and Debridement of Distal Interphalangeal  Joint  . PPM GENERATOR CHANGEOUT N/A 01/18/2019   Procedure: PPM GENERATOR CHANGEOUT;  Surgeon: Deboraha Sprang, MD;  Location: [place] [facility];  Service: Cardiovascular;  Laterality: N/A;  . TONSILLECTOMY    . TOTAL ABDOMINAL HYSTERECTOMY    . TUBAL LIGATION      FAMILY HISTORY: Family History  Problem Relation Age of Onset  . Alzheimer's disease Mother   . Heart attack Father   . Diabetes Brother     SOCIAL HISTORY: Social History   Socioeconomic History  . Marital status: Widowed    Spouse name: Not on file  . Number of children: 2  . Years of education: College  . Highest education level: Not on file  Occupational History  . Not on file  Tobacco Use  . Smoking status: Never Smoker  .  Smokeless tobacco: Never Used  Vaping Use  . Vaping Use: Never used  Substance and Sexual Activity  . Alcohol use: Never  . Drug use: Never  . Sexual activity: Not Currently  Other Topics Concern  . Not on file  Social History Narrative   Lives with daughter   Caffeine use: diet drinks   diet coke out to eat   Rig thhanded    Social Determinants of Health   Financial Resource Strain: Not on file  Food Insecurity: Not on file  Transportation Needs: Not on file  Physical Activity: Not on file  Stress: Not on file  Social Connections: Not on file  Intimate Partner Violence: Not on file   PHYSICAL EXAM  Vitals:   03/12/20 1059  BP: (!) 145/92  Pulse: 76  Weight: 156 lb (70.8 kg)  Height: 5' 4"  (1.626 m)   Body mass index is 26.78 kg/m.  Generalized: Well developed, in no acute distress, well-appearing MMSE - Mini Mental State Exam 03/12/2020 03/07/2019 08/29/2018  Not completed: - (No Data) (No Data)  Orientation to time 2 4 4   Orientation to Place 3 3 2   Registration 3 3 3   Attention/ Calculation 0 0 0  Recall 2 2 2   Language- name 2 objects 2 2 2   Language- repeat 0 1 1  Language- follow 3 step command 3 2 3   Language- follow 3 step command-comments - she grabbed the paper with her left hand -  Language- read & follow direction 1 1 1   Write a sentence 1 1 1   Copy design 1 1 1   Copy design-comments 6 animals - -  Total score 18 20 20     Neurological examination  Mentation: Alert oriented to time, place, history taking. Follows all commands speech and language fluent Cranial nerve II-XII: Pupils were equal round reactive to light. Extraocular movements were full, visual field were full on confrontational test. Facial sensation and strength were normal. Head turning and shoulder shrug  were normal and symmetric. Motor: The motor testing reveals 5 over 5 strength of all 4 extremities. Good symmetric motor tone is noted throughout.  Sensory:  Sensory testing is intact  to soft touch on all 4 extremities. No evidence of extinction is noted.  Coordination: Cerebellar testing reveals good finger-nose-finger and heel-to-shin bilaterally.  Gait and station: Gait is slightly wide-based, but steady, no assistive device Reflexes: Deep tendon reflexes are symmetric and normal bilaterally.   DIAGNOSTIC DATA (LABS, IMAGING, TESTING) - I reviewed patient records, labs, notes, testing and imaging myself where available.  Lab Results  Component Value Date   WBC 8.2 01/03/2020   HGB 14.4 01/03/2020   HCT 41.9 01/03/2020   MCV 96 01/03/2020   PLT 183 01/03/2020      Component Value Date/Time   NA 147 (H) 01/03/2020 1416   K 4.2 01/03/2020 1416   CL 109 (H) 01/03/2020 1416   CO2 23 01/03/2020 1416   GLUCOSE 136 (H) 01/03/2020 1416   GLUCOSE 127 (H) 09/26/2017 0621   BUN 20 01/03/2020 1416   CREATININE 1.03 (H) 01/03/2020 1416   CALCIUM 8.9 01/03/2020 1416   PROT 5.6 (L) 11/28/2017 1148   ALBUMIN 3.6 11/28/2017 1148   AST 12 11/28/2017 1148   ALT 14 11/28/2017 1148   ALKPHOS 71 11/28/2017 1148   BILITOT 0.4 11/28/2017 1148   GFRNONAA 54 (L) 01/03/2020 1416   GFRAA 62 01/03/2020 1416   Lab Results  Component Value Date   CHOL 152 01/03/2020   HDL 62 01/03/2020   LDLCALC 65 01/03/2020   TRIG 147 01/03/2020   CHOLHDL 2.5 01/03/2020   No results found for: HGBA1C No results found for: VITAMINB12 Lab Results  Component Value Date   TSH 4.410 11/28/2017   ASSESSMENT AND PLAN 75 y.o. 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 here with:  1.  Memory loss 2. Syncopal event May 2020,CT scan of the brain shows left cerebellar infarct, right occipital  infarct, new since February 2019 CT scan, cannot have MRI of the brain due to pacemaker  She remains overall stable since last seen.  She will remain on Namenda, cannot tolerate Aricept.  She is on Eliquis, has a pacemaker.  She will continue routine follow-up with her primary doctor for stroke prevention/management of vascular risk factors, can follow-up in our office on an as-needed basis.  I spent 20 minutes of face-to-face and non-face-to-face time with patient.  This included previsit chart review, lab review, study review, order entry, electronic health record documentation, patient education.  Butler Denmark, AGNP-C, DNP 03/12/2020, 11:09 AM [facility] [address] [place], [place] [address] [phone]

## 2020-03-12 NOTE — Patient Instructions (Signed)
Continue Namenda at current dose Continue to see your primary doctor  Return as needed

## 2020-03-12 NOTE — Progress Notes (Signed)
I have read the note, and I agree with the clinical assessment and plan.  Charles K Willis   

## 2020-04-21 ENCOUNTER — Ambulatory Visit (INDEPENDENT_AMBULATORY_CARE_PROVIDER_SITE_OTHER): Payer: Medicare HMO

## 2020-04-21 DIAGNOSIS — I480 Paroxysmal atrial fibrillation: Secondary | ICD-10-CM

## 2020-04-21 DIAGNOSIS — I5022 Chronic systolic (congestive) heart failure: Secondary | ICD-10-CM

## 2020-04-21 LAB — CUP PACEART REMOTE DEVICE CHECK
Battery Remaining Longevity: 82 mo
Battery Remaining Percentage: 95.5 %
Battery Voltage: 2.98 V
Brady Statistic AP VP Percent: 7.6 %
Brady Statistic AP VS Percent: 1 %
Brady Statistic AS VP Percent: 92 %
Brady Statistic AS VS Percent: 1 %
Brady Statistic RA Percent Paced: 7.6 %
Date Time Interrogation Session: 20220308020013
Implantable Lead Implant Date: 20081110
Implantable Lead Implant Date: 20081110
Implantable Lead Implant Date: 20081110
Implantable Lead Location: 753858
Implantable Lead Location: 753859
Implantable Lead Location: 753860
Implantable Pulse Generator Implant Date: 20201204
Lead Channel Impedance Value: 440 Ohm
Lead Channel Impedance Value: 540 Ohm
Lead Channel Impedance Value: 860 Ohm
Lead Channel Pacing Threshold Amplitude: 0.625 V
Lead Channel Pacing Threshold Amplitude: 1.125 V
Lead Channel Pacing Threshold Amplitude: 1.75 V
Lead Channel Pacing Threshold Pulse Width: 0.4 ms
Lead Channel Pacing Threshold Pulse Width: 0.4 ms
Lead Channel Pacing Threshold Pulse Width: 1.5 ms
Lead Channel Sensing Intrinsic Amplitude: 12 mV
Lead Channel Sensing Intrinsic Amplitude: 3.1 mV
Lead Channel Setting Pacing Amplitude: 1.625
Lead Channel Setting Pacing Amplitude: 2.125
Lead Channel Setting Pacing Amplitude: 2.5 V
Lead Channel Setting Pacing Pulse Width: 0.4 ms
Lead Channel Setting Pacing Pulse Width: 1.5 ms
Lead Channel Setting Sensing Sensitivity: 4 mV
Pulse Gen Model: 3222
Pulse Gen Serial Number: 9132853

## 2020-04-30 NOTE — Progress Notes (Signed)
Remote pacemaker transmission.   

## 2020-05-06 ENCOUNTER — Other Ambulatory Visit: Payer: Self-pay | Admitting: Internal Medicine

## 2020-05-08 ENCOUNTER — Other Ambulatory Visit: Payer: Self-pay

## 2020-05-08 MED ORDER — FUROSEMIDE 20 MG PO TABS: 20.0000 mg | ORAL_TABLET | Freq: Every day | ORAL | 3 refills | Status: DC

## 2020-05-08 NOTE — Telephone Encounter (Signed)
Pt's medication was sent to pt's pharmacy as requested. Confirmation received.  °

## 2020-06-23 ENCOUNTER — Other Ambulatory Visit: Payer: Self-pay | Admitting: Internal Medicine

## 2020-07-21 ENCOUNTER — Ambulatory Visit (INDEPENDENT_AMBULATORY_CARE_PROVIDER_SITE_OTHER): Payer: Medicare HMO

## 2020-07-21 DIAGNOSIS — I428 Other cardiomyopathies: Secondary | ICD-10-CM

## 2020-07-21 LAB — CUP PACEART REMOTE DEVICE CHECK
Battery Remaining Longevity: 83 mo
Battery Remaining Percentage: 95.5 %
Battery Voltage: 2.98 V
Brady Statistic AP VP Percent: 10 %
Brady Statistic AP VS Percent: 1 %
Brady Statistic AS VP Percent: 90 %
Brady Statistic AS VS Percent: 1 %
Brady Statistic RA Percent Paced: 10 %
Date Time Interrogation Session: 20220607020018
Implantable Lead Implant Date: 20081110
Implantable Lead Implant Date: 20081110
Implantable Lead Implant Date: 20081110
Implantable Lead Location: 753858
Implantable Lead Location: 753859
Implantable Lead Location: 753860
Implantable Pulse Generator Implant Date: 20201204
Lead Channel Impedance Value: 480 Ohm
Lead Channel Impedance Value: 540 Ohm
Lead Channel Impedance Value: 830 Ohm
Lead Channel Pacing Threshold Amplitude: 0.625 V
Lead Channel Pacing Threshold Amplitude: 1.125 V
Lead Channel Pacing Threshold Amplitude: 1.75 V
Lead Channel Pacing Threshold Pulse Width: 0.4 ms
Lead Channel Pacing Threshold Pulse Width: 0.4 ms
Lead Channel Pacing Threshold Pulse Width: 1.5 ms
Lead Channel Sensing Intrinsic Amplitude: 12 mV
Lead Channel Sensing Intrinsic Amplitude: 2.1 mV
Lead Channel Setting Pacing Amplitude: 1.625
Lead Channel Setting Pacing Amplitude: 2.125
Lead Channel Setting Pacing Amplitude: 2.5 V
Lead Channel Setting Pacing Pulse Width: 0.4 ms
Lead Channel Setting Pacing Pulse Width: 1.5 ms
Lead Channel Setting Sensing Sensitivity: 4 mV
Pulse Gen Model: 3222
Pulse Gen Serial Number: 9132853

## 2020-08-12 NOTE — Progress Notes (Signed)
Remote pacemaker transmission.   

## 2020-08-16 ENCOUNTER — Other Ambulatory Visit: Payer: Self-pay | Admitting: Internal Medicine

## 2020-08-18 NOTE — Telephone Encounter (Signed)
Prescription refill request for Eliquis received. Indication: afib  Last office visit: Kristin Gomez, 02/28/2020 Scr: 1.03, 01/03/2020 Age:  75 yo  Weight: 70.8 kg   Pt is on the correct dose of Eliquis per dosing criteria, prescription refill sent for Eliquis 5mg  BID.

## 2020-10-20 ENCOUNTER — Ambulatory Visit (INDEPENDENT_AMBULATORY_CARE_PROVIDER_SITE_OTHER): Payer: Medicare HMO

## 2020-10-20 DIAGNOSIS — I480 Paroxysmal atrial fibrillation: Secondary | ICD-10-CM

## 2020-10-20 DIAGNOSIS — I428 Other cardiomyopathies: Secondary | ICD-10-CM

## 2020-10-20 LAB — CUP PACEART REMOTE DEVICE CHECK
Battery Remaining Longevity: 56 mo
Battery Remaining Percentage: 69 %
Battery Voltage: 2.98 V
Brady Statistic AP VP Percent: 11 %
Brady Statistic AP VS Percent: 1 %
Brady Statistic AS VP Percent: 89 %
Brady Statistic AS VS Percent: 1 %
Brady Statistic RA Percent Paced: 11 %
Date Time Interrogation Session: 20220906020015
Implantable Lead Implant Date: 20081110
Implantable Lead Implant Date: 20081110
Implantable Lead Implant Date: 20081110
Implantable Lead Location: 753858
Implantable Lead Location: 753859
Implantable Lead Location: 753860
Implantable Pulse Generator Implant Date: 20201204
Lead Channel Impedance Value: 440 Ohm
Lead Channel Impedance Value: 480 Ohm
Lead Channel Impedance Value: 900 Ohm
Lead Channel Pacing Threshold Amplitude: 0.625 V
Lead Channel Pacing Threshold Amplitude: 1.125 V
Lead Channel Pacing Threshold Amplitude: 1.75 V
Lead Channel Pacing Threshold Pulse Width: 0.4 ms
Lead Channel Pacing Threshold Pulse Width: 0.4 ms
Lead Channel Pacing Threshold Pulse Width: 1.5 ms
Lead Channel Sensing Intrinsic Amplitude: 12 mV
Lead Channel Sensing Intrinsic Amplitude: 2.7 mV
Lead Channel Setting Pacing Amplitude: 1.625
Lead Channel Setting Pacing Amplitude: 2.125
Lead Channel Setting Pacing Amplitude: 2.5 V
Lead Channel Setting Pacing Pulse Width: 0.4 ms
Lead Channel Setting Pacing Pulse Width: 1.5 ms
Lead Channel Setting Sensing Sensitivity: 4 mV
Pulse Gen Model: 3222
Pulse Gen Serial Number: 9132853

## 2020-10-29 NOTE — Progress Notes (Signed)
Remote pacemaker transmission.   

## 2020-12-15 ENCOUNTER — Other Ambulatory Visit: Payer: Self-pay | Admitting: Neurology

## 2021-01-19 ENCOUNTER — Ambulatory Visit (INDEPENDENT_AMBULATORY_CARE_PROVIDER_SITE_OTHER): Payer: Medicare HMO

## 2021-01-19 DIAGNOSIS — I428 Other cardiomyopathies: Secondary | ICD-10-CM | POA: Diagnosis not present

## 2021-01-19 LAB — CUP PACEART REMOTE DEVICE CHECK
Battery Remaining Longevity: 54 mo
Battery Remaining Percentage: 65 %
Battery Voltage: 2.98 V
Brady Statistic AP VP Percent: 14 %
Brady Statistic AP VS Percent: 1 %
Brady Statistic AS VP Percent: 85 %
Brady Statistic AS VS Percent: 1 %
Brady Statistic RA Percent Paced: 14 %
Date Time Interrogation Session: 20221206020015
Implantable Lead Implant Date: 20081110
Implantable Lead Implant Date: 20081110
Implantable Lead Implant Date: 20081110
Implantable Lead Location: 753858
Implantable Lead Location: 753859
Implantable Lead Location: 753860
Implantable Pulse Generator Implant Date: 20201204
Lead Channel Impedance Value: 460 Ohm
Lead Channel Impedance Value: 540 Ohm
Lead Channel Impedance Value: 900 Ohm
Lead Channel Pacing Threshold Amplitude: 0.625 V
Lead Channel Pacing Threshold Amplitude: 1.125 V
Lead Channel Pacing Threshold Amplitude: 1.75 V
Lead Channel Pacing Threshold Pulse Width: 0.4 ms
Lead Channel Pacing Threshold Pulse Width: 0.4 ms
Lead Channel Pacing Threshold Pulse Width: 1.5 ms
Lead Channel Sensing Intrinsic Amplitude: 12 mV
Lead Channel Sensing Intrinsic Amplitude: 2.2 mV
Lead Channel Setting Pacing Amplitude: 1.625
Lead Channel Setting Pacing Amplitude: 2.125
Lead Channel Setting Pacing Amplitude: 2.5 V
Lead Channel Setting Pacing Pulse Width: 0.4 ms
Lead Channel Setting Pacing Pulse Width: 1.5 ms
Lead Channel Setting Sensing Sensitivity: 4 mV
Pulse Gen Model: 3222
Pulse Gen Serial Number: 9132853

## 2021-01-28 NOTE — Progress Notes (Signed)
Remote pacemaker transmission.   

## 2021-01-31 ENCOUNTER — Other Ambulatory Visit: Payer: Self-pay | Admitting: Internal Medicine

## 2021-03-02 ENCOUNTER — Other Ambulatory Visit: Payer: Self-pay | Admitting: Internal Medicine

## 2021-03-02 DIAGNOSIS — I4891 Unspecified atrial fibrillation: Secondary | ICD-10-CM

## 2021-03-02 DIAGNOSIS — Z5181 Encounter for therapeutic drug level monitoring: Secondary | ICD-10-CM

## 2021-03-03 NOTE — Telephone Encounter (Signed)
Prescription refill request for Eliquis received. Indication:Afib  Last office visit:01/03/20 Harrington Challenger)  Scr: 1.03 (01/03/20)  Age: 76 Weight: 70.8kg  Pt overdue for OV with cardiology and labs. Appt scheduled with Dr Caryl Comes on 04/22/21. Will order labs to be completed at this visit. Called and spoke with pt's daughter. She stated pt is almost out of Eliquis and needs a refill to last until appt on 04/22/21. Refill sent to requested pharmacy.

## 2021-03-03 NOTE — Telephone Encounter (Signed)
Prescription refill request for Eliquis received. Indication:Afib  Last office visit:01/03/20 Harrington Challenger)  Scr: 1.03 (01/03/20)  Age: 76 Weight: 70.8kg  Pt overdue for office visit with Dr Harrington Challenger and labs. Message sent to schedulers.

## 2021-03-04 ENCOUNTER — Other Ambulatory Visit: Payer: Self-pay | Admitting: Internal Medicine

## 2021-03-12 ENCOUNTER — Other Ambulatory Visit: Payer: Self-pay | Admitting: Neurology

## 2021-03-15 NOTE — Telephone Encounter (Signed)
Rx refilled.

## 2021-03-25 ENCOUNTER — Other Ambulatory Visit: Payer: Self-pay | Admitting: Internal Medicine

## 2021-04-20 ENCOUNTER — Ambulatory Visit (INDEPENDENT_AMBULATORY_CARE_PROVIDER_SITE_OTHER): Payer: Medicare HMO

## 2021-04-20 DIAGNOSIS — I428 Other cardiomyopathies: Secondary | ICD-10-CM

## 2021-04-20 LAB — CUP PACEART REMOTE DEVICE CHECK
Battery Remaining Longevity: 50 mo
Battery Remaining Percentage: 62 %
Battery Voltage: 2.98 V
Brady Statistic AP VP Percent: 17 %
Brady Statistic AP VS Percent: 1 %
Brady Statistic AS VP Percent: 83 %
Brady Statistic AS VS Percent: 1 %
Brady Statistic RA Percent Paced: 17 %
Date Time Interrogation Session: 20230307020013
Implantable Lead Implant Date: 20081110
Implantable Lead Implant Date: 20081110
Implantable Lead Implant Date: 20081110
Implantable Lead Location: 753858
Implantable Lead Location: 753859
Implantable Lead Location: 753860
Implantable Pulse Generator Implant Date: 20201204
Lead Channel Impedance Value: 440 Ohm
Lead Channel Impedance Value: 480 Ohm
Lead Channel Impedance Value: 890 Ohm
Lead Channel Pacing Threshold Amplitude: 0.625 V
Lead Channel Pacing Threshold Amplitude: 1.25 V
Lead Channel Pacing Threshold Amplitude: 1.75 V
Lead Channel Pacing Threshold Pulse Width: 0.4 ms
Lead Channel Pacing Threshold Pulse Width: 0.4 ms
Lead Channel Pacing Threshold Pulse Width: 1.5 ms
Lead Channel Sensing Intrinsic Amplitude: 12 mV
Lead Channel Sensing Intrinsic Amplitude: 2.5 mV
Lead Channel Setting Pacing Amplitude: 1.625
Lead Channel Setting Pacing Amplitude: 2.25 V
Lead Channel Setting Pacing Amplitude: 2.5 V
Lead Channel Setting Pacing Pulse Width: 0.4 ms
Lead Channel Setting Pacing Pulse Width: 1.5 ms
Lead Channel Setting Sensing Sensitivity: 4 mV
Pulse Gen Model: 3222
Pulse Gen Serial Number: 9132853

## 2021-04-22 ENCOUNTER — Other Ambulatory Visit: Payer: Self-pay

## 2021-04-22 ENCOUNTER — Ambulatory Visit (INDEPENDENT_AMBULATORY_CARE_PROVIDER_SITE_OTHER): Payer: Medicare HMO | Admitting: Internal Medicine

## 2021-04-22 ENCOUNTER — Encounter: Payer: Self-pay | Admitting: Internal Medicine

## 2021-04-22 ENCOUNTER — Other Ambulatory Visit: Payer: Medicare HMO

## 2021-04-22 VITALS — BP 122/70 | HR 78 | Ht 64.0 in | Wt 151.0 lb

## 2021-04-22 DIAGNOSIS — T82110A Breakdown (mechanical) of cardiac electrode, initial encounter: Secondary | ICD-10-CM

## 2021-04-22 DIAGNOSIS — I509 Heart failure, unspecified: Secondary | ICD-10-CM

## 2021-04-22 DIAGNOSIS — I428 Other cardiomyopathies: Secondary | ICD-10-CM | POA: Diagnosis not present

## 2021-04-22 DIAGNOSIS — Z950 Presence of cardiac pacemaker: Secondary | ICD-10-CM | POA: Diagnosis not present

## 2021-04-22 DIAGNOSIS — I4891 Unspecified atrial fibrillation: Secondary | ICD-10-CM

## 2021-04-22 DIAGNOSIS — I480 Paroxysmal atrial fibrillation: Secondary | ICD-10-CM

## 2021-04-22 DIAGNOSIS — Z5181 Encounter for therapeutic drug level monitoring: Secondary | ICD-10-CM

## 2021-04-22 NOTE — Patient Instructions (Signed)
Medication Instructions:  ?Your physician recommends that you continue on your current medications as directed. Please refer to the Current Medication list given to you today. ? ?*If you need a refill on your cardiac medications before your next appointment, please call your pharmacy* ? ? ?Lab Work: ?None ordered. ? ?If you have labs (blood work) drawn today and your tests are completely normal, you will receive your results only by: ?MyChart Message (if you have MyChart) OR ?A paper copy in the mail ?If you have any lab test that is abnormal or we need to change your treatment, we will call you to review the results. ? ? ?Testing/Procedures: ?A chest x-ray takes a picture of the organs and structures inside the chest, including the heart, lungs, and blood vessels. This test can show several things, including, whether the heart is enlarges; whether fluid is building up in the lungs; and whether pacemaker / defibrillator leads are still in place.  ? ? ?Follow-Up: ?At [facility], you and your health needs are our priority.  As part of our continuing mission to provide you with exceptional heart care, we have created designated Provider Care Teams.  These Care Teams include your primary Cardiologist (physician) and Advanced Practice Providers (APPs -  Physician Assistants and Nurse Practitioners) who all work together to provide you with the care you need, when you need it. ? ?We recommend signing up for the patient portal called "MyChart".  Sign up information is provided on this After Visit Summary.  MyChart is used to connect with patients for Virtual Visits (Telemedicine).  Patients are able to view lab/test results, encounter notes, upcoming appointments, etc.  Non-urgent messages can be sent to your provider as well.   ?To learn more about what you can do with MyChart, go to [email or web address].   ? ?Your next appointment:   ?To be determined ?

## 2021-04-22 NOTE — Progress Notes (Signed)
? ? ? ? ?Patient Care Team: ?Bonnita Nasuti, MD as PCP - General (Internal Medicine) ?Fay Records, MD as PCP - Cardiology (Cardiology) ? ? ?HPI ? ?Kristin Gomez is a 76 y.o. female ?Seen in follow-up for what is best as we can determine was a CRT-P implantation 2008  by Dr. Minna Merritts at [facility].  She underwent generator replacement in 2/17 and then again she underwent generator replacement 12/20. ? ?She has a history of atrial fibrillation and is anticoagulated w Apixoban; no bleeding ?  ? ?Progressive dementia, not thought likely to remember that she was here tomorrow ? ?Complaints of chest pain, chronic shortness of breath, some edema. ? ?  ?   ?DATE TEST EF    ?5/19 Echo   60-65 %    ? 5/20 Echo  45-50%     ?9/20 Echo  50-55% E/E' 51  ?  ?Date Cr K Hgb  ?10/19 1.12 4.5 13.9 (8/19)  ?12/20 1.14 4.6 13.2  ?11/21 1.03 4.2 14.4  ? ?Thromboembolic risk factors ( age -35, HTN-1, TIA/CVA-2, DM-1, CHF-1, Gender-1) for a CHADSVASc Score of =>7 she is anticoagulated with apixaban. ?  ?Device History: ?  ?  Date Replacement  Status   ?RA Lead 2008    ?RV Lead 2008    ?LV Lead 2008    ?Generator 2008 2017/2020   ?   ? ?Records and Results Reviewed  ? ?Past Medical History:  ?Diagnosis Date  ? Arthritis   ? "all over" (09/26/2017)  ? Asthma   ? Diabetes mellitus   ? Fibromyalgia   ? Heart disease   ? High cholesterol   ? History of blood transfusion   ? "related to a surgery" (09/26/2017)  ? Hypertension   ? Memory difficulty 07/26/2017  ? Myocardial infarction [facility])   ? "years ago" (09/26/2017)  ? Pacemaker   ? 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 Stroke [facility]) 02/2017  ? "affected my memory" (09/26/2017)  ? Type II diabetes mellitus ([place])   ? ? ?Past Surgical History:  ?Procedure Laterality Date  ? CARDIAC CATHETERIZATION    ? CATARACT EXTRACTION W/ INTRAOCULAR LENS  IMPLANT, BILATERAL Bilateral   ? CHOLECYSTECTOMY    ? COLONOSCOPY    ? I & D EXTREMITY  03/31/2011  ? Procedure: IRRIGATION AND DEBRIDEMENT EXTREMITY;   Surgeon: Tennis Must, MD;  Location: [place];  Service: Orthopedics;  Laterality: Left;  left long finger  ? INSERT / REPLACE / REMOVE PACEMAKER    ? KNEE ARTHROSCOPY Left   ? MASS EXCISION  03/08/2011  ? Procedure: EXCISION MASS;  Surgeon: Tennis Must, MD;  Location: [place];  Service: Orthopedics;  Laterality: Left;  Excision Mass Left Long Finger and Debridement of Distal Interphalangeal  Joint  ? [place] N/A 01/18/2019  ? Procedure: PPM GENERATOR CHANGEOUT;  Surgeon: Deboraha Sprang, MD;  Location: [place] CV LAB;  Service: Cardiovascular;  Laterality: N/A;  ? TONSILLECTOMY    ? TOTAL ABDOMINAL HYSTERECTOMY    ? TUBAL LIGATION    ? ? ?Current Meds  ?Medication Sig  ? albuterol (PROVENTIL HFA;VENTOLIN HFA) 108 (90 Base) MCG/ACT inhaler Inhale 2 puffs into the lungs every 6 (six) hours as needed for wheezing or shortness of breath.   ? carvedilol (COREG) 12.5 MG tablet Take 1 tablet (12.5 mg total) by mouth 2 (two) times daily. Please keep upcoming appt for future refills Thank you 1st  attempt  ? Cholecalciferol (VITAMIN D3) 50 MCG (2000 UT) capsule Take 2,000 Units by mouth daily.   ? diphenhydramine-acetaminophen (TYLENOL PM) 25-500 MG TABS tablet Take 1-2 tablets by mouth at bedtime as needed (pain).  ? doxycycline (VIBRAMYCIN) 100 MG capsule Take 100 mg by mouth daily.  ? ELIQUIS 5 MG TABS tablet Take 1 tablet by mouth twice daily  ? furosemide (LASIX) 20 MG tablet Take 1 tablet (20 mg total) by mouth daily.  ? glipiZIDE (GLUCOTROL XL) 5 MG 24 hr tablet Take 5 mg by mouth at bedtime.  ? ipratropium-albuterol (DUONEB) 0.5-2.5 (3) MG/3ML SOLN Take 3 mLs by nebulization every 6 (six) hours as needed (for shortness of breath or wheezing).   ? levothyroxine (SYNTHROID) 25 MCG tablet Take 25 mcg by mouth daily before breakfast.  ? lisinopril (ZESTRIL) 5 MG tablet Take 1 tablet by mouth daily.  ? memantine (NAMENDA) 10 MG tablet Take 1 tablet by mouth twice  daily  ? methylPREDNISolone (MEDROL) 4 MG tablet Take 4 mg by mouth as needed (Fibermyalgia).   ? omeprazole (PRILOSEC) 20 MG capsule Take 20 mg by mouth daily as needed (acid reflux).   ? potassium chloride SA (K-DUR,KLOR-CON) 20 MEQ tablet Take 20 mEq by mouth daily.  ? rosuvastatin (CRESTOR) 20 MG tablet Take 1 tablet (20 mg total) by mouth daily.  ? ? ?Allergies  ?Allergen Reactions  ? Aspirin Shortness Of Breath and Other (See Comments)  ?  Patient states that this caused an asthma attack ?Patient states that is causes an asthma attack.  ? Azithromycin Anaphylaxis  ? Infliximab Hives  ?  remicade  ? Methotrexate Hives  ? Mushroom Extract Complex Anaphylaxis, Shortness Of Breath and Swelling  ? Nsaids Shortness Of Breath and Other (See Comments)  ?  Asthma attack ?Asthma attack  ? Avelox [Moxifloxacin Hcl In Nacl] Swelling  ?  Site of swelling not recalled  ? Diovan [Valsartan] Swelling  ?  Site of swelling not recalled  ? Donepezil Diarrhea  ? Donepezil Hcl Other (See Comments)  ?  Diarrhea ?Diarrhea  ? Moxifloxacin Swelling  ? Other Swelling  ?  Site of swelling not recalled  ? Oxycodone-Acetaminophen Nausea Only  ? Amoxicillin Rash  ? Atorvastatin Swelling and Other (See Comments)  ?  Exacerbated Psoriatic arthritis   ? Contrast Media [Iodinated Contrast Media] Rash  ? Iodine Rash  ? Latex Rash  ? Metformin Rash  ?  ??  ? Metformin And Related Rash  ?  ??  ? Penicillins Rash  ?  Has patient had a PCN reaction causing immediate rash, facial/tongue/throat swelling, SOB or lightheadedness with hypotension: Yes ?Has patient had a PCN reaction causing severe rash involving mucus membranes or skin necrosis: No ?Has patient had a PCN reaction that required hospitalization: No ?Has patient had a PCN reaction occurring within the last 10 years: No ?If all of the above answers are "NO", then may proceed with Cephalosporin use. ?  ? Percocet [Oxycodone-Acetaminophen] Nausea Only  ? Prednisone Rash and Other (See  Comments)  ?  Can only be tolerated with Benadryl ?HAS TO TAKE BENADRYL WITH PREDNISONE  ? Propofol Rash  ? Sulfa Antibiotics Nausea Only and Rash  ? Vicodin [Hydrocodone-Acetaminophen] Nausea Only  ? ? ? ? ?Review of Systems negative except from HPI and PMH ? ?Physical Exam ?BP 122/70   Pulse 78   Ht '5\' 4"'$  (1.626 m)   Wt 151 lb (68.5 kg)   SpO2 99%   BMI  25.92 kg/m?  ?Well developed and well nourished in no acute distress ?HENT normal ?Neck supple with JVP-flat ?Clear ?Device pocket well healed; without hematoma or erythema.  There is no tethering  ?Regular rate and rhythm, no  murmur ?Abd-soft with active BS ?No Clubbing cyanosis  edema ?Skin-warm and dry ?A & Oriented  Grossly normal sensory and motor function ? ?ECG sinus with P synchronous pacing with intermittent atrial pacing and tracking of PACs with a negative QRS duration lead V1 and upright QRS duration lead I and a QRSd of approximately 150 ms ? ?Multiple reprogramming's were undertaken which demonstrated that right ventricular pacing and left ventricular pacing had a similar QRS morphology with perhaps a slight diminution in QRS width with RV greater than LV by about 20 ms. ? ?ECG 10/19 QRS upright lead V1 negative lead I QRSd approximately 115 ms  ?ECG 2013 demonstrates a QRSd of 108 ms with an   rS in V1 and a qR in lead I   ?Assessment and  Plan ? ?Assessment and plan  ? ?NICM ? ?CRT-P ? ?HFpEF  ?  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  ?Afib  RVR   infreq ?  ?Rheumatoid arthritis ?  ?CVA-recurrent ? ?Dementia ? ? ?She has a probable LV lead dislodgment based on the pacing morphologies.  We will get a chest x-ray. ? ?I am not sure that in her functional status however, being limited and with her dementia, that it would be worth the undertaking of lead revision.  We will have to have these discussions later. ? ?At this juncture she remains euvolemic we will continue on furosemide 20. ? ?No bleeding we will continue her on Eliquis  5 twice daily ? ?Blood pressure is low we will continue her on lisinopril 5 and carvedilol 12.5 for her cardiomyopathy ? ? ? ? ?  ?

## 2021-04-23 ENCOUNTER — Telehealth: Payer: Self-pay | Admitting: Internal Medicine

## 2021-04-23 NOTE — Telephone Encounter (Signed)
Spoke with Kristin Gomez's daughter, DPR and advised Kristin Gomez may go to [facility], outpatient center for Xray.  Order faxed.  Kristin Gomez's daughter verbalizes understanding and agrees with current plan. ?

## 2021-04-23 NOTE — Telephone Encounter (Signed)
Patient's daughter is calling wanting to know if they are able to get the DG chest 2 view that Dr. Caryl Comes ordered yesterday done in [place]. She states they did not make it to [place] in time yesterday to have it performed and would prefer to do it in [place] due to this if possible. Please advise.  ?

## 2021-04-24 LAB — BASIC METABOLIC PANEL
BUN/Creatinine Ratio: 13 (ref 12–28)
BUN: 15 mg/dL (ref 8–27)
CO2: 22 mmol/L (ref 20–29)
Calcium: 8.7 mg/dL (ref 8.7–10.3)
Chloride: 107 mmol/L — ABNORMAL HIGH (ref 96–106)
Creatinine, Ser: 1.12 mg/dL — ABNORMAL HIGH (ref 0.57–1.00)
Glucose: 173 mg/dL — ABNORMAL HIGH (ref 70–99)
Potassium: 3.9 mmol/L (ref 3.5–5.2)
Sodium: 147 mmol/L — ABNORMAL HIGH (ref 134–144)
eGFR: 51 mL/min/{1.73_m2} — ABNORMAL LOW (ref >59)

## 2021-04-24 LAB — CBC
Hematocrit: 43.6 % (ref 34.0–46.6)
Hemoglobin: 14.9 g/dL (ref 11.1–15.9)
MCH: 32 pg (ref 26.6–33.0)
MCHC: 34.2 g/dL (ref 31.5–35.7)
MCV: 94 fL (ref 79–97)
Platelets: 191 10*3/uL (ref 150–450)
RBC: 4.66 x10E6/uL (ref 3.77–5.28)
RDW: 12.6 % (ref 11.7–15.4)
WBC: 7.8 10*3/uL (ref 3.4–10.8)

## 2021-04-28 ENCOUNTER — Other Ambulatory Visit: Payer: Self-pay | Admitting: Internal Medicine

## 2021-05-03 NOTE — Progress Notes (Signed)
Remote pacemaker transmission.   

## 2021-05-04 ENCOUNTER — Telehealth: Payer: Self-pay

## 2021-05-04 NOTE — Telephone Encounter (Signed)
Spoke with pt's daughter, [name], [place] and advised of lab results per Dr Caryl Comes: ? ?Deboraha Sprang, MD  ?04/27/2021  6:50 PM EDT  ? ?Please Inform Patient ?  ?Labs are normal x mild elevated Na-- should increase water intake and followup with her PCP.   ? ?Pt's daughter verbalizes understanding and thanked Therapist, sports for the phone call. ? ? ?

## 2021-05-04 NOTE — Telephone Encounter (Signed)
-----   Message from Deboraha Sprang, MD sent at 04/27/2021  6:50 PM EDT ----- ?Please Inform Patient ? ?Labs are normal x mild elevated Na-- should increase water intake and followup with her PCP ? ?Thanks  ?

## 2021-06-02 ENCOUNTER — Other Ambulatory Visit: Payer: Self-pay | Admitting: Internal Medicine

## 2021-06-02 DIAGNOSIS — I480 Paroxysmal atrial fibrillation: Secondary | ICD-10-CM

## 2021-06-03 NOTE — Telephone Encounter (Signed)
Eliquis '5mg'$  refill request received. Patient is 76 years old, weight-68.5kg, Crea-1.12 on 3/92023, Diagnosis-Afib, and last seen by Dr. Caryl Comes on 04/22/2021. Dose is appropriate based on dosing criteria. Will send in refill to requested pharmacy.   ?

## 2021-06-09 ENCOUNTER — Other Ambulatory Visit: Payer: Self-pay | Admitting: Internal Medicine

## 2021-06-20 ENCOUNTER — Other Ambulatory Visit: Payer: Self-pay | Admitting: Neurology

## 2021-06-21 NOTE — Telephone Encounter (Signed)
Rx refilled. Note added to pharmacy: Appointment needed for further refills. ?

## 2021-06-30 ENCOUNTER — Other Ambulatory Visit: Payer: Self-pay | Admitting: Internal Medicine

## 2021-07-02 ENCOUNTER — Ambulatory Visit: Payer: Medicare HMO | Admitting: Internal Medicine

## 2021-07-15 ENCOUNTER — Ambulatory Visit (HOSPITAL_BASED_OUTPATIENT_CLINIC_OR_DEPARTMENT_OTHER): Payer: Medicare HMO | Admitting: Family

## 2021-07-15 VITALS — BP 102/70 | HR 66 | Ht 64.0 in | Wt 150.3 lb

## 2021-07-15 DIAGNOSIS — I480 Paroxysmal atrial fibrillation: Secondary | ICD-10-CM | POA: Diagnosis not present

## 2021-07-15 DIAGNOSIS — D6859 Other primary thrombophilia: Secondary | ICD-10-CM | POA: Diagnosis not present

## 2021-07-15 DIAGNOSIS — E782 Mixed hyperlipidemia: Secondary | ICD-10-CM | POA: Diagnosis not present

## 2021-07-15 DIAGNOSIS — I428 Other cardiomyopathies: Secondary | ICD-10-CM | POA: Diagnosis not present

## 2021-07-15 MED ORDER — ROSUVASTATIN CALCIUM 20 MG PO TABS: 20.0000 mg | ORAL_TABLET | Freq: Every day | ORAL | 1 refills | Status: AC

## 2021-07-15 MED ORDER — FUROSEMIDE 20 MG PO TABS: 20.0000 mg | ORAL_TABLET | Freq: Every day | ORAL | 1 refills | Status: DC

## 2021-07-15 NOTE — Patient Instructions (Addendum)
Medication Instructions:  Continue current medications.   Add additional Lasix '20mg'$  to your normal morning dose as needed for weight gain 2 pounds overnight or 5 pounds in one week.  *If you need a refill on your cardiac medications before your next appointment, please call your pharmacy*  Lab Work: None ordered today.   Please ask Dr. London Pepper to collect liver enzymes, cholesterol panel.  Testing/Procedures: None ordered today.   Follow-Up: At [facility], you and your health needs are our priority.  As part of our continuing mission to provide you with exceptional heart care, we have created designated Provider Care Teams.  These Care Teams include your primary Cardiologist (physician) and Advanced Practice Providers (APPs -  Physician Assistants and Nurse Practitioners) who all work together to provide you with the care you need, when you need it.  We recommend signing up for the patient portal called "MyChart".  Sign up information is provided on this After Visit Summary.  MyChart is used to connect with patients for Virtual Visits (Telemedicine).  Patients are able to view lab/test results, encounter notes, upcoming appointments, etc.  Non-urgent messages can be sent to your provider as well.   To learn more about what you can do with MyChart, go to [email or web address].    Your next appointment:   6 month(s)  The format for your next appointment:   In Person  Provider:   Dorris Carnes, MD    Other Instructions  Heart Healthy Diet Recommendations: A low-salt diet is recommended. Meats should be grilled, baked, or boiled. Avoid fried foods. Focus on lean protein sources like fish or chicken with vegetables and fruits. The American Heart Association is a Microbiologist!  American Heart Association Diet and Lifeystyle Recommendations   Exercise recommendations: The American Heart Association recommends 150 minutes of moderate intensity exercise weekly. Try 30 minutes of moderate  intensity exercise 4-5 times per week. This could include walking, jogging, or swimming.   Important Information About Sugar

## 2021-07-15 NOTE — Progress Notes (Unsigned)
Office Visit    Patient Name: Kristin Gomez Date of Encounter: 07/17/2021  PCP:  Bonnita Nasuti, [place]  Cardiologist:  Dorris Carnes, MD  Advanced Practice Provider:  No care team member to display Electrophysiologist:  None    Chief Complaint    Kristin Gomez is a 76 y.o. 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[place])    Stroke ([place]) 02/2017   "affected my memory" (09/26/2017)   Type II diabetes mellitus ([place])    Past Surgical History:  Procedure Laterality Date   CARDIAC CATHETERIZATION     CATARACT EXTRACTION W/ INTRAOCULAR LENS  IMPLANT, BILATERAL Bilateral    CHOLECYSTECTOMY     COLONOSCOPY     I & D EXTREMITY  03/31/2011   Procedure: IRRIGATION AND DEBRIDEMENT EXTREMITY;  Surgeon: Tennis Must, MD;  Location: [place];  Service: Orthopedics;  Laterality: Left;  left long finger   INSERT / REPLACE / REMOVE PACEMAKER     KNEE ARTHROSCOPY Left    MASS EXCISION  03/08/2011   Procedure: EXCISION MASS;  Surgeon: Tennis Must, MD;  Location: [place];  Service: Orthopedics;  Laterality: Left;  Excision Mass Left Long Finger and Debridement of Distal Interphalangeal  Joint   PPM GENERATOR CHANGEOUT N/A 01/18/2019   Procedure: PPM GENERATOR CHANGEOUT;  Surgeon: Deboraha Sprang, MD;  Location: [place] [facility];  Service: Cardiovascular;  Laterality: N/A;   TONSILLECTOMY     TOTAL ABDOMINAL HYSTERECTOMY      TUBAL LIGATION      Allergies  Allergies  Allergen Reactions   Aspirin Shortness Of Breath and Other (See Comments)    Patient states that this caused an asthma attack Patient states that is causes an asthma attack.   Azithromycin Anaphylaxis   Infliximab Hives    remicade   Methotrexate Hives   Mushroom Extract Complex Anaphylaxis, Shortness Of Breath and Swelling   Nsaids Shortness Of Breath and Other (See Comments)    Asthma attack Asthma attack   Avelox [Moxifloxacin Hcl In Nacl] Swelling    Site of swelling not recalled   Diovan [Valsartan] Swelling    Site of swelling not recalled   Donepezil Diarrhea   Donepezil Hcl Other (See Comments)    Diarrhea Diarrhea   Moxifloxacin Swelling   Other Swelling    Site of swelling not recalled   Oxycodone-Acetaminophen Nausea Only   Amoxicillin Rash   Atorvastatin Swelling and Other (See Comments)    Exacerbated Psoriatic arthritis    Contrast Media [Iodinated Contrast Media] Rash   Iodine Rash   Latex Rash   Metformin Rash    ??   Metformin And Related Rash    ??   Penicillins Rash    Has patient had a PCN reaction causing immediate rash, facial/tongue/throat swelling, SOB or lightheadedness with hypotension: Yes  Has patient had a PCN reaction causing severe rash involving mucus membranes or skin necrosis: No Has patient had a PCN reaction that required hospitalization: No Has patient had a PCN reaction occurring within the last 10 years: No If all of the above answers are "NO", then may proceed with Cephalosporin use.    Percocet [Oxycodone-Acetaminophen] Nausea Only   Prednisone Rash and Other (See Comments)    Can only be tolerated with Benadryl HAS TO TAKE BENADRYL WITH PREDNISONE   Propofol Rash   Sulfa Antibiotics Nausea Only and Rash   Vicodin [Hydrocodone-Acetaminophen] Nausea Only    History of Present Illness    Kristin Gomez is a 76 y.o. female with a hx of NICM/HFpEF, s/p CRT-P, dementia, atrial  fibrillation, RA, CVA last seen 04/22/2021 by Dr. Caryl Comes.  CRT-P implantation 2008 by Dr. Minna Merritts at [facility].  Generator replacement 04/06/2015 and 02/03/2019.  Echo 06/2017 EF 60 to 65%, 06/2018 45-50%, 10/2018 50-55%.  When last seen 04/22/2021 by Dr. Caryl Comes noted probable LV lead dislodgment based on pacing morphologies.  Chest x-ray obtained.  However given limited functional status and dementia still determining whether lead revision would be pursued if necessary.  Presents today for follow-up with her daughter.  She is very excited as her granddaughter is in the hospital getting ready to have a baby.  This will be her sixth great grandchild.  Her daughter assists with history taking.  Her daughter and other family members help her to maintain medications, daily weights, blood pressures.  Weights have been well controlled at home. Takes extra Lasix periodically when weight goes up or she has significant swelling.  Exertional dyspnea stable at baseline.  No new edema, orthopnea, PND, chest pain. Labs via PCP 04/22/2021 creatinine 1.2, GFR 51, K3.9, NA 147, hemoglobin 14.9.  BP at home ranging 106-130 over 50s to 70s.  EKGs/Labs/Other Studies Reviewed:   The following studies were reviewed today:   EKG: No EKG today  Recent Labs: 04/22/2021: BUN 15; Creatinine, Ser 1.12; Hemoglobin 14.9; Platelets 191; Potassium 3.9; Sodium 147  Recent Lipid Panel    Component Value Date/Time   CHOL 152 01/03/2020 1416   TRIG 147 01/03/2020 1416   HDL 62 01/03/2020 1416   CHOLHDL 2.5 01/03/2020 1416   LDLCALC 65 01/03/2020 1416    Risk Assessment/Calculations:    CHA2DS2-VASc Score = 8   This indicates a 10.8% annual risk of stroke. The patient's score is based upon: CHF History: 1 HTN History: 1 Diabetes History: 0 Stroke History: 2 Vascular Disease History: 0 Age Score: 2 Gender Score: 1        Home Medications   Current Meds  Medication Sig   albuterol (PROVENTIL HFA;VENTOLIN HFA) 108 (90  Base) MCG/ACT inhaler Inhale 2 puffs into the lungs every 6 (six) hours as needed for wheezing or shortness of breath.    apixaban (ELIQUIS) 5 MG TABS tablet Take 1 tablet by mouth twice daily   carvedilol (COREG) 12.5 MG tablet Take 1 tablet (12.5 mg total) by mouth 2 (two) times daily with a meal.   Cholecalciferol (VITAMIN D3) 50 MCG (2000 UT) capsule Take 2,000 Units by mouth daily.    diphenhydramine-acetaminophen (TYLENOL PM) 25-500 MG TABS tablet Take 1-2 tablets by mouth at bedtime as needed (pain).   glipiZIDE (GLUCOTROL XL) 5 MG 24 hr tablet Take 5 mg by mouth at bedtime.   ipratropium-albuterol (DUONEB) 0.5-2.5 (3) MG/3ML SOLN Take 3 mLs by nebulization every 6 (six) hours as needed (  for shortness of breath or wheezing).    levothyroxine (SYNTHROID) 25 MCG tablet Take 25 mcg by mouth daily before breakfast.   memantine (NAMENDA) 10 MG tablet Take 1 tablet by mouth twice daily   methylPREDNISolone (MEDROL) 4 MG tablet Take 4 mg by mouth as needed (Fibermyalgia).    omeprazole (PRILOSEC) 20 MG capsule Take 20 mg by mouth daily as needed (acid reflux).    potassium chloride SA (K-DUR,KLOR-CON) 20 MEQ tablet Take 20 mEq by mouth daily.   [DISCONTINUED] furosemide (LASIX) 20 MG tablet Take 1 tablet by mouth once daily   [DISCONTINUED] rosuvastatin (CRESTOR) 20 MG tablet Take 1 tablet (20 mg total) by mouth daily. Please keep upcoming appointment for future refills. Thank you.     Review of Systems      All other systems reviewed and are otherwise negative except as noted above.  Physical Exam    VS:  BP 102/70 (BP Location: Right Arm, Patient Position: Sitting, Cuff Size: Normal)   Pulse 66   Ht '5\' 4"'$  (1.626 m)   Wt 150 lb 4.8 oz (68.2 kg)   SpO2 98%   BMI 25.80 kg/m  , BMI Body mass index is 25.8 kg/m.  Wt Readings from Last 3 Encounters:  07/15/21 150 lb 4.8 oz (68.2 kg)  04/22/21 151 lb (68.5 kg)  03/12/20 156 lb (70.8 kg)     GEN: Well nourished, well developed, in no  acute distress. HEENT: normal. Neck: Supple, no JVD, carotid bruits, or masses. Cardiac: RRR, no murmurs, rubs, or gallops. No clubbing, cyanosis, edema.  Radials/PT 2+ and equal bilaterally.  Respiratory:  Respirations regular and unlabored, clear to auscultation bilaterally. GI: Soft, nontender, nondistended. MS: No deformity or atrophy. Skin: Warm and dry, no rash. Neuro:  Strength and sensation are intact. Psych: Normal affect.  Assessment & Plan    HFpEF /NICM- Euvolemic and well compensated on exam.  Continue furosemide 20 mg daily with additional 20 mg as needed for weight gain of 2 pounds overnight or 5 pounds in 1 week.  Additional GDMT includes carvedilol.  Lisinopril previously discontinued due to hypotension.Low sodium diet, fluid restriction <2L, and daily weights encouraged. Educated to contact our office for weight gain of 2 lbs overnight or 5 lbs in one week.   Atrial fibrillation -NSR by auscultation today.  Denies palpitations.  Continue Coreg 12.5 mg twice daily.  Continue Eliquis 5 mg twice daily.  Denies bleeding complications.  Does not meet dose reduction criteria.CHA2DS2-VASc Score = 8 [CHF History: 1, HTN History: 1, Diabetes History: 0, Stroke History: 2, Vascular Disease History: 0, Age Score: 2, Gender Score: 1].  Therefore, the patient's annual risk of stroke is 10.8 %.      HTN - BP well controlled. Continue current antihypertensive regimen.  Family monitors routinely at home.  History of CVA -continue rosuvastatin.  No aspirin due to chronic anticoagulation.  DM2 - Continue to follow with PCP.   HLD - Due for repeat liver enzymes, cholesterol panel.  Has upcoming labs with PCP and we will ask that these be added on.  Continue rosuvastatin 20 mg daily.  Refill provided. Denies myalgias.  S/p CRT-P -follows with Dr. Caryl Comes.  Dementia -history taking assisted by family member today.     Disposition: Follow up in 6 month(s) with Dorris Carnes, MD or  APP.  Signed, Loel Dubonnet, NP 07/17/2021, 2:28 PM [place]

## 2021-07-17 ENCOUNTER — Encounter (HOSPITAL_BASED_OUTPATIENT_CLINIC_OR_DEPARTMENT_OTHER): Payer: Self-pay | Admitting: Family

## 2021-07-20 ENCOUNTER — Ambulatory Visit (INDEPENDENT_AMBULATORY_CARE_PROVIDER_SITE_OTHER): Payer: Medicare HMO

## 2021-07-20 DIAGNOSIS — I428 Other cardiomyopathies: Secondary | ICD-10-CM

## 2021-07-20 LAB — CUP PACEART REMOTE DEVICE CHECK
Battery Remaining Longevity: 46 mo
Battery Remaining Percentage: 58 %
Battery Voltage: 2.98 V
Brady Statistic AP VP Percent: 24 %
Brady Statistic AP VS Percent: 1 %
Brady Statistic AS VP Percent: 75 %
Brady Statistic AS VS Percent: 1 %
Brady Statistic RA Percent Paced: 24 %
Date Time Interrogation Session: 20230606020015
Implantable Lead Implant Date: 20081110
Implantable Lead Implant Date: 20081110
Implantable Lead Implant Date: 20081110
Implantable Lead Location: 753858
Implantable Lead Location: 753859
Implantable Lead Location: 753860
Implantable Pulse Generator Implant Date: 20201204
Lead Channel Impedance Value: 440 Ohm
Lead Channel Impedance Value: 480 Ohm
Lead Channel Impedance Value: 830 Ohm
Lead Channel Pacing Threshold Amplitude: 0.625 V
Lead Channel Pacing Threshold Amplitude: 1.25 V
Lead Channel Pacing Threshold Amplitude: 2 V
Lead Channel Pacing Threshold Pulse Width: 0.4 ms
Lead Channel Pacing Threshold Pulse Width: 0.4 ms
Lead Channel Pacing Threshold Pulse Width: 1.5 ms
Lead Channel Sensing Intrinsic Amplitude: 2.9 mV
Lead Channel Sensing Intrinsic Amplitude: 6 mV
Lead Channel Setting Pacing Amplitude: 1.625
Lead Channel Setting Pacing Amplitude: 2.25 V
Lead Channel Setting Pacing Amplitude: 2.5 V
Lead Channel Setting Pacing Pulse Width: 0.4 ms
Lead Channel Setting Pacing Pulse Width: 1.5 ms
Lead Channel Setting Sensing Sensitivity: 4 mV
Pulse Gen Model: 3222
Pulse Gen Serial Number: 9132853

## 2021-07-26 ENCOUNTER — Other Ambulatory Visit: Payer: Self-pay

## 2021-07-26 MED ORDER — CARVEDILOL 12.5 MG PO TABS: 12.5000 mg | ORAL_TABLET | Freq: Two times a day (BID) | ORAL | 2 refills | Status: DC

## 2021-08-04 NOTE — Progress Notes (Signed)
Remote pacemaker transmission.   

## 2021-08-20 ENCOUNTER — Telehealth: Payer: Self-pay | Admitting: Internal Medicine

## 2021-08-20 ENCOUNTER — Encounter (HOSPITAL_COMMUNITY): Payer: Self-pay

## 2021-08-20 ENCOUNTER — Other Ambulatory Visit: Payer: Self-pay

## 2021-08-20 ENCOUNTER — Emergency Department (HOSPITAL_COMMUNITY): Payer: Medicare HMO

## 2021-08-20 ENCOUNTER — Emergency Department (HOSPITAL_COMMUNITY)
Admission: EM | Admit: 2021-08-20 | Discharge: 2021-08-20 | Disposition: A | Discharge status: Home / Self Care | Payer: Medicare HMO | Attending: Emergency Medicine | Admitting: Emergency Medicine

## 2021-08-20 DIAGNOSIS — Z79899 Other long term (current) drug therapy: Secondary | ICD-10-CM | POA: Diagnosis not present

## 2021-08-20 DIAGNOSIS — F0390 Unspecified dementia without behavioral disturbance: Secondary | ICD-10-CM | POA: Diagnosis not present

## 2021-08-20 DIAGNOSIS — R451 Restlessness and agitation: Secondary | ICD-10-CM | POA: Diagnosis not present

## 2021-08-20 DIAGNOSIS — Z7901 Long term (current) use of anticoagulants: Secondary | ICD-10-CM | POA: Diagnosis not present

## 2021-08-20 DIAGNOSIS — R0789 Other chest pain: Secondary | ICD-10-CM | POA: Diagnosis present

## 2021-08-20 DIAGNOSIS — Z91040 Latex allergy status: Secondary | ICD-10-CM | POA: Insufficient documentation

## 2021-08-20 DIAGNOSIS — E039 Hypothyroidism, unspecified: Secondary | ICD-10-CM | POA: Insufficient documentation

## 2021-08-20 DIAGNOSIS — Z7984 Long term (current) use of oral hypoglycemic drugs: Secondary | ICD-10-CM | POA: Diagnosis not present

## 2021-08-20 DIAGNOSIS — I2510 Atherosclerotic heart disease of native coronary artery without angina pectoris: Secondary | ICD-10-CM | POA: Diagnosis not present

## 2021-08-20 DIAGNOSIS — I10 Essential (primary) hypertension: Secondary | ICD-10-CM | POA: Insufficient documentation

## 2021-08-20 DIAGNOSIS — E119 Type 2 diabetes mellitus without complications: Secondary | ICD-10-CM | POA: Diagnosis not present

## 2021-08-20 DIAGNOSIS — I4891 Unspecified atrial fibrillation: Secondary | ICD-10-CM | POA: Insufficient documentation

## 2021-08-20 LAB — CBC
HCT: 42.1 % (ref 36.0–46.0)
Hemoglobin: 14.1 g/dL (ref 12.0–15.0)
MCH: 32.1 pg (ref 26.0–34.0)
MCHC: 33.5 g/dL (ref 30.0–36.0)
MCV: 95.9 fL (ref 80.0–100.0)
Platelets: 202 10*3/uL (ref 150–400)
RBC: 4.39 MIL/uL (ref 3.87–5.11)
RDW: 13.1 % (ref 11.5–15.5)
WBC: 6.3 10*3/uL (ref 4.0–10.5)
nRBC: 0 % (ref 0.0–0.2)

## 2021-08-20 LAB — BASIC METABOLIC PANEL
Anion gap: 11 (ref 5–15)
BUN: 11 mg/dL (ref 8–23)
CO2: 24 mmol/L (ref 22–32)
Calcium: 8.7 mg/dL — ABNORMAL LOW (ref 8.9–10.3)
Chloride: 107 mmol/L (ref 98–111)
Creatinine, Ser: 0.95 mg/dL (ref 0.44–1.00)
GFR, Estimated: >60 mL/min (ref >60)
Glucose, Bld: 92 mg/dL (ref 70–99)
Potassium: 3.8 mmol/L (ref 3.5–5.1)
Sodium: 142 mmol/L (ref 135–145)

## 2021-08-20 LAB — TROPONIN I (HIGH SENSITIVITY)
Troponin I (High Sensitivity): 5 ng/L (ref <18)
Troponin I (High Sensitivity): 5 ng/L (ref <18)

## 2021-08-20 LAB — HEPATIC FUNCTION PANEL
ALT: 14 U/L (ref 0–44)
AST: 18 U/L (ref 15–41)
Albumin: 3.2 g/dL — ABNORMAL LOW (ref 3.5–5.0)
Alkaline Phosphatase: 54 U/L (ref 38–126)
Bilirubin, Direct: 0.2 mg/dL (ref 0.0–0.2)
Indirect Bilirubin: 0.6 mg/dL (ref 0.3–0.9)
Total Bilirubin: 0.8 mg/dL (ref 0.3–1.2)
Total Protein: 6 g/dL — ABNORMAL LOW (ref 6.5–8.1)

## 2021-08-20 LAB — LIPASE, BLOOD: Lipase: 33 U/L (ref 11–51)

## 2021-08-20 NOTE — Telephone Encounter (Signed)
Spoke with pt's daughter, DPR who reports pt is complaining of constant chest pain that started sometime this morning associated with nausea.  Pain is in the center of chest.  Pt is in the back ground moaning stating "it hurts"  Pt's daughter advised to hang up and call 911 now for further evaluation and transport.  Pt's daughter verbalizes understanding and agrees with recommendations.

## 2021-08-20 NOTE — ED Triage Notes (Signed)
Pt coming from home for CP unknown start time but today elephant sitting on her chest alert and oriented family called PCP first they advised her to come here. C/o nausea from drive here

## 2021-08-20 NOTE — Telephone Encounter (Signed)
Pt c/o of Chest Pain: STAT if CP now or developed within 24 hours  1. Are you having CP right now?  Yes she is hurting in the center of her chest  2. Are you experiencing any other symptoms (ex. SOB, nausea, vomiting, sweating)?   3. How long have you been experiencing CP? Started today  4. Is your CP continuous or coming and going?   5. Have you taken Nitroglycerin? no ?

## 2021-08-20 NOTE — ED Notes (Signed)
Pt verbalized understanding of d/c instructions, meds, and followup care. Denies questions. VSS, no distress noted. Steady gait to exit with all belongings.  ?

## 2021-08-20 NOTE — Discharge Instructions (Addendum)
Take the omeprazole daily for the next week.  Return if worse.

## 2021-08-20 NOTE — ED Provider Notes (Signed)
[facility] EMERGENCY DEPARTMENT Provider Note   CSN: [id number] Arrival date & time: 08/20/21  1505     History  Chief Complaint  Patient presents with   Chest Pain    Kristin Gomez is a 76 y.o. female.  Pt is a 76 yo female with a pmhx significant for fibromyalgia, dementia, htn, high cholesterol, cad, dm, cva, arthritis, afib (on Eliquis), hypothyroidism and chf.  Pt is a poor historian.  Hx obtained from EMS.  She had cp this am.  She was given something by her family which relieved pain.  Pt denies any pain now.  No sob.  She said she wants to go home.  Pt is allergic to ASA, so she was not given ASA by EMS.  Daughters are here now and able to give additional hx.  Pt's daughter said pt was pointing to the center of her chest and c/o it hurting.  Daughter gave her an antacid (omeprazole) which relieved her pain.        Home Medications Prior to Admission medications   Medication Sig Start Date End Date Taking? Authorizing Provider  albuterol (PROVENTIL HFA;VENTOLIN HFA) 108 (90 Base) MCG/ACT inhaler Inhale 2 puffs into the lungs every 6 (six) hours as needed for wheezing or shortness of breath.  11/20/12   [provider]  apixaban (ELIQUIS) 5 MG TABS tablet Take 1 tablet by mouth twice daily 06/03/21   Deboraha Sprang, MD  carvedilol (COREG) 12.5 MG tablet Take 1 tablet (12.5 mg total) by mouth 2 (two) times daily with a meal. 07/26/21   Deboraha Sprang, MD  Cholecalciferol (VITAMIN D3) 50 MCG (2000 UT) capsule Take 2,000 Units by mouth daily.     [provider]  diphenhydramine-acetaminophen (TYLENOL PM) 25-500 MG TABS tablet Take 1-2 tablets by mouth at bedtime as needed (pain).    [provider]  furosemide (LASIX) 20 MG tablet Take 1 tablet (20 mg total) by mouth daily. May take additional tablet as needed for weight gain of 2 pounds overnight or 5 pounds in one week. 07/15/21   Loel Dubonnet, NP  glipiZIDE (GLUCOTROL XL) 5 MG  24 hr tablet Take 5 mg by mouth at bedtime.    [provider]  ipratropium-albuterol (DUONEB) 0.5-2.5 (3) MG/3ML SOLN Take 3 mLs by nebulization every 6 (six) hours as needed (for shortness of breath or wheezing).  07/04/17   [provider]  levothyroxine (SYNTHROID) 25 MCG tablet Take 25 mcg by mouth daily before breakfast.    [provider]  memantine (NAMENDA) 10 MG tablet Take 1 tablet by mouth twice daily 06/21/21   Suzzanne Cloud, NP  methylPREDNISolone (MEDROL) 4 MG tablet Take 4 mg by mouth as needed (Fibermyalgia).  08/16/18   [provider]  omeprazole (PRILOSEC) 20 MG capsule Take 20 mg by mouth daily as needed (acid reflux).     [provider]  potassium chloride SA (K-DUR,KLOR-CON) 20 MEQ tablet Take 20 mEq by mouth daily.    [provider]  rosuvastatin (CRESTOR) 20 MG tablet Take 1 tablet (20 mg total) by mouth daily. 07/15/21   Loel Dubonnet, NP      Allergies    Aspirin, Azithromycin, Infliximab, Methotrexate, Mushroom extract complex, Nsaids, Avelox [moxifloxacin hcl in nacl], Diovan [valsartan], Donepezil, Donepezil hcl, Moxifloxacin, Other, Oxycodone-acetaminophen, Amoxicillin, Atorvastatin, Contrast media [iodinated contrast media], Iodine, Latex, Metformin, Metformin and related, Penicillins, Percocet [oxycodone-acetaminophen], Prednisone, Propofol, Sulfa antibiotics, and Vicodin [hydrocodone-acetaminophen]  Review of Systems   Review of Systems  Cardiovascular:  Positive for chest pain.  All other systems reviewed and are negative.   Physical Exam Updated Vital Signs BP 113/68   Pulse 65   Temp 98.5 F (36.9 C) (Oral)   Resp 14   Ht '5\' 4"'$  (1.626 m)   Wt 68.2 kg   SpO2 96%   BMI 25.81 kg/m  Physical Exam Vitals and nursing note reviewed.  Constitutional:      Appearance: She is well-developed.  HENT:     Head: Normocephalic and atraumatic.  Eyes:     Extraocular Movements: Extraocular movements  intact.     Pupils: Pupils are equal, round, and reactive to light.  Cardiovascular:     Rate and Rhythm: Normal rate and regular rhythm.     Heart sounds: Normal heart sounds.  Pulmonary:     Effort: Pulmonary effort is normal.     Breath sounds: Normal breath sounds.  Abdominal:     General: Bowel sounds are normal.     Palpations: Abdomen is soft.  Musculoskeletal:        General: Normal range of motion.     Cervical back: Normal range of motion and neck supple.  Skin:    General: Skin is warm.     Capillary Refill: Capillary refill takes less than 2 seconds.  Neurological:     General: No focal deficit present.     Mental Status: She is alert. Mental status is at baseline.  Psychiatric:        Behavior: Behavior is agitated.     ED Results / Procedures / Treatments   Labs (all labs ordered are listed, but only abnormal results are displayed) Labs Reviewed  BASIC METABOLIC PANEL - Abnormal; Notable for the following components:      Result Value   Calcium 8.7 (*)    All other components within normal limits  HEPATIC FUNCTION PANEL - Abnormal; Notable for the following components:   Total Protein 6.0 (*)    Albumin 3.2 (*)    All other components within normal limits  CBC  LIPASE, BLOOD  URINALYSIS, ROUTINE W REFLEX MICROSCOPIC  TROPONIN I (HIGH SENSITIVITY)  TROPONIN I (HIGH SENSITIVITY)    EKG EKG Interpretation  Date/Time:  Friday August 20 2021 15:14:13 EDT Ventricular Rate:  71 PR Interval:  68 QRS Duration: 148 QT Interval:  462 QTC Calculation: 503 R Axis:   -71 Text Interpretation: Sinus rhythm Short PR interval Left bundle branch block No significant change since last tracing Confirmed by Isla Pence [phone]) on 08/20/2021 3:33:50 PM  Radiology CT Head Wo Contrast  Result Date: 08/20/2021 CLINICAL DATA:  Mental status change. EXAM: CT HEAD WITHOUT CONTRAST TECHNIQUE: Contiguous axial images were obtained from the base of the skull through the vertex  without intravenous contrast. RADIATION DOSE REDUCTION: This exam was performed according to the departmental dose-optimization program which includes automated exposure control, adjustment of the mA and/or kV according to patient size and/or use of iterative reconstruction technique. COMPARISON:  CT head dated Jul 06, 2018 FINDINGS: Brain: No evidence of acute infarction, hemorrhage, hydrocephalus, extra-axial collection or mass lesion/mass effect. Encephalomalacia from prior right occipital lobe, left cerebellum and right caudate head consistent with old infarcts, unchanged. Vascular: No hyperdense vessel or unexpected calcification. Skull: Normal. Negative for fracture or focal lesion. Sinuses/Orbits: No acute finding.  Bilateral cataract surgery. Other: None. IMPRESSION: 1.  No acute intracranial abnormality. 2. 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 Electronically Signed   By: Keane Police D.O.   On: 08/20/2021 17:46   DG Chest Port 1 View  Result Date: 08/20/2021 CLINICAL DATA:  Chest pain, feels like LF and sitting on her chest EXAM: PORTABLE CHEST 1 VIEW COMPARISON:  Portable exam 1539 hours compared to 04/23/2021 FINDINGS: LEFT subclavian pacemaker with leads projecting over RIGHT atrium, RIGHT ventricle, and coronary sinus. Enlargement of cardiac silhouette with atherosclerotic calcification aorta. Mediastinal contours and pulmonary vascularity normal. Atelectasis versus infiltrate LEFT lower lobe. Remaining lungs clear. Chronic elevation RIGHT diaphragm. No pleural effusion or pneumothorax. Bones demineralized. IMPRESSION: Enlargement of cardiac silhouette post pacemaker. Atelectasis versus consolidation in LEFT lower lobe. Aortic Atherosclerosis (ICD10-I70.0). Electronically Signed   By: Lavonia Dana M.D.   On: 08/20/2021 15:55    Procedures Procedures    Medications Ordered in ED Medications - No data to display  ED Course/ Medical  Decision Making/ A&P                           Medical Decision Making Amount and/or Complexity of Data Reviewed Labs: ordered. Radiology: ordered.   This patient presents to the ED for concern of cp, this involves an extensive number of treatment options, and is a complaint that carries with it a high risk of complications and morbidity.  The differential diagnosis includes cardiac, pulm, gi, msk   Co morbidities that complicate the patient evaluation   fibromyalgia, dementia, htn, high cholesterol, cad, dm, cva, arthritis, afib (on Eliquis), hypothyroidism and chf   Additional history obtained:  Additional history obtained from epic chart review External records from outside source obtained and reviewed including EMS report   Lab Tests:  I Ordered, and personally interpreted labs.  The pertinent results include:  bmp nl, lfts nl, trop nl times 2, cbc nl   Imaging Studies ordered:  I ordered imaging studies including cxr and ct head I independently visualized and interpreted imaging which showed  CXR: IMPRESSION:  Enlargement of cardiac silhouette post pacemaker.    Atelectasis versus consolidation in LEFT lower lobe.    Aortic Atherosclerosis (ICD10-I70.0).  CT headL IMPRESSION:  1.  No acute intracranial abnormality.    2. Chronic infarcts of the right caudate head, right occipital lobe  and left cerebellum, unchanged.   I agree with the radiologist interpretation   Cardiac Monitoring:  The patient was maintained on a cardiac monitor.  I personally viewed and interpreted the cardiac monitored which showed an underlying rhythm of: nsr   Medicines ordered and prescription drug management:   I have reviewed the patients home medicines and have made adjustments as needed   Test Considered:  Cta, but no sob and no tachycardia.  Pt is compliant with Eliquis.   Critical Interventions:  Ekg,trop    Problem List / ED Course:  CP:  atypical.  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 Work up today neg for cad.  Pt has had no further cp since she took the ppi. Pt is to take her omeprazole daily for the next week (family has been giving it prn).   Reevaluation:  After the interventions noted above, I reevaluated the patient and found that they have :improved   Social Determinants of Health:  Lives at home with family   Dispostion:  After consideration of the diagnostic results and the patients response to treatment, I feel that the patent would benefit from discharge with outpatient f/u.  Final Clinical Impression(s) / ED Diagnoses Final diagnoses:  Atypical chest pain    Rx / DC Orders ED Discharge Orders     None         Isla Pence, MD 08/20/21 1925

## 2021-09-10 ENCOUNTER — Other Ambulatory Visit: Payer: Self-pay | Admitting: Neurology

## 2021-10-19 ENCOUNTER — Ambulatory Visit (INDEPENDENT_AMBULATORY_CARE_PROVIDER_SITE_OTHER): Payer: Medicare HMO

## 2021-10-19 DIAGNOSIS — I428 Other cardiomyopathies: Secondary | ICD-10-CM | POA: Diagnosis not present

## 2021-10-19 LAB — CUP PACEART REMOTE DEVICE CHECK
Battery Remaining Longevity: 44 mo
Battery Remaining Percentage: 54 %
Battery Voltage: 2.96 V
Brady Statistic AP VP Percent: 25 %
Brady Statistic AP VS Percent: 1 %
Brady Statistic AS VP Percent: 75 %
Brady Statistic AS VS Percent: 1 %
Brady Statistic RA Percent Paced: 24 %
Date Time Interrogation Session: 20230905023434
Implantable Lead Implant Date: 20081110
Implantable Lead Implant Date: 20081110
Implantable Lead Implant Date: 20081110
Implantable Lead Location: 753858
Implantable Lead Location: 753859
Implantable Lead Location: 753860
Implantable Pulse Generator Implant Date: 20201204
Lead Channel Impedance Value: 460 Ohm
Lead Channel Impedance Value: 480 Ohm
Lead Channel Impedance Value: 890 Ohm
Lead Channel Pacing Threshold Amplitude: 0.625 V
Lead Channel Pacing Threshold Amplitude: 1.125 V
Lead Channel Pacing Threshold Amplitude: 2 V
Lead Channel Pacing Threshold Pulse Width: 0.4 ms
Lead Channel Pacing Threshold Pulse Width: 0.4 ms
Lead Channel Pacing Threshold Pulse Width: 1.5 ms
Lead Channel Sensing Intrinsic Amplitude: 12 mV
Lead Channel Sensing Intrinsic Amplitude: 2.1 mV
Lead Channel Setting Pacing Amplitude: 1.625
Lead Channel Setting Pacing Amplitude: 2.125
Lead Channel Setting Pacing Amplitude: 2.5 V
Lead Channel Setting Pacing Pulse Width: 0.4 ms
Lead Channel Setting Pacing Pulse Width: 1.5 ms
Lead Channel Setting Sensing Sensitivity: 4 mV
Pulse Gen Model: 3222
Pulse Gen Serial Number: 9132853

## 2021-11-12 NOTE — Progress Notes (Signed)
Remote pacemaker transmission.   

## 2021-11-18 ENCOUNTER — Encounter (HOSPITAL_COMMUNITY): Payer: Self-pay

## 2021-11-18 ENCOUNTER — Telehealth: Payer: Self-pay | Admitting: Physician Assistant

## 2021-11-18 ENCOUNTER — Observation Stay (HOSPITAL_COMMUNITY)
Admission: EM | Admit: 2021-11-18 | Discharge: 2021-11-19 | Disposition: A | Discharge status: Home Health | Payer: Medicare HMO | Attending: Internal Medicine | Admitting: Internal Medicine

## 2021-11-18 ENCOUNTER — Emergency Department (HOSPITAL_COMMUNITY): Payer: Medicare HMO

## 2021-11-18 ENCOUNTER — Other Ambulatory Visit: Payer: Self-pay

## 2021-11-18 DIAGNOSIS — R197 Diarrhea, unspecified: Secondary | ICD-10-CM | POA: Insufficient documentation

## 2021-11-18 DIAGNOSIS — E119 Type 2 diabetes mellitus without complications: Secondary | ICD-10-CM | POA: Diagnosis not present

## 2021-11-18 DIAGNOSIS — Z8673 Personal history of transient ischemic attack (TIA), and cerebral infarction without residual deficits: Secondary | ICD-10-CM | POA: Diagnosis not present

## 2021-11-18 DIAGNOSIS — Z79899 Other long term (current) drug therapy: Secondary | ICD-10-CM | POA: Insufficient documentation

## 2021-11-18 DIAGNOSIS — Z91040 Latex allergy status: Secondary | ICD-10-CM | POA: Insufficient documentation

## 2021-11-18 DIAGNOSIS — R112 Nausea with vomiting, unspecified: Secondary | ICD-10-CM | POA: Diagnosis present

## 2021-11-18 DIAGNOSIS — I10 Essential (primary) hypertension: Secondary | ICD-10-CM | POA: Diagnosis present

## 2021-11-18 DIAGNOSIS — R6520 Severe sepsis without septic shock: Secondary | ICD-10-CM | POA: Diagnosis not present

## 2021-11-18 DIAGNOSIS — I5043 Acute on chronic combined systolic (congestive) and diastolic (congestive) heart failure: Secondary | ICD-10-CM | POA: Diagnosis not present

## 2021-11-18 DIAGNOSIS — Z7984 Long term (current) use of oral hypoglycemic drugs: Secondary | ICD-10-CM | POA: Insufficient documentation

## 2021-11-18 DIAGNOSIS — F0390 Unspecified dementia without behavioral disturbance: Secondary | ICD-10-CM | POA: Diagnosis not present

## 2021-11-18 DIAGNOSIS — I480 Paroxysmal atrial fibrillation: Secondary | ICD-10-CM | POA: Diagnosis present

## 2021-11-18 DIAGNOSIS — I509 Heart failure, unspecified: Secondary | ICD-10-CM

## 2021-11-18 DIAGNOSIS — U071 COVID-19: Secondary | ICD-10-CM | POA: Diagnosis not present

## 2021-11-18 DIAGNOSIS — I110 Hypertensive heart disease with heart failure: Secondary | ICD-10-CM | POA: Insufficient documentation

## 2021-11-18 DIAGNOSIS — E039 Hypothyroidism, unspecified: Secondary | ICD-10-CM | POA: Diagnosis not present

## 2021-11-18 DIAGNOSIS — Z950 Presence of cardiac pacemaker: Secondary | ICD-10-CM | POA: Diagnosis not present

## 2021-11-18 DIAGNOSIS — A419 Sepsis, unspecified organism: Secondary | ICD-10-CM | POA: Insufficient documentation

## 2021-11-18 DIAGNOSIS — M6281 Muscle weakness (generalized): Secondary | ICD-10-CM | POA: Insufficient documentation

## 2021-11-18 DIAGNOSIS — N179 Acute kidney failure, unspecified: Secondary | ICD-10-CM | POA: Diagnosis not present

## 2021-11-18 DIAGNOSIS — J45909 Unspecified asthma, uncomplicated: Secondary | ICD-10-CM | POA: Insufficient documentation

## 2021-11-18 DIAGNOSIS — Z7901 Long term (current) use of anticoagulants: Secondary | ICD-10-CM | POA: Diagnosis not present

## 2021-11-18 LAB — URINALYSIS, ROUTINE W REFLEX MICROSCOPIC
Bilirubin Urine: NEGATIVE
Glucose, UA: NEGATIVE mg/dL
Hgb urine dipstick: NEGATIVE
Ketones, ur: NEGATIVE mg/dL
Leukocytes,Ua: NEGATIVE
Nitrite: NEGATIVE
Protein, ur: 30 mg/dL — AB
Specific Gravity, Urine: 1.021 (ref 1.005–1.030)
pH: 5 (ref 5.0–8.0)

## 2021-11-18 LAB — COMPREHENSIVE METABOLIC PANEL
ALT: 32 U/L (ref 0–44)
AST: 41 U/L (ref 15–41)
Albumin: 3.1 g/dL — ABNORMAL LOW (ref 3.5–5.0)
Alkaline Phosphatase: 42 U/L (ref 38–126)
Anion gap: 10 (ref 5–15)
BUN: 25 mg/dL — ABNORMAL HIGH (ref 8–23)
CO2: 21 mmol/L — ABNORMAL LOW (ref 22–32)
Calcium: 7.9 mg/dL — ABNORMAL LOW (ref 8.9–10.3)
Chloride: 105 mmol/L (ref 98–111)
Creatinine, Ser: 1.76 mg/dL — ABNORMAL HIGH (ref 0.44–1.00)
GFR, Estimated: 30 mL/min — ABNORMAL LOW (ref >60)
Glucose, Bld: 77 mg/dL (ref 70–99)
Potassium: 3.5 mmol/L (ref 3.5–5.1)
Sodium: 136 mmol/L (ref 135–145)
Total Bilirubin: 0.7 mg/dL (ref 0.3–1.2)
Total Protein: 5.8 g/dL — ABNORMAL LOW (ref 6.5–8.1)

## 2021-11-18 LAB — CBC WITH DIFFERENTIAL/PLATELET
Abs Immature Granulocytes: 0.01 10*3/uL (ref 0.00–0.07)
Basophils Absolute: 0 10*3/uL (ref 0.0–0.1)
Basophils Relative: 0 %
Eosinophils Absolute: 0 10*3/uL (ref 0.0–0.5)
Eosinophils Relative: 0 %
HCT: 42.7 % (ref 36.0–46.0)
Hemoglobin: 14.2 g/dL (ref 12.0–15.0)
Immature Granulocytes: 0 %
Lymphocytes Relative: 28 %
Lymphs Abs: 1.5 10*3/uL (ref 0.7–4.0)
MCH: 32 pg (ref 26.0–34.0)
MCHC: 33.3 g/dL (ref 30.0–36.0)
MCV: 96.2 fL (ref 80.0–100.0)
Monocytes Absolute: 0.6 10*3/uL (ref 0.1–1.0)
Monocytes Relative: 11 %
Neutro Abs: 3.2 10*3/uL (ref 1.7–7.7)
Neutrophils Relative %: 61 %
Platelets: 122 10*3/uL — ABNORMAL LOW (ref 150–400)
RBC: 4.44 MIL/uL (ref 3.87–5.11)
RDW: 13.6 % (ref 11.5–15.5)
WBC: 5.3 10*3/uL (ref 4.0–10.5)
nRBC: 0 % (ref 0.0–0.2)

## 2021-11-18 LAB — LIPASE, BLOOD: Lipase: 43 U/L (ref 11–51)

## 2021-11-18 LAB — CBG MONITORING, ED
Glucose-Capillary: 73 mg/dL (ref 70–99)
Glucose-Capillary: 84 mg/dL (ref 70–99)
Glucose-Capillary: 76 mg/dL (ref 70–99)

## 2021-11-18 LAB — SARS CORONAVIRUS 2 BY RT PCR: SARS Coronavirus 2 by RT PCR: POSITIVE — AB

## 2021-11-18 LAB — PROTIME-INR
INR: 1.4 — ABNORMAL HIGH (ref 0.8–1.2)
Prothrombin Time: 17.4 seconds — ABNORMAL HIGH (ref 11.4–15.2)

## 2021-11-18 LAB — LACTIC ACID, PLASMA: Lactic Acid, Venous: 1.7 mmol/L (ref 0.5–1.9)

## 2021-11-18 MED ORDER — ONDANSETRON HCL 4 MG/2ML IJ SOLN
4.0000 mg | Freq: Four times a day (QID) | INTRAMUSCULAR | Status: DC | PRN
  Administered 2021-11-18: 4 mg via INTRAVENOUS
  Filled 2021-11-18: qty 2

## 2021-11-18 MED ORDER — MEMANTINE HCL 10 MG PO TABS
10.0000 mg | ORAL_TABLET | Freq: Two times a day (BID) | ORAL | Status: DC
  Administered 2021-11-18 – 2021-11-19 (×2): 10 mg via ORAL
  Filled 2021-11-18 (×3): qty 1

## 2021-11-18 MED ORDER — APIXABAN 5 MG PO TABS
5.0000 mg | ORAL_TABLET | Freq: Two times a day (BID) | ORAL | Status: DC
  Administered 2021-11-18 – 2021-11-19 (×2): 5 mg via ORAL
  Filled 2021-11-18 (×2): qty 1

## 2021-11-18 MED ORDER — ONDANSETRON HCL 4 MG PO TABS: 4.0000 mg | ORAL_TABLET | Freq: Four times a day (QID) | ORAL | Status: DC | PRN

## 2021-11-18 MED ORDER — LEVOTHYROXINE SODIUM 25 MCG PO TABS: 25.0000 ug | ORAL_TABLET | Freq: Every day | ORAL | Status: DC

## 2021-11-18 MED ORDER — ONDANSETRON HCL 4 MG/2ML IJ SOLN
4.0000 mg | Freq: Once | INTRAMUSCULAR | Status: AC
  Administered 2021-11-18: 4 mg via INTRAVENOUS
  Filled 2021-11-18: qty 2

## 2021-11-18 MED ORDER — SODIUM CHLORIDE 0.9 % IV BOLUS
1000.0000 mL | Freq: Once | INTRAVENOUS | Status: AC
  Administered 2021-11-18: 1000 mL via INTRAVENOUS

## 2021-11-18 MED ORDER — POTASSIUM CHLORIDE CRYS ER 20 MEQ PO TBCR
40.0000 meq | EXTENDED_RELEASE_TABLET | Freq: Once | ORAL | Status: AC
  Administered 2021-11-18: 40 meq via ORAL
  Filled 2021-11-18: qty 2

## 2021-11-18 MED ORDER — SODIUM CHLORIDE 0.9 % IV SOLN: INTRAVENOUS | Status: DC

## 2021-11-18 MED ORDER — METRONIDAZOLE 500 MG/100ML IV SOLN
500.0000 mg | Freq: Two times a day (BID) | INTRAVENOUS | Status: DC
  Administered 2021-11-18: 500 mg via INTRAVENOUS
  Filled 2021-11-18: qty 100

## 2021-11-18 MED ORDER — CLONAZEPAM 0.5 MG PO TABS
0.5000 mg | ORAL_TABLET | Freq: Two times a day (BID) | ORAL | Status: DC | PRN
  Administered 2021-11-18: 0.5 mg via ORAL
  Filled 2021-11-18: qty 1

## 2021-11-18 MED ORDER — SODIUM CHLORIDE 0.9 % IV SOLN
2.0000 g | INTRAVENOUS | Status: DC
  Administered 2021-11-18: 2 g via INTRAVENOUS
  Filled 2021-11-18: qty 20

## 2021-11-18 MED ORDER — INSULIN ASPART 100 UNIT/ML IJ SOLN: 0.0000 [IU] | Freq: Three times a day (TID) | INTRAMUSCULAR | Status: DC

## 2021-11-18 MED ORDER — LEVOTHYROXINE SODIUM 25 MCG PO TABS
25.0000 ug | ORAL_TABLET | Freq: Every day | ORAL | Status: DC
  Administered 2021-11-19: 25 ug via ORAL
  Filled 2021-11-18: qty 1

## 2021-11-18 MED ORDER — ACETAMINOPHEN 325 MG PO TABS
650.0000 mg | ORAL_TABLET | Freq: Four times a day (QID) | ORAL | Status: DC | PRN
  Administered 2021-11-18: 650 mg via ORAL
  Filled 2021-11-18: qty 2

## 2021-11-18 MED ORDER — MOLNUPIRAVIR EUA 200MG CAPSULE
4.0000 | ORAL_CAPSULE | Freq: Two times a day (BID) | ORAL | Status: DC
  Administered 2021-11-19 (×2): 800 mg via ORAL
  Filled 2021-11-18: qty 4

## 2021-11-18 MED ORDER — ROSUVASTATIN CALCIUM 20 MG PO TABS
20.0000 mg | ORAL_TABLET | Freq: Every day | ORAL | Status: DC
  Administered 2021-11-19: 20 mg via ORAL
  Filled 2021-11-18: qty 1

## 2021-11-18 MED ORDER — ALBUTEROL SULFATE HFA 108 (90 BASE) MCG/ACT IN AERS: 2.0000 | INHALATION_SPRAY | Freq: Four times a day (QID) | RESPIRATORY_TRACT | Status: DC | PRN

## 2021-11-18 MED ORDER — SODIUM CHLORIDE 0.9 % IV SOLN: Freq: Once | INTRAVENOUS | Status: DC

## 2021-11-18 MED ORDER — PANTOPRAZOLE SODIUM 40 MG PO TBEC
40.0000 mg | DELAYED_RELEASE_TABLET | Freq: Every day | ORAL | Status: DC
  Administered 2021-11-19: 40 mg via ORAL
  Filled 2021-11-18: qty 1

## 2021-11-18 MED ORDER — VITAMIN D 25 MCG (1000 UNIT) PO TABS
2000.0000 [IU] | ORAL_TABLET | Freq: Every day | ORAL | Status: DC
  Administered 2021-11-19: 2000 [IU] via ORAL
  Filled 2021-11-18: qty 2

## 2021-11-18 MED ORDER — INSULIN ASPART 100 UNIT/ML IJ SOLN: 0.0000 [IU] | Freq: Every day | INTRAMUSCULAR | Status: DC

## 2021-11-18 MED ORDER — IPRATROPIUM-ALBUTEROL 0.5-2.5 (3) MG/3ML IN SOLN: 3.0000 mL | Freq: Four times a day (QID) | RESPIRATORY_TRACT | Status: DC | PRN

## 2021-11-18 MED ORDER — LORAZEPAM 2 MG/ML IJ SOLN: 0.5000 mg | Freq: Once | INTRAMUSCULAR | Status: DC | PRN

## 2021-11-18 NOTE — H&P (Signed)
TRH H&P   Patient Demographics:    Kristin Gomez, is a 76 y.o. female  MRN: [id number]   DOB - 01-15-1946  Admit Date - 11/18/2021  Outpatient Primary MD for the patient is Hague, Rosalyn Charters, MD  Referring MD/NP/PA: Dr Sanda Linger  Patient coming from: Home  Chief Complaint  Patient presents with   Abdominal Pain   Fever      HPI:    Kristin Gomez  is a 76 y.o. female, with history of chronic systolic heart failure status post biventricular pacemaker placement nonischemic cardiomyopathy, CVA, hyperlipidemia, dementia, hypothyroidism,  -Patient presents to ED secondary to complaints of abdominal pain, nausea, vomiting, diarrhea and fever for last 3 days, patient complains of generalized abdominal pain, nausea, vomiting, diarrhea, soft stool, not watery, but multiple episodes, as well poor oral intake, patient with dementia, daughter at bedside assist with history, no recent antibiotics, no hematemesis, no bright red blood per rectum, no obvious sick contacts or suspicious food intake, she denies dysuria, cough, dyspnea. -In ED CT abdomen pelvis with no acute findings, here UA significant for mild pyuria, labs significant for elevated creatinine at 1.7, her work-up significant for positive COVID-19 infection, AKI, [facility] hospitalist consulted to admit.    Review of systems:      A full 10 point Review of Systems was done, except as stated above, all other Review of Systems were negative.   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[place])    Stroke ([place]) 02/2017   "affected my memory" (09/26/2017)   Type II diabetes mellitus ([place])       Past Surgical History:  Procedure Laterality Date   CARDIAC CATHETERIZATION     CATARACT EXTRACTION W/ INTRAOCULAR LENS  IMPLANT, BILATERAL Bilateral    CHOLECYSTECTOMY     COLONOSCOPY     I & D EXTREMITY  03/31/2011   Procedure: IRRIGATION AND DEBRIDEMENT EXTREMITY;  Surgeon: Tennis Must, MD;  Location: [facility];  Service: Orthopedics;  Laterality: Left;  left long finger   INSERT / REPLACE / REMOVE PACEMAKER     KNEE ARTHROSCOPY Left    MASS EXCISION  03/08/2011   Procedure: EXCISION MASS;  Surgeon: Tennis Must, MD;  Location: [facility];  Service: Orthopedics;  Laterality: Left;  Excision Mass Left Long Finger and Debridement of Distal Interphalangeal  Joint   PPM GENERATOR CHANGEOUT  N/A 01/18/2019   Procedure: PPM GENERATOR CHANGEOUT;  Surgeon: Deboraha Sprang, MD;  Location: [facility];  Service: Cardiovascular;  Laterality: N/A;   TONSILLECTOMY     TOTAL ABDOMINAL HYSTERECTOMY     TUBAL LIGATION        Social History:     Social History   Tobacco Use   Smoking status: Never   Smokeless tobacco: Never  Substance Use Topics   Alcohol use: Never       Family History :     Family History  Problem Relation Age of Onset   Alzheimer's disease Mother    Heart attack Father    Diabetes Brother      Home Medications:   Prior to Admission medications   Medication Sig Start Date End Date Taking? Authorizing Provider  albuterol (PROVENTIL HFA;VENTOLIN HFA) 108 (90 Base) MCG/ACT inhaler Inhale 2 puffs into the lungs every 6 (six) hours as needed for wheezing or shortness of breath.  11/20/12  Yes [provider]  apixaban (ELIQUIS) 5 MG TABS tablet Take 1 tablet by mouth twice daily 06/03/21  Yes Deboraha Sprang, MD  carvedilol (COREG) 12.5 MG tablet Take 1 tablet (12.5 mg total) by  mouth 2 (two) times daily with a meal. 07/26/21  Yes Deboraha Sprang, MD  Cholecalciferol (VITAMIN D3) 50 MCG (2000 UT) capsule Take 2,000 Units by mouth daily.    Yes [provider]  clonazePAM (KLONOPIN) 0.5 MG tablet Take 0.5 mg by mouth 2 (two) times daily as needed. 10/06/21  Yes [provider]  diphenhydramine-acetaminophen (TYLENOL PM) 25-500 MG TABS tablet Take 1-2 tablets by mouth at bedtime as needed (pain).   Yes [provider]  furosemide (LASIX) 20 MG tablet Take 1 tablet (20 mg total) by mouth daily. May take additional tablet as needed for weight gain of 2 pounds overnight or 5 pounds in one week. 07/15/21  Yes Loel Dubonnet, NP  glipiZIDE (GLUCOTROL XL) 5 MG 24 hr tablet Take 5 mg by mouth at bedtime.   Yes [provider]  Infant Care Products [facility]) OINT Apply 1 Application topically daily as needed (inflammation).   Yes [provider]  ipratropium-albuterol (DUONEB) 0.5-2.5 (3) MG/3ML SOLN Take 3 mLs by nebulization every 6 (six) hours as needed (for shortness of breath or wheezing).  07/04/17  Yes [provider]  levothyroxine (SYNTHROID) 25 MCG tablet Take 25 mcg by mouth daily before breakfast.   Yes [provider]  memantine (NAMENDA) 10 MG tablet Take 1 tablet by mouth twice daily 09/14/21  Yes Suzzanne Cloud, NP  methylPREDNISolone (MEDROL) 4 MG tablet Take 1 mg by mouth as needed (Fibermyalgia). 08/16/18  Yes [provider]  omeprazole (PRILOSEC) 20 MG capsule Take 20 mg by mouth daily as needed (acid reflux).    Yes [provider]  potassium chloride SA (K-DUR,KLOR-CON) 20 MEQ tablet Take 20 mEq by mouth daily.   Yes [provider]  rosuvastatin (CRESTOR) 20 MG tablet Take 1 tablet (20 mg total) by mouth daily. 07/15/21  Yes Loel Dubonnet, NP     Allergies:     Allergies  Allergen Reactions   Aspirin Shortness Of Breath and Other (See Comments)    Patient states that  this caused an asthma attack Patient states that is causes an asthma attack.   Azithromycin Anaphylaxis   Infliximab Hives    remicade   Methotrexate Hives   Mushroom Extract Complex Anaphylaxis, Shortness  Of Breath and Swelling   Nsaids Shortness Of Breath and Other (See Comments)    Asthma attack Asthma attack   Avelox [Moxifloxacin Hcl In Nacl] Swelling    Site of swelling not recalled   Diovan [Valsartan] Swelling    Site of swelling not recalled   Donepezil Diarrhea   Donepezil Hcl Other (See Comments)    Diarrhea Diarrhea   Moxifloxacin Swelling   Other Swelling    Site of swelling not recalled   Oxycodone-Acetaminophen Nausea Only   Amoxicillin Rash   Atorvastatin Swelling and Other (See Comments)    Exacerbated Psoriatic arthritis    Contrast Media [Iodinated Contrast Media] Rash   Iodine Rash   Latex Rash   Metformin Rash    ??   Metformin And Related Rash    ??   Penicillins Rash    Has patient had a PCN reaction causing immediate rash, facial/tongue/throat swelling, SOB or lightheadedness with hypotension: Yes Has patient had a PCN reaction causing severe rash involving mucus membranes or skin necrosis: No Has patient had a PCN reaction that required hospitalization: No Has patient had a PCN reaction occurring within the last 10 years: No If all of the above answers are "NO", then may proceed with Cephalosporin use.    Percocet [Oxycodone-Acetaminophen] Nausea Only   Prednisone Rash and Other (See Comments)    Can only be tolerated with Benadryl HAS TO TAKE BENADRYL WITH PREDNISONE   Propofol Rash   Sulfa Antibiotics Nausea Only and Rash   Vicodin [Hydrocodone-Acetaminophen] Nausea Only     Physical Exam:   Vitals  Blood pressure 112/60, pulse 80, temperature 98.5 F (36.9 C), temperature source Oral, resp. rate (!) 28, height '5\' 4"'$  (1.626 m), weight 68 kg, SpO2 94 %.   1. General frail elderly female, laying in bed, in mild discomfort  2.   Demented,, Not Suicidal or Homicidal, Awake Alert, Oriented X 2.  3. No F.N deficits, ALL C.Nerves Intact, Strength 5/5 all 4 extremities, Sensation intact all 4 extremities, Plantars down going.  4. Ears and Eyes appear Normal, Conjunctivae clear, PERRLA.  Dry oral Mucosa.  5. Supple Neck, No JVD, No cervical lymphadenopathy appriciated, No Carotid Bruits.  6. Symmetrical Chest wall movement, Good air movement bilaterally, CTAB.  7. RRR, No Gallops, Rubs or Murmurs, No Parasternal Heave.  8. Positive Bowel Sounds, Abdomen Soft, mild diffuse tenderness to palpation, No organomegaly appriciated,No rebound -guarding or rigidity.  9.  No Cyanosis, delayed skin Turgor, No Skin Rash or Bruise.  10. Good muscle tone,  joints appear normal , no effusions, Normal ROM.  11. No Palpable Lymph Nodes in Neck or Axillae     Data Review:    CBC Recent Labs  Lab 11/18/21 1333  WBC 5.3  HGB 14.2  HCT 42.7  PLT 122*  MCV 96.2  MCH 32.0  MCHC 33.3  RDW 13.6  LYMPHSABS 1.5  MONOABS 0.6  EOSABS 0.0  BASOSABS 0.0   ------------------------------------------------------------------------------------------------------------------  Chemistries  Recent Labs  Lab 11/18/21 1333  NA 136  K 3.5  CL 105  CO2 21*  GLUCOSE 77  BUN 25*  CREATININE 1.76*  CALCIUM 7.9*  AST 41  ALT 32  ALKPHOS 42  BILITOT 0.7   ------------------------------------------------------------------------------------------------------------------ estimated creatinine clearance is 25.8 mL/min (A) (by C-G formula based on SCr of 1.76 mg/dL (H)). ------------------------------------------------------------------------------------------------------------------ No results for input(s): "TSH", "T4TOTAL", "T3FREE", "THYROIDAB" in the last 72 hours.  Invalid input(s): "FREET3"  Coagulation profile Recent Labs  Lab 11/18/21 1333  INR 1.4*    ------------------------------------------------------------------------------------------------------------------- No results for input(s): "DDIMER" in the last 72 hours. -------------------------------------------------------------------------------------------------------------------  Cardiac Enzymes No results for input(s): "CKMB", "TROPONINI", "MYOGLOBIN" in the last 168 hours.  Invalid input(s): "CK" ------------------------------------------------------------------------------------------------------------------ No results found for: "BNP"   ---------------------------------------------------------------------------------------------------------------  Urinalysis    Component Value Date/Time   COLORURINE YELLOW 11/18/2021 1629   APPEARANCEUR HAZY (A) 11/18/2021 1629   LABSPEC 1.021 11/18/2021 1629   PHURINE 5.0 11/18/2021 1629   GLUCOSEU NEGATIVE 11/18/2021 1629   HGBUR NEGATIVE 11/18/2021 1629   BILIRUBINUR NEGATIVE 11/18/2021 1629   KETONESUR NEGATIVE 11/18/2021 1629   PROTEINUR 30 (A) 11/18/2021 1629   UROBILINOGEN 0.2 09/30/2012 1812   NITRITE NEGATIVE 11/18/2021 1629   LEUKOCYTESUR NEGATIVE 11/18/2021 1629    ----------------------------------------------------------------------------------------------------------------   Imaging Results:    CT ABDOMEN PELVIS WO CONTRAST  Result Date: 11/18/2021 CLINICAL DATA:  Nausea, vomiting, abdominal pain EXAM: CT ABDOMEN AND PELVIS WITHOUT CONTRAST TECHNIQUE: Multidetector CT imaging of the abdomen and pelvis was performed following the standard protocol without IV contrast. Unenhanced CT was performed per clinician order. Lack of IV contrast limits sensitivity and specificity, especially for evaluation of abdominal/pelvic solid viscera. RADIATION DOSE REDUCTION: This exam was performed according to the departmental dose-optimization program which includes automated exposure control, adjustment of the mA and/or kV according  to patient size and/or use of iterative reconstruction technique. COMPARISON:  09/26/2017 FINDINGS: Lower chest: Dependent hypoventilatory changes within the lower lobes. Multi lead cardiac pacer. Coronary artery atherosclerosis. Hepatobiliary: Indeterminate subcentimeter hypodensities within the right lobe liver, largest measuring 5 mm image 20/3. Otherwise unremarkable unenhanced appearance of the liver. No biliary duct dilation. Previous cholecystectomy. Pancreas: Unremarkable unenhanced appearance. Spleen: Unremarkable unenhanced appearance. Adrenals/Urinary Tract: No urinary tract calculi or obstructive uropathy within either kidney. The adrenals and bladder are unremarkable. Stomach/Bowel: No bowel obstruction or ileus. No bowel wall thickening or inflammatory change. Vascular/Lymphatic: Aortic atherosclerosis. No enlarged abdominal or pelvic lymph nodes. Reproductive: Status post hysterectomy. No adnexal masses. Other: No free fluid or free intraperitoneal gas. No abdominal wall hernia. Musculoskeletal: No acute or destructive bony lesions. Chronic bilateral rib fractures. Chronic appearing compression deformities at T6, T7, and L1, with prior vertebral augmentation at the L1 level again noted. IMPRESSION: 1. No acute intra-abdominal or intrapelvic process. 2.  Aortic Atherosclerosis (ICD10-I70.0). Electronically Signed   By: Randa Ngo M.D.   On: 11/18/2021 15:06   DG Chest Portable 1 View  Result Date: 11/18/2021 CLINICAL DATA:  Fever and vomiting for 3 days in a 76 year old female. EXAM: PORTABLE CHEST 1 VIEW COMPARISON:  August 20, 2021 FINDINGS: EKG leads project over the chest. Multi lead pacer defibrillator remains in place, power pack over the LEFT chest. Cardiomediastinal contours and hilar structures are stable with signs of cardiac enlargement not well assessed due to low lung volumes. No signs of lobar consolidation. No pneumothorax. Fissural fluid in the RIGHT chest and mildly obscured RIGHT  hemidiaphragm. No gross pulmonary edema. Limited assessment of skeletal structures with potential midthoracic compression fracture that is new compared to recent imaging from July. Rib fractures of indeterminate age along the LEFT chest not clearly visible on previous imaging posterolateral seventh rib. IMPRESSION: 1. Rib fractures of indeterminate age along the LEFT chest not clearly visible on previous imaging from July. Correlate with point tenderness in this area. 2. Low lung volumes with fissural fluid in the RIGHT chest and mildly obscured RIGHT hemidiaphragm. Findings raise the question of small pleural effusion with associated airspace  disease. In the appropriate clinical setting this could represent RIGHT lower lobe pneumonia but is not well evaluated. 3. Potential midthoracic compression fracture that is new compared to recent imaging from July. Electronically Signed   By: Zetta Bills M.D.   On: 11/18/2021 14:43    My personal review of EKG: Paced rhythm  Assessment & Plan:    Principal Problem:   AKI (acute kidney injury) ([place]) Active Problems:   Hypertension   Diabetes mellitus type 2 in nonobese (HCC)   Chronic CHF (congestive heart failure) (HCC)   PAF (paroxysmal atrial fibrillation) (H[facility])   Biventricular cardiac pacemaker in situ   AKI -Due to volume depletion and dehydration in the setting of nausea, vomiting and poor oral intake as well as soft blood pressure -Continue with IV fluids and avoid nephrotoxic medications.  COVID-19 infection -No hypoxia, dyspnea, but she is high risk for decompensation given her age, facility, will start on molnupiravir -Given no hypoxia will hold on steroids -Continue with incentive spirometry and flutter valve  Hypertension>> hypotension -With known history of hypertension, will hold her medications given soft blood pressure, continue with IV fluids  Acute on chronic systolic/diastolic CHF -Patient with known history of CHF, most recent  echo in 2021 with EF 40 to 45% -Hold Lasix for now, will continue with IV fluids, will continue with close monitoring to avoid volume overload  Paroxysmal A-fib -She is currently with paced rhythm, will hold her home Cardizem and metoprolol, continue with Eliquis   History of CVA -Continue with Eliquis  Diabetes mellitus type 2  -We will hold glipizide and we will keep patient on sliding scale coverage.   Hypothyroidism -Continue with Synthroid   DVT Prophylaxis Eliquis  AM Labs Ordered, also please review Full Orders  Family Communication: Admission, patients condition and plan of care including tests being ordered have been discussed with the patient and daughter at bedside who indicate understanding and agree with the plan and Code Status.  Code Status DNR, confirmed by daughter at bedside  Likely DC to  Home  Condition GUARDED    Consults called: none    Admission status: observation    Time spent in minutes : 70 minutes   Phillips Climes M.D on 11/18/2021 at 5:32 PM   [facility] - Office  [phone]

## 2021-11-18 NOTE — ED Notes (Signed)
Spoke with attending and he is aware that pt has severe nausea and wishes not to take p.o. meds until nausea subsides. M.D. satisfied with plan to hold p.o. meds at this time.

## 2021-11-18 NOTE — ED Notes (Signed)
Pt becoming increasingly anxious about being in hospital. Pt requesting something to help her rest. PRN medication administered

## 2021-11-18 NOTE — ED Notes (Signed)
Pt daughter at bedside. PT pt daughter pt has frequent BM but BM are soft and solid. PT daughter denies pt having watery diarrhea.

## 2021-11-18 NOTE — ED Notes (Signed)
Pt daughter leaving at this time. Pt daughter requesting to be notified for pt bed status. Pt daughter phone number verified.

## 2021-11-18 NOTE — ED Provider Notes (Addendum)
[facility] EMERGENCY DEPARTMENT Provider Note   CSN: [id number] Arrival date & time: 11/18/21  1307     History  Chief Complaint  Patient presents with   Abdominal Pain   Fever    Kristin Gomez is a 76 y.o. female.  Patient here with nausea, vomiting, diarrhea for the last 3 days.  She is got abdominal pain throughout.  Has history of dementia.  Family states symptoms the last 3 days.  No recent antibiotics.  Has been throwing up frequently.  Did not think she had a fever.  She had a fever with EMS.  Has not eaten much since Monday.  No obvious sick contacts or suspicious food intake.  History of pacemaker, fibromyalgia, dementia, diabetes, stroke.  She denies any chest pain or shortness of breath.  Denies any pain with urination.  The history is provided by the patient.       Home Medications Prior to Admission medications   Medication Sig Start Date End Date Taking? Authorizing Provider  albuterol (PROVENTIL HFA;VENTOLIN HFA) 108 (90 Base) MCG/ACT inhaler Inhale 2 puffs into the lungs every 6 (six) hours as needed for wheezing or shortness of breath.  11/20/12  Yes [provider]  apixaban (ELIQUIS) 5 MG TABS tablet Take 1 tablet by mouth twice daily 06/03/21  Yes Deboraha Sprang, MD  carvedilol (COREG) 12.5 MG tablet Take 1 tablet (12.5 mg total) by mouth 2 (two) times daily with a meal. 07/26/21  Yes Deboraha Sprang, MD  Cholecalciferol (VITAMIN D3) 50 MCG (2000 UT) capsule Take 2,000 Units by mouth daily.    Yes [provider]  clonazePAM (KLONOPIN) 0.5 MG tablet Take 0.5 mg by mouth 2 (two) times daily as needed. 10/06/21  Yes [provider]  diphenhydramine-acetaminophen (TYLENOL PM) 25-500 MG TABS tablet Take 1-2 tablets by mouth at bedtime as needed (pain).   Yes [provider]  furosemide (LASIX) 20 MG tablet Take 1 tablet (20 mg total) by mouth daily. May take additional tablet as needed for weight gain of 2 pounds  overnight or 5 pounds in one week. 07/15/21  Yes Loel Dubonnet, NP  glipiZIDE (GLUCOTROL XL) 5 MG 24 hr tablet Take 5 mg by mouth at bedtime.   Yes [provider]  Infant Care Products [facility]) OINT Apply 1 Application topically daily as needed (inflammation).   Yes [provider]  ipratropium-albuterol (DUONEB) 0.5-2.5 (3) MG/3ML SOLN Take 3 mLs by nebulization every 6 (six) hours as needed (for shortness of breath or wheezing).  07/04/17  Yes [provider]  levothyroxine (SYNTHROID) 25 MCG tablet Take 25 mcg by mouth daily before breakfast.   Yes [provider]  memantine (NAMENDA) 10 MG tablet Take 1 tablet by mouth twice daily 09/14/21  Yes Suzzanne Cloud, NP  methylPREDNISolone (MEDROL) 4 MG tablet Take 1 mg by mouth as needed (Fibermyalgia). 08/16/18  Yes [provider]  omeprazole (PRILOSEC) 20 MG capsule Take 20 mg by mouth daily as needed (acid reflux).    Yes [provider]  potassium chloride SA (K-DUR,KLOR-CON) 20 MEQ tablet Take 20 mEq by mouth daily.   Yes [provider]  rosuvastatin (CRESTOR) 20 MG tablet Take 1 tablet (20 mg total) by mouth daily. 07/15/21  Yes Loel Dubonnet, NP      Allergies    Aspirin, Azithromycin, Infliximab, Methotrexate, Mushroom extract complex, Nsaids, Avelox [moxifloxacin hcl in nacl], Diovan [valsartan], Donepezil, Donepezil hcl, Moxifloxacin, Other, Oxycodone-acetaminophen,  Amoxicillin, Atorvastatin, Contrast media [iodinated contrast media], Iodine, Latex, Metformin, Metformin and related, Penicillins, Percocet [oxycodone-acetaminophen], Prednisone, Propofol, Sulfa antibiotics, and Vicodin [hydrocodone-acetaminophen]    Review of Systems   Review of Systems  Physical Exam Updated Vital Signs BP (!) 88/56 (BP Location: Right Arm)   Pulse 83   Temp 99.2 F (37.3 C) (Oral)   Resp 18   Ht '5\' 4"'$  (1.626 m)   Wt 68 kg   SpO2 94%   BMI 25.75 kg/m  Physical Exam Vitals and  nursing note reviewed.  Constitutional:      General: She is not in acute distress.    Appearance: She is well-developed. She is ill-appearing.  HENT:     Head: Normocephalic and atraumatic.     Comments: Dry mouth  Eyes:     Extraocular Movements: Extraocular movements intact.     Conjunctiva/sclera: Conjunctivae normal.     Pupils: Pupils are equal, round, and reactive to light.  Cardiovascular:     Rate and Rhythm: Normal rate and regular rhythm.     Heart sounds: Normal heart sounds. No murmur heard. Pulmonary:     Effort: Pulmonary effort is normal. No respiratory distress.     Breath sounds: Normal breath sounds.  Abdominal:     Palpations: Abdomen is soft.     Tenderness: There is generalized abdominal tenderness.  Musculoskeletal:        General: No swelling.     Cervical back: Neck supple.  Skin:    General: Skin is warm and dry.     Capillary Refill: Capillary refill takes less than 2 seconds.  Neurological:     Mental Status: She is alert.  Psychiatric:        Mood and Affect: Mood normal.     ED Results / Procedures / Treatments   Labs (all labs ordered are listed, but only abnormal results are displayed) Labs Reviewed  SARS CORONAVIRUS 2 BY RT PCR - Abnormal; Notable for the following components:      Result Value   SARS Coronavirus 2 by RT PCR POSITIVE (*)    All other components within normal limits  COMPREHENSIVE METABOLIC PANEL - Abnormal; Notable for the following components:   CO2 21 (*)    BUN 25 (*)    Creatinine, Ser 1.76 (*)    Calcium 7.9 (*)    Total Protein 5.8 (*)    Albumin 3.1 (*)    GFR, Estimated 30 (*)    All other components within normal limits  CBC WITH DIFFERENTIAL/PLATELET - Abnormal; Notable for the following components:   Platelets 122 (*)    All other components within normal limits  PROTIME-INR - Abnormal; Notable for the following components:   Prothrombin Time 17.4 (*)    INR 1.4 (*)    All other components within  normal limits  CULTURE, BLOOD (ROUTINE X 2)  CULTURE, BLOOD (ROUTINE X 2)  URINE CULTURE  GASTROINTESTINAL PANEL BY PCR, STOOL (REPLACES STOOL CULTURE)  LACTIC ACID, PLASMA  LIPASE, BLOOD  LACTIC ACID, PLASMA  URINALYSIS, ROUTINE W REFLEX MICROSCOPIC    EKG EKG Interpretation  Date/Time:  Thursday November 18 2021 14:00:42 EDT Ventricular Rate:  80 PR Interval:  159 QRS Duration: 149 QT Interval:  432 QTC Calculation: 499 R Axis:   248 Text Interpretation: Sinus rhythm Nonspecific IVCD with LAD Anterolateral infarct, old Confirmed by Lennice Sites [id number]) on 11/18/2021 2:32:49 PM  Radiology CT ABDOMEN PELVIS WO CONTRAST  Result Date: 11/18/2021 CLINICAL DATA:  Nausea, vomiting, abdominal pain EXAM: CT ABDOMEN AND PELVIS WITHOUT CONTRAST TECHNIQUE: Multidetector CT imaging of the abdomen and pelvis was performed following the standard protocol without IV contrast. Unenhanced CT was performed per clinician order. Lack of IV contrast limits sensitivity and specificity, especially for evaluation of abdominal/pelvic solid viscera. RADIATION DOSE REDUCTION: This exam was performed according to the departmental dose-optimization program which includes automated exposure control, adjustment of the mA and/or kV according to patient size and/or use of iterative reconstruction technique. COMPARISON:  09/26/2017 FINDINGS: Lower chest: Dependent hypoventilatory changes within the lower lobes. Multi lead cardiac pacer. Coronary artery atherosclerosis. Hepatobiliary: Indeterminate subcentimeter hypodensities within the right lobe liver, largest measuring 5 mm image 20/3. Otherwise unremarkable unenhanced appearance of the liver. No biliary duct dilation. Previous cholecystectomy. Pancreas: Unremarkable unenhanced appearance. Spleen: Unremarkable unenhanced appearance. Adrenals/Urinary Tract: No urinary tract calculi or obstructive uropathy within either kidney. The adrenals and bladder are unremarkable.  Stomach/Bowel: No bowel obstruction or ileus. No bowel wall thickening or inflammatory change. Vascular/Lymphatic: Aortic atherosclerosis. No enlarged abdominal or pelvic lymph nodes. Reproductive: Status post hysterectomy. No adnexal masses. Other: No free fluid or free intraperitoneal gas. No abdominal wall hernia. Musculoskeletal: No acute or destructive bony lesions. Chronic bilateral rib fractures. Chronic appearing compression deformities at T6, T7, and L1, with prior vertebral augmentation at the L1 level again noted. IMPRESSION: 1. No acute intra-abdominal or intrapelvic process. 2.  Aortic Atherosclerosis (ICD10-I70.0). Electronically Signed   By: Randa Ngo M.D.   On: 11/18/2021 15:06   DG Chest Portable 1 View  Result Date: 11/18/2021 CLINICAL DATA:  Fever and vomiting for 3 days in a 76 year old female. EXAM: PORTABLE CHEST 1 VIEW COMPARISON:  August 20, 2021 FINDINGS: EKG leads project over the chest. Multi lead pacer defibrillator remains in place, power pack over the LEFT chest. Cardiomediastinal contours and hilar structures are stable with signs of cardiac enlargement not well assessed due to low lung volumes. No signs of lobar consolidation. No pneumothorax. Fissural fluid in the RIGHT chest and mildly obscured RIGHT hemidiaphragm. No gross pulmonary edema. Limited assessment of skeletal structures with potential midthoracic compression fracture that is new compared to recent imaging from July. Rib fractures of indeterminate age along the LEFT chest not clearly visible on previous imaging posterolateral seventh rib. IMPRESSION: 1. Rib fractures of indeterminate age along the LEFT chest not clearly visible on previous imaging from July. Correlate with point tenderness in this area. 2. Low lung volumes with fissural fluid in the RIGHT chest and mildly obscured RIGHT hemidiaphragm. Findings raise the question of small pleural effusion with associated airspace disease. In the appropriate clinical  setting this could represent RIGHT lower lobe pneumonia but is not well evaluated. 3. Potential midthoracic compression fracture that is new compared to recent imaging from July. Electronically Signed   By: Zetta Bills M.D.   On: 11/18/2021 14:43    Procedures .Critical Care  Performed by: Lennice Sites, DO Authorized by: Lennice Sites, DO   Critical care provider statement:    Critical care time (minutes):  35   Critical care was necessary to treat or prevent imminent or life-threatening deterioration of the following conditions:  Sepsis   Critical care was time spent personally by me on the following activities:  Blood draw for specimens, development of treatment plan with patient or surrogate, discussions with consultants, discussions with primary provider, evaluation of patient's response to treatment, obtaining history from patient or surrogate, ordering and performing treatments and interventions, ordering and review of  laboratory studies, ordering and review of radiographic studies, pulse oximetry, re-evaluation of patient's condition and review of old charts   Care discussed with: admitting provider       Medications Ordered in ED Medications  cefTRIAXone (ROCEPHIN) 2 g in sodium chloride 0.9 % 100 mL IVPB (2 g Intravenous New Bag/Given 11/18/21 1434)  metroNIDAZOLE (FLAGYL) IVPB 500 mg (has no administration in time range)  0.9 %  sodium chloride infusion (has no administration in time range)  ondansetron (ZOFRAN) injection 4 mg (has no administration in time range)  sodium chloride 0.9 % bolus 1,000 mL (1,000 mLs Intravenous New Bag/Given 11/18/21 1410)  ondansetron (ZOFRAN) injection 4 mg (4 mg Intravenous Given 11/18/21 1411)    ED Course/ Medical Decision Making/ A&P                           Medical Decision Making Amount and/or Complexity of Data Reviewed Labs: ordered. Radiology: ordered. ECG/medicine tests: ordered.  Risk Prescription drug management. Decision  regarding hospitalization.   Kristin Gomez is here with abdominal pain, nausea, vomiting, diarrhea.  Patient hypotensive upon arrival.  Temperature with EMS was 101.  Temperature here 99.2 but will check rectal temperature.  Sepsis work-up initiated with CBC, blood cultures, CMP, urinalysis.  We will give a dose IV Zosyn, IV fluids, IV Zofran.  Differential diagnosis likely sepsis from a colitis versus foodborne illness versus viral process.  No recent antibiotics and seems less likely to be C. difficile.  We will get a CT scan abdomen pelvis.  We will get C. difficile, GI stool pathogen panel.  We will get chest x-ray.  Per my review and interpretation of labs is no significant leukocytosis or anemia.  Creatinine is elevated to 1.76.  Otherwise electrolytes are unremarkable.  EKG shows sinus rhythm per my review and interpretation.  Blood pressure has improved with IV fluids.  Patient awaiting CT scan abdomen and pelvis to rule out surgical process.    CT scan per radiology report with no acute findings.  The family member has arrived and they state may be urine infection but she had a lot of diarrhea which is atypical for UTIs for her.  Urinalysis still pending.  Overall patient still very nauseous, dehydrated.  Suspect either a viral gastroenteritis and possibly UTI as well.  Hemodynamics have improved.  Blood pressure in the low 100s.  Will admit to medicine for further care.  Awaiting admit, Covid test now positive and likely source of symptoms.  This chart was dictated using voice recognition software.  Despite best efforts to proofread,  errors can occur which can change the documentation meaning.     Final Clinical Impression(s) / ED Diagnoses Final diagnoses:  AKI (acute kidney injury) ([place])  Nausea and vomiting, unspecified vomiting type  Sepsis, due to unspecified organism, unspecified whether acute organ dysfunction present [facility])  Diarrhea, unspecified type  COVID-19    Rx / DC  Orders ED Discharge Orders     None         Lennice Sites, DO 11/18/21 [place], [place], DO 11/18/21 1438    [place], [place], DO 11/18/21 1439    Ronnald Nian, [place], DO 11/18/21 1519    [place], [place], DO 11/18/21 1535

## 2021-11-18 NOTE — ED Triage Notes (Signed)
Vomiting x 3 days with fever

## 2021-11-18 NOTE — ED Provider Triage Note (Signed)
Emergency Medicine Provider Triage Evaluation Note  Kristin Gomez , a 76 y.o. female  was evaluated in triage.  Pt complains of nausea vomiting abdominal pain.  Patient reports he went to bed last night feeling fine, woke up this morning with abdominal pain rated 9 out of 10.  Patient appears very uncomfortable in triage, cannot specify where her abdominal pain is.  Patient does have left lower quadrant, right upper quadrant abdominal pain on examination.  Patient states she lives with her daughter in [place].  The patient is alert and oriented x4.  Patient hasn't eaten since Monday night per daughter. Patient daughter states that patient was complaining of abdominal pain last night around 10PM. Last BM per patient daughter was last night.   Review of Systems  Positive:  Negative:   Physical Exam  BP (!) 88/56 (BP Location: Right Arm)   Pulse 83   Temp 99.2 F (37.3 C) (Oral)   Resp 18   Ht '5\' 4"'$  (1.626 m)   Wt 68 kg   SpO2 94%   BMI 25.75 kg/m  Gen:   Awake, no distress   Resp:  Normal effort  MSK:   Moves extremities without difficulty  Other:    Medical Decision Making  Medically screening exam initiated at 1:26 PM.  Appropriate orders placed.  Kristin Gomez was informed that the remainder of the evaluation will be completed by another provider, this initial triage assessment does not replace that evaluation, and the importance of remaining in the ED until their evaluation is complete.     Kristin Cecil, PA-C 11/18/21 1330

## 2021-11-18 NOTE — Progress Notes (Signed)
Elink following sepsis bundle. °

## 2021-11-19 ENCOUNTER — Other Ambulatory Visit (HOSPITAL_COMMUNITY): Payer: Self-pay

## 2021-11-19 DIAGNOSIS — U071 COVID-19: Secondary | ICD-10-CM | POA: Diagnosis not present

## 2021-11-19 DIAGNOSIS — E119 Type 2 diabetes mellitus without complications: Secondary | ICD-10-CM | POA: Diagnosis not present

## 2021-11-19 DIAGNOSIS — N179 Acute kidney failure, unspecified: Secondary | ICD-10-CM | POA: Diagnosis not present

## 2021-11-19 DIAGNOSIS — I509 Heart failure, unspecified: Secondary | ICD-10-CM | POA: Diagnosis not present

## 2021-11-19 LAB — COMPREHENSIVE METABOLIC PANEL
ALT: 25 U/L (ref 0–44)
AST: 38 U/L (ref 15–41)
Albumin: 2.5 g/dL — ABNORMAL LOW (ref 3.5–5.0)
Alkaline Phosphatase: 31 U/L — ABNORMAL LOW (ref 38–126)
Anion gap: 9 (ref 5–15)
BUN: 21 mg/dL (ref 8–23)
CO2: 19 mmol/L — ABNORMAL LOW (ref 22–32)
Calcium: 7.1 mg/dL — ABNORMAL LOW (ref 8.9–10.3)
Chloride: 109 mmol/L (ref 98–111)
Creatinine, Ser: 1.38 mg/dL — ABNORMAL HIGH (ref 0.44–1.00)
GFR, Estimated: 40 mL/min — ABNORMAL LOW (ref >60)
Glucose, Bld: 87 mg/dL (ref 70–99)
Potassium: 3.5 mmol/L (ref 3.5–5.1)
Sodium: 137 mmol/L (ref 135–145)
Total Bilirubin: 0.7 mg/dL (ref 0.3–1.2)
Total Protein: 4.6 g/dL — ABNORMAL LOW (ref 6.5–8.1)

## 2021-11-19 LAB — CBC WITH DIFFERENTIAL/PLATELET
Abs Immature Granulocytes: 0.01 10*3/uL (ref 0.00–0.07)
Basophils Absolute: 0 10*3/uL (ref 0.0–0.1)
Basophils Relative: 0 %
Eosinophils Absolute: 0 10*3/uL (ref 0.0–0.5)
Eosinophils Relative: 0 %
HCT: 35.3 % — ABNORMAL LOW (ref 36.0–46.0)
Hemoglobin: 12 g/dL (ref 12.0–15.0)
Immature Granulocytes: 0 %
Lymphocytes Relative: 46 %
Lymphs Abs: 1.6 10*3/uL (ref 0.7–4.0)
MCH: 32.6 pg (ref 26.0–34.0)
MCHC: 34 g/dL (ref 30.0–36.0)
MCV: 95.9 fL (ref 80.0–100.0)
Monocytes Absolute: 0.5 10*3/uL (ref 0.1–1.0)
Monocytes Relative: 14 %
Neutro Abs: 1.4 10*3/uL — ABNORMAL LOW (ref 1.7–7.7)
Neutrophils Relative %: 40 %
Platelets: ADEQUATE 10*3/uL (ref 150–400)
RBC: 3.68 MIL/uL — ABNORMAL LOW (ref 3.87–5.11)
RDW: 13.7 % (ref 11.5–15.5)
WBC: 3.6 10*3/uL — ABNORMAL LOW (ref 4.0–10.5)
nRBC: 0 % (ref 0.0–0.2)

## 2021-11-19 LAB — LACTIC ACID, PLASMA: Lactic Acid, Venous: 0.9 mmol/L (ref 0.5–1.9)

## 2021-11-19 LAB — HEMOGLOBIN A1C
Hgb A1c MFr Bld: 5 % (ref 4.8–5.6)
Mean Plasma Glucose: 96.8 mg/dL

## 2021-11-19 LAB — CBG MONITORING, ED
Glucose-Capillary: 73 mg/dL (ref 70–99)
Glucose-Capillary: 82 mg/dL (ref 70–99)

## 2021-11-19 LAB — PHOSPHORUS: Phosphorus: 3.6 mg/dL (ref 2.5–4.6)

## 2021-11-19 LAB — MAGNESIUM: Magnesium: 1.9 mg/dL (ref 1.7–2.4)

## 2021-11-19 MED ORDER — CARVEDILOL 12.5 MG PO TABS
12.5000 mg | ORAL_TABLET | Freq: Two times a day (BID) | ORAL | Status: DC
  Administered 2021-11-19: 12.5 mg via ORAL
  Filled 2021-11-19: qty 1

## 2021-11-19 MED ORDER — MOLNUPIRAVIR EUA 200MG CAPSULE
4.0000 | ORAL_CAPSULE | Freq: Two times a day (BID) | ORAL | 0 refills | Status: AC
  Filled 2021-11-19: qty 32, 4d supply, fill #0

## 2021-11-19 NOTE — ED Notes (Signed)
Hospitalist Elgergawy MD called this RN to notify that patient will be discharged from the ED. Awaiting discharge orders at this time.

## 2021-11-19 NOTE — ED Notes (Signed)
Discharge instructions reviewed with patient. Patient verbalized understanding of instructions. Follow-up care and medications were reviewed. Patient ambulatory with steady gait. VSS upon discharge.  ?

## 2021-11-19 NOTE — ED Notes (Signed)
Dr Elgergawy at bedside °

## 2021-11-19 NOTE — ED Notes (Signed)
Pt assisted to bedside toilet and brief changed, as well as, bed linen and pt repositioned for comfort, lights dimmed, warm blanket provided and call bed within reach.

## 2021-11-19 NOTE — Evaluation (Signed)
Occupational Therapy Evaluation Patient Details Name: Kristin Gomez MRN: [id number] DOB: 1945-08-10 Today's Date: 11/19/2021   History of Present Illness 76 y.o. female, with history of chronic systolic heart failure status post biventricular pacemaker placement nonischemic cardiomyopathy, CVA, hyperlipidemia, dementia, hypothyroidism,   -Patient presents to ED secondary to complaints of abdominal pain, nausea, vomiting, diarrhea and fever.  Covid 19 +   Clinical Impression   Patient admitted for the diagnosis above.  PTA she lives with her daughter, walks without an AD, able to complete her own ADL, and states she still drives locally.  Daughter assists with home management and meals.  Generalized weakness and pain to her right upper arm are her only complaints.  Currently she is needing up to [place] for basic mobility and ADL completion.  OT can follow in the acute setting, but she appears as if she could transition directly home with her daughter when cleared medically.  No post acute OT is anticipated.        Recommendations for follow up therapy are one component of a multi-disciplinary discharge planning process, led by the attending physician.  Recommendations may be updated based on patient status, additional functional criteria and insurance authorization.   Follow Up Recommendations  No OT follow up    Assistance Recommended at Discharge Set up Supervision/Assistance  Patient can return home with the following Assist for transportation;Assistance with cooking/housework    Functional Status Assessment  Patient has had a recent decline in their functional status and demonstrates the ability to make significant improvements in function in a reasonable and predictable amount of time.  Equipment Recommendations  None recommended by OT    Recommendations for Other Services       Precautions / Restrictions Precautions Precautions: Fall Restrictions Weight Bearing Restrictions: No       Mobility Bed Mobility Overal bed mobility: Needs Assistance Bed Mobility: Supine to Sit, Sit to Supine     Supine to sit: Min assist Sit to supine: Min assist        Transfers Overall transfer level: Needs assistance   Transfers: Sit to/from Stand Sit to Stand: Min guard                  Balance Overall balance assessment: Needs assistance Sitting-balance support: Feet unsupported Sitting balance-Leahy Scale: Fair     Standing balance support: Single extremity supported Standing balance-Leahy Scale: Fair                             ADL either performed or assessed with clinical judgement   ADL       Grooming: Wash/dry hands;Wash/dry face;Set up;Sitting               Lower Body Dressing: Minimal assistance;Sit to/from stand   Toilet Transfer: Minimal assistance;BSC/3in1;Stand-pivot                   Vision Patient Visual Report: No change from baseline       Perception Perception Perception: Not tested   Praxis Praxis Praxis: Not tested    Pertinent Vitals/Pain Pain Assessment Pain Assessment: Faces Faces Pain Scale: Hurts little more Pain Location: R upper arm Pain Descriptors / Indicators: Tender Pain Intervention(s): Monitored during session     Hand Dominance Right   Extremity/Trunk Assessment Upper Extremity Assessment Upper Extremity Assessment: Generalized weakness   Lower Extremity Assessment Lower Extremity Assessment: Defer to PT evaluation   Cervical /  Trunk Assessment Cervical / Trunk Assessment: Kyphotic   Communication Communication Communication: No difficulties   Cognition Arousal/Alertness: Awake/alert Behavior During Therapy: WFL for tasks assessed/performed Overall Cognitive Status: History of cognitive impairments - at baseline                                 General Comments: following commands well, able to describe home setting.  STM deficts     General  Comments   VSS on RA    Exercises     Shoulder Instructions      Home Living Family/patient expects to be discharged to:: Private residence Living Arrangements: Children Available Help at Discharge: Family;Available 24 hours/day Type of Home: House Home Access: Ramped entrance     Home Layout: One level     Bathroom Shower/Tub: Teacher, early years/pre: Handicapped height Bathroom Accessibility: Yes How Accessible: Accessible via walker Home Equipment: Conservation officer, nature (2 wheels);BSC/3in1          Prior Functioning/Environment Prior Level of Function : Independent/Modified Independent                        OT Problem List: Decreased strength;Impaired balance (sitting and/or standing)      OT Treatment/Interventions: Self-care/ADL training;Therapeutic activities;Balance training    OT Goals(Current goals can be found in the care plan section) Acute Rehab OT Goals Patient Stated Goal: hoping to go home today OT Goal Formulation: With patient Time For Goal Achievement: 12/03/21 Potential to Achieve Goals: Good  OT Frequency: Min 2X/week    Co-evaluation              AM-PAC OT "6 Clicks" Daily Activity     Outcome Measure Help from another person eating meals?: None Help from another person taking care of personal grooming?: None Help from another person toileting, which includes using toliet, bedpan, or urinal?: A Little Help from another person bathing (including washing, rinsing, drying)?: A Little Help from another person to put on and taking off regular upper body clothing?: None Help from another person to put on and taking off regular lower body clothing?: A Little 6 Click Score: 21   End of Session Nurse Communication: Mobility status  Activity Tolerance: Patient tolerated treatment well Patient left: in bed;with call bell/phone within reach  OT Visit Diagnosis: Unsteadiness on feet (R26.81);Muscle weakness (generalized) (M62.81)                 Time: 1320-1340 OT Time Calculation (min): 20 min Charges:  OT General Charges $OT Visit: 1 Visit OT Evaluation $OT Eval Moderate Complexity: 1 Mod  11/19/2021  RP, OTR/L  Acute Rehabilitation Services  Office:  [phone]   Metta Clines 11/19/2021, 1:51 PM

## 2021-11-19 NOTE — Discharge Summary (Signed)
Physician Discharge Summary  Kristin Gomez:[id number] DOB: [birth year] DOA: 11/18/2021  PCP: Bonnita Nasuti, MD  Admit date: 11/18/2021 Discharge date: 11/19/2021  Admitted From: Home Disposition:  Home  Recommendations for Outpatient Follow-up:  Follow up with PCP in 1-2 weeks Please obtain BMP/CBC in one week   Discharge Condition: Stable CODE STATUS: DNR Diet recommendation: Heart healthy/carb modified  Brief/Interim Summary: Kristin Gomez  is a 76 y.o. female, with history of chronic systolic heart failure status post biventricular pacemaker placement nonischemic cardiomyopathy, CVA, hyperlipidemia, dementia, hypothyroidism,  -Patient presents to ED secondary to complaints of abdominal pain, nausea, vomiting, diarrhea and fever for last 3 days, patient complains of generalized abdominal pain, nausea, vomiting, diarrhea, soft stool, not watery, but multiple episodes, as well poor oral intake, patient with dementia, daughter at bedside assist with history, no recent antibiotics, no hematemesis, no bright red blood per rectum, no obvious sick contacts or suspicious food intake, she denies dysuria, cough, dyspnea. -In ED CT abdomen pelvis with no acute findings, here UA significant for mild pyuria, labs significant for elevated creatinine at 1.7, her work-up significant for positive COVID-19 infection, AKI, [facility] hospitalist consulted to admit.  Patient was admitted for observation overnight, symptoms much improved, she feels much better this morning and requesting to go home, appetite has improved, and no further nausea, vomiting or diarrhea  AKI -Due to volume depletion and dehydration in the setting of nausea, vomiting and poor oral intake as well as soft blood pressure -She was admitted overnight, kept on IV fluids, creatinine has improved from 1.7-1.3, not back at baseline yet, but appetite has improved she is able to tolerate oral intake, with no nausea or vomiting, so she was  encouraged to follow-up with her PCP regarding repeat BMP next week.   COVID-19 infection -No hypoxia, dyspnea, but she is high risk for decompensation given her age, reality and multiple comorbidities, so she was started on molnupiravir, and to continue 5 days course upon discharge. -Given no hypoxia will hold on steroids -Continue with incentive spirometry and flutter valve   Hypertension>> hypotension -With known history of hypertension, he was hypotensive initially upon presentation, today blood pressure is back to normal, so she was resumed on her home meds before discharge .   Acute on chronic systolic/diastolic CHF -Patient with known history of CHF, most recent echo in 2021 with EF 40 to 45% -lasix has been held during hospital stay, and was discontinued on discharge . -He appears to be a euvolemic at time of discharge.   Paroxysmal A-fib -She is currently with paced rhythm, continue with Eliquis, metoprolol and Cardizem  History of CVA -Continue with Eliquis   Diabetes mellitus type 2  -Insulin sliding scale during hospital stay, continue with glipizide on discharge   Hypothyroidism -Continue with Synthroid        Discharge Diagnoses:  Principal Problem:   AKI (acute kidney injury) ([place]) Active Problems:   Hypertension   Diabetes mellitus type 2 in nonobese (HCC)   Chronic CHF (congestive heart failure) (HCC)   PAF (paroxysmal atrial fibrillation) (HCC)   Biventricular cardiac pacemaker in situ    Discharge Instructions  Discharge Instructions     Diet - low sodium heart healthy   Complete by: As directed    Discharge instructions   Complete by: As directed    Increase activity slowly   Complete by: As directed       Allergies as of 11/19/2021       Reactions  Aspirin Shortness Of Breath, Other (See Comments)   Patient states that this caused an asthma attack Patient states that is causes an asthma attack.   Azithromycin Anaphylaxis   Infliximab  Hives   remicade   Methotrexate Hives   Mushroom Extract Complex Anaphylaxis, Shortness Of Breath, Swelling   Nsaids Shortness Of Breath, Other (See Comments)   Asthma attack Asthma attack   Avelox [moxifloxacin Hcl In Nacl] Swelling   Site of swelling not recalled   Diovan [valsartan] Swelling   Site of swelling not recalled   Donepezil Diarrhea   Donepezil Hcl Other (See Comments)   Diarrhea Diarrhea   Moxifloxacin Swelling   Other Swelling   Site of swelling not recalled   Oxycodone-acetaminophen Nausea Only   Amoxicillin Rash   Atorvastatin Swelling, Other (See Comments)   Exacerbated Psoriatic arthritis    Contrast Media [iodinated Contrast Media] Rash   Iodine Rash   Latex Rash   Metformin Rash   ??   Metformin And Related Rash   ??   Penicillins Rash   Has patient had a PCN reaction causing immediate rash, facial/tongue/throat swelling, SOB or lightheadedness with hypotension: Yes Has patient had a PCN reaction causing severe rash involving mucus membranes or skin necrosis: No Has patient had a PCN reaction that required hospitalization: No Has patient had a PCN reaction occurring within the last 10 years: No If all of the above answers are "NO", then may proceed with Cephalosporin use.   Percocet [oxycodone-acetaminophen] Nausea Only   Prednisone Rash, Other (See Comments)   Can only be tolerated with Benadryl HAS TO TAKE BENADRYL WITH PREDNISONE   Propofol Rash   Sulfa Antibiotics Nausea Only, Rash   Vicodin [hydrocodone-acetaminophen] Nausea Only        Medication List     STOP taking these medications    furosemide 20 MG tablet Commonly known as: LASIX       TAKE these medications    albuterol 108 (90 Base) MCG/ACT inhaler Commonly known as: VENTOLIN HFA Inhale 2 puffs into the lungs every 6 (six) hours as needed for wheezing or shortness of breath.   carvedilol 12.5 MG tablet Commonly known as: COREG Take 1 tablet (12.5 mg total) by mouth 2  (two) times daily with a meal.   clonazePAM 0.5 MG tablet Commonly known as: KLONOPIN Take 0.5 mg by mouth 2 (two) times daily as needed.   Dermacloud Oint Apply 1 Application topically daily as needed (inflammation).   diphenhydramine-acetaminophen 25-500 MG Tabs tablet Commonly known as: TYLENOL PM Take 1-2 tablets by mouth at bedtime as needed (pain).   Eliquis 5 MG Tabs tablet Generic drug: apixaban Take 1 tablet by mouth twice daily   glipiZIDE 5 MG 24 hr tablet Commonly known as: GLUCOTROL XL Take 5 mg by mouth at bedtime.   ipratropium-albuterol 0.5-2.5 (3) MG/3ML Soln Commonly known as: DUONEB Take 3 mLs by nebulization every 6 (six) hours as needed (for shortness of breath or wheezing).   levothyroxine 25 MCG tablet Commonly known as: SYNTHROID Take 25 mcg by mouth daily before breakfast.   memantine 10 MG tablet Commonly known as: NAMENDA Take 1 tablet by mouth twice daily   methylPREDNISolone 4 MG tablet Commonly known as: MEDROL Take 1 mg by mouth as needed (Fibermyalgia).   molnupiravir EUA 200 mg Caps capsule Commonly known as: LAGEVRIO Take 4 capsules (800 mg total) by mouth 2 (two) times daily for 4 days. Packaged medicine is already at bedside, patient to  take rest of the package to continue total of 5 days treatment   omeprazole 20 MG capsule Commonly known as: PRILOSEC Take 20 mg by mouth daily as needed (acid reflux).   potassium chloride SA 20 MEQ tablet Commonly known as: KLOR-CON M Take 20 mEq by mouth daily.   rosuvastatin 20 MG tablet Commonly known as: CRESTOR Take 1 tablet (20 mg total) by mouth daily.   Vitamin D3 50 MCG (2000 UT) capsule Take 2,000 Units by mouth daily.        Allergies  Allergen Reactions   Aspirin Shortness Of Breath and Other (See Comments)    Patient states that this caused an asthma attack Patient states that is causes an asthma attack.   Azithromycin Anaphylaxis   Infliximab Hives    remicade    Methotrexate Hives   Mushroom Extract Complex Anaphylaxis, Shortness Of Breath and Swelling   Nsaids Shortness Of Breath and Other (See Comments)    Asthma attack Asthma attack   Avelox [Moxifloxacin Hcl In Nacl] Swelling    Site of swelling not recalled   Diovan [Valsartan] Swelling    Site of swelling not recalled   Donepezil Diarrhea   Donepezil Hcl Other (See Comments)    Diarrhea Diarrhea   Moxifloxacin Swelling   Other Swelling    Site of swelling not recalled   Oxycodone-Acetaminophen Nausea Only   Amoxicillin Rash   Atorvastatin Swelling and Other (See Comments)    Exacerbated Psoriatic arthritis    Contrast Media [Iodinated Contrast Media] Rash   Iodine Rash   Latex Rash   Metformin Rash    ??   Metformin And Related Rash    ??   Penicillins Rash    Has patient had a PCN reaction causing immediate rash, facial/tongue/throat swelling, SOB or lightheadedness with hypotension: Yes Has patient had a PCN reaction causing severe rash involving mucus membranes or skin necrosis: No Has patient had a PCN reaction that required hospitalization: No Has patient had a PCN reaction occurring within the last 10 years: No If all of the above answers are "NO", then may proceed with Cephalosporin use.    Percocet [Oxycodone-Acetaminophen] Nausea Only   Prednisone Rash and Other (See Comments)    Can only be tolerated with Benadryl HAS TO TAKE BENADRYL WITH PREDNISONE   Propofol Rash   Sulfa Antibiotics Nausea Only and Rash   Vicodin [Hydrocodone-Acetaminophen] Nausea Only    Consultations: None   Procedures/Studies: CT ABDOMEN PELVIS WO CONTRAST  Result Date: 11/18/2021 CLINICAL DATA:  Nausea, vomiting, abdominal pain EXAM: CT ABDOMEN AND PELVIS WITHOUT CONTRAST TECHNIQUE: Multidetector CT imaging of the abdomen and pelvis was performed following the standard protocol without IV contrast. Unenhanced CT was performed per clinician order. Lack of IV contrast limits sensitivity  and specificity, especially for evaluation of abdominal/pelvic solid viscera. RADIATION DOSE REDUCTION: This exam was performed according to the departmental dose-optimization program which includes automated exposure control, adjustment of the mA and/or kV according to patient size and/or use of iterative reconstruction technique. COMPARISON:  09/26/2017 FINDINGS: Lower chest: Dependent hypoventilatory changes within the lower lobes. Multi lead cardiac pacer. Coronary artery atherosclerosis. Hepatobiliary: Indeterminate subcentimeter hypodensities within the right lobe liver, largest measuring 5 mm image 20/3. Otherwise unremarkable unenhanced appearance of the liver. No biliary duct dilation. Previous cholecystectomy. Pancreas: Unremarkable unenhanced appearance. Spleen: Unremarkable unenhanced appearance. Adrenals/Urinary Tract: No urinary tract calculi or obstructive uropathy within either kidney. The adrenals and bladder are unremarkable. Stomach/Bowel: No bowel obstruction or ileus.  No bowel wall thickening or inflammatory change. Vascular/Lymphatic: Aortic atherosclerosis. No enlarged abdominal or pelvic lymph nodes. Reproductive: Status post hysterectomy. No adnexal masses. Other: No free fluid or free intraperitoneal gas. No abdominal wall hernia. Musculoskeletal: No acute or destructive bony lesions. Chronic bilateral rib fractures. Chronic appearing compression deformities at T6, T7, and L1, with prior vertebral augmentation at the L1 level again noted. IMPRESSION: 1. No acute intra-abdominal or intrapelvic process. 2.  Aortic Atherosclerosis (ICD10-I70.0). Electronically Signed   By: Randa Ngo M.D.   On: 11/18/2021 15:06   DG Chest Portable 1 View  Result Date: 11/18/2021 CLINICAL DATA:  Fever and vomiting for 3 days in a 76 year old female. EXAM: PORTABLE CHEST 1 VIEW COMPARISON:  August 20, 2021 FINDINGS: EKG leads project over the chest. Multi lead pacer defibrillator remains in place, power  pack over the LEFT chest. Cardiomediastinal contours and hilar structures are stable with signs of cardiac enlargement not well assessed due to low lung volumes. No signs of lobar consolidation. No pneumothorax. Fissural fluid in the RIGHT chest and mildly obscured RIGHT hemidiaphragm. No gross pulmonary edema. Limited assessment of skeletal structures with potential midthoracic compression fracture that is new compared to recent imaging from July. Rib fractures of indeterminate age along the LEFT chest not clearly visible on previous imaging posterolateral seventh rib. IMPRESSION: 1. Rib fractures of indeterminate age along the LEFT chest not clearly visible on previous imaging from July. Correlate with point tenderness in this area. 2. Low lung volumes with fissural fluid in the RIGHT chest and mildly obscured RIGHT hemidiaphragm. Findings raise the question of small pleural effusion with associated airspace disease. In the appropriate clinical setting this could represent RIGHT lower lobe pneumonia but is not well evaluated. 3. Potential midthoracic compression fracture that is new compared to recent imaging from July. Electronically Signed   By: Zetta Bills M.D.   On: 11/18/2021 14:43      Subjective:  Reports she is feeling better today, asking to go home, she had a good lunch, following all her tray.  No further nausea, vomiting or diarrhea.  Discharge Exam: Vitals:   11/19/21 1315 11/19/21 1352  BP: 122/69   Pulse: 80   Resp: 13   Temp:  99.5 F (37.5 C)  SpO2: 93%    Vitals:   11/19/21 0925 11/19/21 1100 11/19/21 1315 11/19/21 1352  BP:  120/63 122/69   Pulse:  77 80   Resp:  (!) 26 13   Temp: (!) 97.5 F (36.4 C)   99.5 F (37.5 C)  TempSrc:    Oral  SpO2:  93% 93%   Weight:      Height:        General: Kristin Gomez is alert, awake, not in acute distress. Cardiovascular: RRR, S1/S2 +, no rubs, no gallops Respiratory: CTA bilaterally, no wheezing, no rhonchi Abdominal: Soft, NT,  ND, bowel sounds + Extremities: no edema, no cyanosis    The results of significant diagnostics from this hospitalization (including imaging, microbiology, ancillary and laboratory) are listed below for reference.     Microbiology: Recent Results (from the past 240 hour(s))  SARS Coronavirus 2 by RT PCR (hospital order, performed in [facility] hospital lab) *cepheid single result test* Anterior Nasal Swab     Status: Abnormal   Collection Time: 11/18/21  1:18 PM   Specimen: Anterior Nasal Swab  Result Value Ref Range Status   SARS Coronavirus 2 by RT PCR POSITIVE (A) NEGATIVE Final    Comment: (NOTE)  SARS-CoV-2 target nucleic acids are DETECTED  SARS-CoV-2 RNA is generally detectable in upper respiratory specimens  during the acute phase of infection.  Positive results are indicative  of the presence of the identified virus, but do not rule out bacterial infection or co-infection with other pathogens not detected by the test.  Clinical correlation with patient history and  other diagnostic information is necessary to determine patient infection status.  The expected result is negative.  Fact Sheet for Patients:   https://www.patel.info/   Fact Sheet for Healthcare Providers:   https://hall.com/    This test is not yet approved or cleared by the [place] FDA and  has been authorized for detection and/or diagnosis of SARS-CoV-2 by FDA under an Emergency Use Authorization (EUA).  This EUA will remain in effect (meaning this test can be used) for the duration of  the COVID-19 declaration under Section 564(b)(1)  of the Act, 21 U.S.C. section 360-bbb-3(b)(1), unless the authorization is terminated or revoked sooner.   Performed at [facility], [place] [address], [place], [place] [address]   Culture, blood (Routine x 2)     Status: None (Preliminary result)   Collection Time: 11/18/21  1:33 PM   Specimen: BLOOD  Result Value  Ref Range Status   Specimen Description BLOOD SITE NOT SPECIFIED  Final   Special Requests   Final    BOTTLES DRAWN AEROBIC AND ANAEROBIC Blood Culture adequate volume   Culture   Final    NO GROWTH < 24 HOURS Performed at [facility], [place] [address] [place], [place] [address]    Report Status PENDING  Incomplete  Culture, blood (Routine x 2)     Status: None (Preliminary result)   Collection Time: 11/18/21  1:55 PM   Specimen: BLOOD  Result Value Ref Range Status   Specimen Description BLOOD SITE NOT SPECIFIED  Final   Special Requests   Final    BOTTLES DRAWN AEROBIC AND ANAEROBIC Blood Culture results may not be optimal due to an inadequate volume of blood received in culture bottles   Culture   Final    NO GROWTH < 24 HOURS Performed at [facility], [place] [address] [place], [place] [address]    Report Status PENDING  Incomplete     Labs: BNP (last 3 results) No results for input(s): "BNP" in the last 8760 hours. Basic Metabolic Panel: Recent Labs  Lab 11/18/21 1333 11/19/21 0156  NA 136 137  K 3.5 3.5  CL 105 109  CO2 21* 19*  GLUCOSE 77 87  BUN 25* 21  CREATININE 1.76* 1.38*  CALCIUM 7.9* 7.1*  MG  --  1.9  PHOS  --  3.6   Liver Function Tests: Recent Labs  Lab 11/18/21 1333 11/19/21 0156  AST 41 38  ALT 32 25  ALKPHOS 42 31*  BILITOT 0.7 0.7  PROT 5.8* 4.6*  ALBUMIN 3.1* 2.5*   Recent Labs  Lab 11/18/21 1333  LIPASE 43   No results for input(s): "AMMONIA" in the last 168 hours. CBC: Recent Labs  Lab 11/18/21 1333 11/19/21 0156  WBC 5.3 3.6*  NEUTROABS 3.2 1.4*  HGB 14.2 12.0  HCT 42.7 35.3*  MCV 96.2 95.9  PLT 122* PLATELET CLUMPS NOTED ON SMEAR, COUNT APPEARS ADEQUATE   Cardiac Enzymes: No results for input(s): "CKTOTAL", "CKMB", "CKMBINDEX", "TROPONINI" in the last 168 hours. BNP: Invalid input(s): "POCBNP" CBG: Recent Labs  Lab 11/18/21 1827 11/18/21 2044 11/18/21 2205 11/19/21  0803 11/19/21 1237  GLUCAP 73 84  76 73 82   D-Dimer No results for input(s): "DDIMER" in the last 72 hours. Hgb A1c Recent Labs    11/19/21 0156  HGBA1C 5.0   Lipid Profile No results for input(s): "CHOL", "HDL", "LDLCALC", "TRIG", "CHOLHDL", "LDLDIRECT" in the last 72 hours. Thyroid function studies No results for input(s): "TSH", "T4TOTAL", "T3FREE", "THYROIDAB" in the last 72 hours.  Invalid input(s): "FREET3" Anemia work up No results for input(s): "VITAMINB12", "FOLATE", "FERRITIN", "TIBC", "IRON", "RETICCTPCT" in the last 72 hours. Urinalysis    Component Value Date/Time   COLORURINE YELLOW 11/18/2021 1629   APPEARANCEUR HAZY (A) 11/18/2021 1629   LABSPEC 1.021 11/18/2021 1629   PHURINE 5.0 11/18/2021 1629   GLUCOSEU NEGATIVE 11/18/2021 1629   HGBUR NEGATIVE 11/18/2021 1629   BILIRUBINUR NEGATIVE 11/18/2021 1629   KETONESUR NEGATIVE 11/18/2021 1629   PROTEINUR 30 (A) 11/18/2021 1629   UROBILINOGEN 0.2 09/30/2012 1812   NITRITE NEGATIVE 11/18/2021 1629   LEUKOCYTESUR NEGATIVE 11/18/2021 1629   Sepsis Labs Recent Labs  Lab 11/18/21 1333 11/19/21 0156  WBC 5.3 3.6*   Microbiology Recent Results (from the past 240 hour(s))  SARS Coronavirus 2 by RT PCR (hospital order, performed in [place] [facility] hospital lab) *cepheid single result test* Anterior Nasal Swab     Status: Abnormal   Collection Time: 11/18/21  1:18 PM   Specimen: Anterior Nasal Swab  Result Value Ref Range Status   SARS Coronavirus 2 by RT PCR POSITIVE (A) NEGATIVE Final    Comment: (NOTE) SARS-CoV-2 target nucleic acids are DETECTED  SARS-CoV-2 RNA is generally detectable in upper respiratory specimens  during the acute phase of infection.  Positive results are indicative  of the presence of the identified virus, but do not rule out bacterial infection or co-infection with other pathogens not detected by the test.  Clinical correlation with patient history and  other diagnostic information is necessary to determine  patient infection status.  The expected result is negative.  Fact Sheet for Patients:   https://www.patel.info/   Fact Sheet for Healthcare Providers:   https://hall.com/    This test is not yet approved or cleared by the [place] FDA and  has been authorized for detection and/or diagnosis of SARS-CoV-2 by FDA under an Emergency Use Authorization (EUA).  This EUA will remain in effect (meaning this test can be used) for the duration of  the COVID-19 declaration under Section 564(b)(1)  of the Act, 21 U.S.C. section 360-bbb-3(b)(1), unless the authorization is terminated or revoked sooner.   Performed at [facility], [place] [address], [place], [place] [address]   Culture, blood (Routine x 2)     Status: None (Preliminary result)   Collection Time: 11/18/21  1:33 PM   Specimen: BLOOD  Result Value Ref Range Status   Specimen Description BLOOD SITE NOT SPECIFIED  Final   Special Requests   Final    BOTTLES DRAWN AEROBIC AND ANAEROBIC Blood Culture adequate volume   Culture   Final    NO GROWTH < 24 HOURS Performed at [facility], [place] [address] [place], [place] [address]    Report Status PENDING  Incomplete  Culture, blood (Routine x 2)     Status: None (Preliminary result)   Collection Time: 11/18/21  1:55 PM   Specimen: BLOOD  Result Value Ref Range Status   Specimen Description BLOOD SITE NOT SPECIFIED  Final   Special Requests   Final  BOTTLES DRAWN AEROBIC AND ANAEROBIC Blood Culture results may not be optimal due to an inadequate volume of blood received in culture bottles   Culture   Final    NO GROWTH < 24 HOURS Performed at [place] [address], [place], [place] [address]    Report Status PENDING  Incomplete     Time coordinating discharge: Over 30 minutes  SIGNED:   Phillips Climes, MD  [facility] 11/19/2021, 2:24 PM Pager   If 7PM-7AM, please contact  night-coverage [email or web address]

## 2021-11-19 NOTE — ED Notes (Signed)
Pt's daughter [name] updated

## 2021-11-19 NOTE — Discharge Instructions (Signed)
Follow with Primary MD Bonnita Nasuti, MD in 7 days   Get CBC, CMP,  checked  by Primary MD next visit.    Activity: As tolerated with Full fall precautions use walker/cane & assistance as needed   Disposition Home    Diet: Heart Healthy   On your next visit with your primary care physician please Get Medicines reviewed and adjusted.   Please request your Prim.MD to go over all Hospital Tests and Procedure/Radiological results at the follow up, please get all Hospital records sent to your Prim MD by signing hospital release before you go home.   If you experience worsening of your admission symptoms, develop shortness of breath, life threatening emergency, suicidal or homicidal thoughts you must seek medical attention immediately by calling 911 or calling your MD immediately  if symptoms less severe.  You Must read complete instructions/literature along with all the possible adverse reactions/side effects for all the Medicines you take and that have been prescribed to you. Take any new Medicines after you have completely understood and accpet all the possible adverse reactions/side effects.   Do not drive, operating heavy machinery, perform activities at heights, swimming or participation in water activities or provide baby sitting services if your were admitted for syncope or siezures until you have seen by Primary MD or a Neurologist and advised to do so again.  Do not drive when taking Pain medications.    Do not take more than prescribed Pain, Sleep and Anxiety Medications  Special Instructions: If you have smoked or chewed Tobacco  in the last 2 yrs please stop smoking, stop any regular Alcohol  and or any Recreational drug use.  Wear Seat belts while driving.   Please note  You were cared for by a hospitalist during your hospital stay. If you have any questions about your discharge medications or the care you received while you were in the hospital after you are discharged,  you can call the unit and asked to speak with the hospitalist on call if the hospitalist that took care of you is not available. Once you are discharged, your primary care physician will handle any further medical issues. Please note that NO REFILLS for any discharge medications will be authorized once you are discharged, as it is imperative that you return to your primary care physician (or establish a relationship with a primary care physician if you do not have one) for your aftercare needs so that they can reassess your need for medications and monitor your lab values.

## 2021-11-19 NOTE — ED Notes (Signed)
ED TO INPATIENT HANDOFF REPORT  ED Nurse Name and Phone #: Lanett Lasorsa RN [phone]  S Name/Age/Gender Kristin Gomez 76 y.o. female Room/Bed: 001C/001C  Code Status   Code Status: Full Code  Home/SNF/Other Home Patient oriented to: self, place, situation Is this baseline? Patient seems to be confused on time. But understands where she is and why she's here.  Triage Complete: Triage complete  Chief Complaint AKI (acute kidney injury) ([place]) [N17.9]  Triage Note Vomiting x 3 days with fever   Allergies Allergies  Allergen Reactions   Aspirin Shortness Of Breath and Other (See Comments)    Patient states that this caused an asthma attack Patient states that is causes an asthma attack.   Azithromycin Anaphylaxis   Infliximab Hives    remicade   Methotrexate Hives   Mushroom Extract Complex Anaphylaxis, Shortness Of Breath and Swelling   Nsaids Shortness Of Breath and Other (See Comments)    Asthma attack Asthma attack   Avelox [Moxifloxacin Hcl In Nacl] Swelling    Site of swelling not recalled   Diovan [Valsartan] Swelling    Site of swelling not recalled   Donepezil Diarrhea   Donepezil Hcl Other (See Comments)    Diarrhea Diarrhea   Moxifloxacin Swelling   Other Swelling    Site of swelling not recalled   Oxycodone-Acetaminophen Nausea Only   Amoxicillin Rash   Atorvastatin Swelling and Other (See Comments)    Exacerbated Psoriatic arthritis    Contrast Media [Iodinated Contrast Media] Rash   Iodine Rash   Latex Rash   Metformin Rash    ??   Metformin And Related Rash    ??   Penicillins Rash    Has patient had a PCN reaction causing immediate rash, facial/tongue/throat swelling, SOB or lightheadedness with hypotension: Yes Has patient had a PCN reaction causing severe rash involving mucus membranes or skin necrosis: No Has patient had a PCN reaction that required hospitalization: No Has patient had a PCN reaction occurring within the last 10 years: No If  all of the above answers are "NO", then may proceed with Cephalosporin use.    Percocet [Oxycodone-Acetaminophen] Nausea Only   Prednisone Rash and Other (See Comments)    Can only be tolerated with Benadryl HAS TO TAKE BENADRYL WITH PREDNISONE   Propofol Rash   Sulfa Antibiotics Nausea Only and Rash   Vicodin [Hydrocodone-Acetaminophen] Nausea Only    Level of Care/Admitting Diagnosis ED Disposition     ED Disposition  Admit   Condition  --   Comment  Hospital Area: [facility] [[id number]]  Level of Care: Med-Surg [16]  May place patient in observation at [facility] or [facility] if equivalent level of care is available:: No  Covid Evaluation: Confirmed COVID Positive  Diagnosis: AKI (acute kidney injury) [facility]) [[id number]]  Admitting Physician: Manfred Shirts  Attending Physician: Albertine Patricia [[id number]]  Bed request comments: [place] if bed is available          B Medical/Surgery History Past Medical History:  Diagnosis Date   Arthritis    "all over" (09/26/2017)   Asthma    Diabetes mellitus    Fibromyalgia    Heart disease    High cholesterol    History of blood transfusion    "related to a surgery" (09/26/2017)   Hypertension    Memory difficulty 07/26/2017   Myocardial infarction [facility])    "years ago" (09/26/2017)   Pacemaker    Psoriatic arthritis,  destructive type [facility])    Stroke ([place]) 02/2017   "affected my memory" (09/26/2017)   Type II diabetes mellitus ([place])    Past Surgical History:  Procedure Laterality Date   CARDIAC CATHETERIZATION     CATARACT EXTRACTION W/ INTRAOCULAR LENS  IMPLANT, BILATERAL Bilateral    CHOLECYSTECTOMY     COLONOSCOPY     I & D EXTREMITY  03/31/2011   Procedure: IRRIGATION AND DEBRIDEMENT EXTREMITY;  Surgeon: Tennis Must, MD;  Location: [place];  Service: Orthopedics;  Laterality: Left;  left long finger   INSERT / REPLACE / REMOVE PACEMAKER     KNEE ARTHROSCOPY Left    MASS  EXCISION  03/08/2011   Procedure: EXCISION MASS;  Surgeon: Tennis Must, MD;  Location: [place];  Service: Orthopedics;  Laterality: Left;  Excision Mass Left Long Finger and Debridement of Distal Interphalangeal  Joint   PPM GENERATOR CHANGEOUT N/A 01/18/2019   Procedure: PPM GENERATOR CHANGEOUT;  Surgeon: Deboraha Sprang, MD;  Location: [place] [facility];  Service: Cardiovascular;  Laterality: N/A;   TONSILLECTOMY     TOTAL ABDOMINAL HYSTERECTOMY     TUBAL LIGATION       A IV Location/Drains/Wounds Patient Lines/Drains/Airways Status     Active Line/Drains/Airways     Name Placement date Placement time Site Days   Peripheral IV 11/18/21 20 G 1" Right Antecubital 11/18/21  1354  Antecubital  1   External Urinary Catheter 08/20/21  1530  --  91   Incision 03/08/11 Finger (Comment which one) Left 03/08/11  0826  -- 3909   Incision 03/31/11 Hand Left 03/31/11  1124  -- 3886   Incision 03/31/11 Finger (Comment which one) Left 03/31/11  1124  -- 3886            Intake/Output Last 24 hours  Intake/Output Summary (Last 24 hours) at 11/19/2021 1354 Last data filed at 11/18/2021 1722 Gross per 24 hour  Intake 1190.51 ml  Output --  Net 1190.51 ml    Labs/Imaging Results for orders placed or performed during the hospital encounter of 11/18/21 (from the past 48 hour(s))  SARS Coronavirus 2 by RT PCR (hospital order, performed in [place] hospital lab) *cepheid single result test* Anterior Nasal Swab     Status: Abnormal   Collection Time: 11/18/21  1:18 PM   Specimen: Anterior Nasal Swab  Result Value Ref Range   SARS Coronavirus 2 by RT PCR POSITIVE (A) NEGATIVE    Comment: (NOTE) SARS-CoV-2 target nucleic acids are DETECTED  SARS-CoV-2 RNA is generally detectable in upper respiratory specimens  during the acute phase of infection.  Positive results are indicative  of the presence of the identified virus, but do not rule out bacterial infection or  co-infection with other pathogens not detected by the test.  Clinical correlation with patient history and  other diagnostic information is necessary to determine patient infection status.  The expected result is negative.  Fact Sheet for Patients:   https://www.patel.info/   Fact Sheet for Healthcare Providers:   https://hall.com/    This test is not yet approved or cleared by the [place] FDA and  has been authorized for detection and/or diagnosis of SARS-CoV-2 by FDA under an Emergency Use Authorization (EUA).  This EUA will remain in effect (meaning this test can be used) for the duration of  the COVID-19 declaration under Section 564(b)(1)  of the Act, 21 U.S.C. section 360-bbb-3(b)(1), unless the authorization is terminated  or revoked sooner.   Performed at [facility], [place] [address], [place], [place] [address]   Lactic acid, plasma     Status: None   Collection Time: 11/18/21  1:32 PM  Result Value Ref Range   Lactic Acid, Venous 1.7 0.5 - 1.9 mmol/L    Comment: Performed at [facility] [address], [place], [place] [address]  Comprehensive metabolic panel     Status: Abnormal   Collection Time: 11/18/21  1:33 PM  Result Value Ref Range   Sodium 136 135 - 145 mmol/L   Potassium 3.5 3.5 - 5.1 mmol/L   Chloride 105 98 - 111 mmol/L   CO2 21 (L) 22 - 32 mmol/L   Glucose, Bld 77 70 - 99 mg/dL    Comment: Glucose reference range applies only to samples taken after fasting for at least 8 hours.   BUN 25 (H) 8 - 23 mg/dL   Creatinine, Ser 1.76 (H) 0.44 - 1.00 mg/dL   Calcium 7.9 (L) 8.9 - 10.3 mg/dL   Total Protein 5.8 (L) 6.5 - 8.1 g/dL   Albumin 3.1 (L) 3.5 - 5.0 g/dL   AST 41 15 - 41 U/L   ALT 32 0 - 44 U/L   Alkaline Phosphatase 42 38 - 126 U/L   Total Bilirubin 0.7 0.3 - 1.2 mg/dL   GFR, Estimated 30 (L) >60 mL/min    Comment: (NOTE) Calculated using the CKD-EPI Creatinine Equation (2021)    Anion  gap 10 5 - 15    Comment: Performed at [facility], [place] [address] [place], [place] [address]  CBC with Differential     Status: Abnormal   Collection Time: 11/18/21  1:33 PM  Result Value Ref Range   WBC 5.3 4.0 - 10.5 K/uL   RBC 4.44 3.87 - 5.11 MIL/uL   Hemoglobin 14.2 12.0 - 15.0 g/dL   HCT 42.7 36.0 - 46.0 %   MCV 96.2 80.0 - 100.0 fL   MCH 32.0 26.0 - 34.0 pg   MCHC 33.3 30.0 - 36.0 g/dL   RDW 13.6 11.5 - 15.5 %   Platelets 122 (L) 150 - 400 K/uL   nRBC 0.0 0.0 - 0.2 %   Neutrophils Relative % 61 %   Neutro Abs 3.2 1.7 - 7.7 K/uL   Lymphocytes Relative 28 %   Lymphs Abs 1.5 0.7 - 4.0 K/uL   Monocytes Relative 11 %   Monocytes Absolute 0.6 0.1 - 1.0 K/uL   Eosinophils Relative 0 %   Eosinophils Absolute 0.0 0.0 - 0.5 K/uL   Basophils Relative 0 %   Basophils Absolute 0.0 0.0 - 0.1 K/uL   Immature Granulocytes 0 %   Abs Immature Granulocytes 0.01 0.00 - 0.07 K/uL    Comment: Performed at [facility] [address], [place], [place] [address]  Protime-INR     Status: Abnormal   Collection Time: 11/18/21  1:33 PM  Result Value Ref Range   Prothrombin Time 17.4 (H) 11.4 - 15.2 seconds   INR 1.4 (H) 0.8 - 1.2    Comment: (NOTE) INR goal varies based on device and disease states. Performed at [facility], [place] [address], [place], [place] [address]   Culture, blood (Routine x 2)     Status: None (Preliminary result)   Collection Time: 11/18/21  1:33 PM   Specimen: BLOOD  Result Value Ref Range   Specimen Description BLOOD SITE NOT SPECIFIED  Special Requests      BOTTLES DRAWN AEROBIC AND ANAEROBIC Blood Culture adequate volume   Culture      NO GROWTH < 24 HOURS Performed at [facility], [place] [address], [place], [place] [address]    Report Status PENDING   Lipase, blood     Status: None   Collection Time: 11/18/21  1:33 PM  Result Value Ref Range   Lipase 43 11 - 51 U/L    Comment: Performed at [facility], [place] [address] [place], [place] [address]  Culture, blood (Routine x 2)     Status: None (Preliminary result)   Collection Time: 11/18/21  1:55 PM   Specimen: BLOOD  Result Value Ref Range   Specimen Description BLOOD SITE NOT SPECIFIED    Special Requests      BOTTLES DRAWN AEROBIC AND ANAEROBIC Blood Culture results may not be optimal due to an inadequate volume of blood received in culture bottles   Culture      NO GROWTH < 24 HOURS Performed at [facility] [address] [place], [place] [address]    Report Status PENDING   Urinalysis, Routine w reflex microscopic Urine, Clean Catch     Status: Abnormal   Collection Time: 11/18/21  4:29 PM  Result Value Ref Range   Color, Urine YELLOW YELLOW   APPearance HAZY (A) CLEAR   Specific Gravity, Urine 1.021 1.005 - 1.030   pH 5.0 5.0 - 8.0   Glucose, UA NEGATIVE NEGATIVE mg/dL   Hgb urine dipstick NEGATIVE NEGATIVE   Bilirubin Urine NEGATIVE NEGATIVE   Ketones, ur NEGATIVE NEGATIVE mg/dL   Protein, ur 30 (A) NEGATIVE mg/dL   Nitrite NEGATIVE NEGATIVE   Leukocytes,Ua NEGATIVE NEGATIVE   RBC / HPF 6-10 0 - 5 RBC/hpf   WBC, UA 6-10 0 - 5 WBC/hpf   Bacteria, UA FEW (A) NONE SEEN   Squamous Epithelial / LPF 0-5 0 - 5   Mucus PRESENT     Comment: Performed at [facility], [place] [address] [place], [place] [address]  CBG monitoring, ED     Status: None   Collection Time: 11/18/21  6:27 PM  Result Value Ref Range   Glucose-Capillary 73 70 - 99 mg/dL    Comment: Glucose reference range applies only to samples taken after fasting for at least 8 hours.  CBG monitoring, ED     Status: None   Collection Time: 11/18/21  8:44 PM  Result Value Ref Range   Glucose-Capillary 84 70 - 99 mg/dL    Comment: Glucose reference range applies only to samples taken after fasting for at least 8 hours.  CBG monitoring, ED     Status: None   Collection Time: 11/18/21 10:05 PM  Result Value Ref Range   Glucose-Capillary 76 70 - 99 mg/dL    Comment:  Glucose reference range applies only to samples taken after fasting for at least 8 hours.  Lactic acid, plasma     Status: None   Collection Time: 11/19/21  1:56 AM  Result Value Ref Range   Lactic Acid, Venous 0.9 0.5 - 1.9 mmol/L    Comment: Performed at [facility] [address], [place], [place] [address]  Hemoglobin A1c     Status: None   Collection Time: 11/19/21  1:56 AM  Result Value Ref Range   Hgb A1c MFr Bld 5.0 4.8 - 5.6 %    Comment: (NOTE)  Pre diabetes:          5.7%-6.4%  Diabetes:              >6.4%  Glycemic control for   <7.0% adults with diabetes    Mean Plasma Glucose 96.8 mg/dL    Comment: Performed at [facility] [address], [place], [place] [address]  Magnesium     Status: None   Collection Time: 11/19/21  1:56 AM  Result Value Ref Range   Magnesium 1.9 1.7 - 2.4 mg/dL    Comment: Performed at [facility] [address] [place], [place] [address]  Phosphorus     Status: None   Collection Time: 11/19/21  1:56 AM  Result Value Ref Range   Phosphorus 3.6 2.5 - 4.6 mg/dL    Comment: Performed at [facility] [address] [place], [place] [address]  CBC with Differential/Platelet     Status: Abnormal   Collection Time: 11/19/21  1:56 AM  Result Value Ref Range   WBC 3.6 (L) 4.0 - 10.5 K/uL   RBC 3.68 (L) 3.87 - 5.11 MIL/uL   Hemoglobin 12.0 12.0 - 15.0 g/dL   HCT 35.3 (L) 36.0 - 46.0 %   MCV 95.9 80.0 - 100.0 fL   MCH 32.6 26.0 - 34.0 pg   MCHC 34.0 30.0 - 36.0 g/dL   RDW 13.7 11.5 - 15.5 %   Platelets  150 - 400 K/uL    PLATELET CLUMPS NOTED ON SMEAR, COUNT APPEARS ADEQUATE    Comment: Immature Platelet Fraction may be clinically indicated, consider ordering this additional test [id number]    nRBC 0.0 0.0 - 0.2 %   Neutrophils Relative % 40 %   Neutro Abs 1.4 (L) 1.7 - 7.7 K/uL   Lymphocytes Relative 46 %   Lymphs Abs 1.6 0.7 - 4.0 K/uL   Monocytes Relative 14 %   Monocytes Absolute 0.5 0.1 - 1.0 K/uL    Eosinophils Relative 0 %   Eosinophils Absolute 0.0 0.0 - 0.5 K/uL   Basophils Relative 0 %   Basophils Absolute 0.0 0.0 - 0.1 K/uL   Immature Granulocytes 0 %   Abs Immature Granulocytes 0.01 0.00 - 0.07 K/uL    Comment: Performed at [facility], [place] [address], [place], [place] [address]  Comprehensive metabolic panel     Status: Abnormal   Collection Time: 11/19/21  1:56 AM  Result Value Ref Range   Sodium 137 135 - 145 mmol/L   Potassium 3.5 3.5 - 5.1 mmol/L   Chloride 109 98 - 111 mmol/L   CO2 19 (L) 22 - 32 mmol/L   Glucose, Bld 87 70 - 99 mg/dL    Comment: Glucose reference range applies only to samples taken after fasting for at least 8 hours.   BUN 21 8 - 23 mg/dL   Creatinine, Ser 1.38 (H) 0.44 - 1.00 mg/dL   Calcium 7.1 (L) 8.9 - 10.3 mg/dL   Total Protein 4.6 (L) 6.5 - 8.1 g/dL   Albumin 2.5 (L) 3.5 - 5.0 g/dL   AST 38 15 - 41 U/L   ALT 25 0 - 44 U/L   Alkaline Phosphatase 31 (L) 38 - 126 U/L   Total Bilirubin 0.7 0.3 - 1.2 mg/dL   GFR, Estimated 40 (L) >60 mL/min    Comment: (NOTE) Calculated using the CKD-EPI Creatinine Equation (2021)    Anion gap 9 5 - 15    Comment: Performed  at [facility], [place] [address] [place], [place] [address]  CBG monitoring, ED     Status: None   Collection Time: 11/19/21  8:03 AM  Result Value Ref Range   Glucose-Capillary 73 70 - 99 mg/dL    Comment: Glucose reference range applies only to samples taken after fasting for at least 8 hours.  CBG monitoring, ED     Status: None   Collection Time: 11/19/21 12:37 PM  Result Value Ref Range   Glucose-Capillary 82 70 - 99 mg/dL    Comment: Glucose reference range applies only to samples taken after fasting for at least 8 hours.   CT ABDOMEN PELVIS WO CONTRAST  Result Date: 11/18/2021 CLINICAL DATA:  Nausea, vomiting, abdominal pain EXAM: CT ABDOMEN AND PELVIS WITHOUT CONTRAST TECHNIQUE: Multidetector CT imaging of the abdomen and pelvis was performed following the  standard protocol without IV contrast. Unenhanced CT was performed per clinician order. Lack of IV contrast limits sensitivity and specificity, especially for evaluation of abdominal/pelvic solid viscera. RADIATION DOSE REDUCTION: This exam was performed according to the departmental dose-optimization program which includes automated exposure control, adjustment of the mA and/or kV according to patient size and/or use of iterative reconstruction technique. COMPARISON:  09/26/2017 FINDINGS: Lower chest: Dependent hypoventilatory changes within the lower lobes. Multi lead cardiac pacer. Coronary artery atherosclerosis. Hepatobiliary: Indeterminate subcentimeter hypodensities within the right lobe liver, largest measuring 5 mm image 20/3. Otherwise unremarkable unenhanced appearance of the liver. No biliary duct dilation. Previous cholecystectomy. Pancreas: Unremarkable unenhanced appearance. Spleen: Unremarkable unenhanced appearance. Adrenals/Urinary Tract: No urinary tract calculi or obstructive uropathy within either kidney. The adrenals and bladder are unremarkable. Stomach/Bowel: No bowel obstruction or ileus. No bowel wall thickening or inflammatory change. Vascular/Lymphatic: Aortic atherosclerosis. No enlarged abdominal or pelvic lymph nodes. Reproductive: Status post hysterectomy. No adnexal masses. Other: No free fluid or free intraperitoneal gas. No abdominal wall hernia. Musculoskeletal: No acute or destructive bony lesions. Chronic bilateral rib fractures. Chronic appearing compression deformities at T6, T7, and L1, with prior vertebral augmentation at the L1 level again noted. IMPRESSION: 1. No acute intra-abdominal or intrapelvic process. 2.  Aortic Atherosclerosis (ICD10-I70.0). Electronically Signed   By: Randa Ngo M.D.   On: 11/18/2021 15:06   DG Chest Portable 1 View  Result Date: 11/18/2021 CLINICAL DATA:  Fever and vomiting for 3 days in a 76 year old female. EXAM: PORTABLE CHEST 1 VIEW  COMPARISON:  August 20, 2021 FINDINGS: EKG leads project over the chest. Multi lead pacer defibrillator remains in place, power pack over the LEFT chest. Cardiomediastinal contours and hilar structures are stable with signs of cardiac enlargement not well assessed due to low lung volumes. No signs of lobar consolidation. No pneumothorax. Fissural fluid in the RIGHT chest and mildly obscured RIGHT hemidiaphragm. No gross pulmonary edema. Limited assessment of skeletal structures with potential midthoracic compression fracture that is new compared to recent imaging from July. Rib fractures of indeterminate age along the LEFT chest not clearly visible on previous imaging posterolateral seventh rib. IMPRESSION: 1. Rib fractures of indeterminate age along the LEFT chest not clearly visible on previous imaging from July. Correlate with point tenderness in this area. 2. Low lung volumes with fissural fluid in the RIGHT chest and mildly obscured RIGHT hemidiaphragm. Findings raise the question of small pleural effusion with associated airspace disease. In the appropriate clinical setting this could represent RIGHT lower lobe pneumonia but is not well evaluated. 3. Potential midthoracic compression fracture that is new compared to recent  imaging from July. Electronically Signed   By: Zetta Bills M.D.   On: 11/18/2021 14:43    Pending Labs Unresulted Labs (From admission, onward)     Start     Ordered   11/19/21 0500  CBC with Differential/Platelet  Daily at 5am,   R      11/18/21 2002   11/19/21 0500  Comprehensive metabolic panel  Daily at 5am,   R      11/18/21 2002   11/18/21 1406  Gastrointestinal Panel by PCR , Stool  (Gastrointestinal Panel by PCR, Stool                                                                                                                                                     **Does Not include CLOSTRIDIUM DIFFICILE testing. **If CDIFF testing is needed, place order from the "C  Difficile Testing" order set.**)  Once,   URGENT        11/18/21 1405   11/18/21 1343  Urine Culture  Once,   URGENT       Question:  Indication  Answer:  Sepsis   11/18/21 1343            Vitals/Pain Today's Vitals   11/19/21 0925 11/19/21 1100 11/19/21 1315 11/19/21 1352  BP:  120/63 122/69   Pulse:  77 80   Resp:  (!) 26 13   Temp: (!) 97.5 F (36.4 C)   99.5 F (37.5 C)  TempSrc:    Oral  SpO2:  93% 93%   Weight:      Height:      PainSc:        Isolation Precautions Airborne and Contact precautions  Medications Medications  0.9 %  sodium chloride infusion ( Intravenous Not Given 11/18/21 1636)  0.9 %  sodium chloride infusion ( Intravenous New Bag/Given 11/19/21 0800)  rosuvastatin (CRESTOR) tablet 20 mg (20 mg Oral Given 11/19/21 0920)  memantine (NAMENDA) tablet 10 mg (10 mg Oral Given 11/19/21 0920)  pantoprazole (PROTONIX) EC tablet 40 mg (40 mg Oral Given 11/19/21 0920)  apixaban (ELIQUIS) tablet 5 mg (5 mg Oral Given 11/19/21 0920)  clonazePAM (KLONOPIN) tablet 0.5 mg (0.5 mg Oral Given 11/18/21 2313)  cholecalciferol (VITAMIN D3) 25 MCG (1000 UNIT) tablet 2,000 Units (2,000 Units Oral Given 11/19/21 0920)  ipratropium-albuterol (DUONEB) 0.5-2.5 (3) MG/3ML nebulizer solution 3 mL (has no administration in time range)  albuterol (VENTOLIN HFA) 108 (90 Base) MCG/ACT inhaler 2 puff (has no administration in time range)  molnupiravir EUA (LAGEVRIO) capsule 800 mg (800 mg Oral Given 11/19/21 1243)  acetaminophen (TYLENOL) tablet 650 mg (650 mg Oral Given 11/18/21 2012)  ondansetron (ZOFRAN) tablet 4 mg ( Oral See Alternative 11/18/21 1957)    Or  ondansetron (ZOFRAN) injection 4 mg (4 mg Intravenous Given 11/18/21 1957)  insulin  aspart (novoLOG) injection 0-5 Units ( Subcutaneous Not Given 11/18/21 2208)  insulin aspart (novoLOG) injection 0-9 Units ( Subcutaneous Not Given 11/19/21 1239)  levothyroxine (SYNTHROID) tablet 25 mcg (25 mcg Oral Given 11/19/21 0530)  LORazepam  (ATIVAN) injection 0.5 mg (has no administration in time range)  carvedilol (COREG) tablet 12.5 mg (12.5 mg Oral Given 11/19/21 0920)  sodium chloride 0.9 % bolus 1,000 mL (0 mLs Intravenous Stopped 11/18/21 1559)  ondansetron (ZOFRAN) injection 4 mg (4 mg Intravenous Given 11/18/21 1411)  ondansetron (ZOFRAN) injection 4 mg (4 mg Intravenous Given 11/18/21 1626)  potassium chloride SA (KLOR-CON M) CR tablet 40 mEq (40 mEq Oral Given 11/18/21 2001)    Mobility walks with person assist Moderate fall risk   Focused Assessments Pulmonary Assessment Handoff:  Lung sounds: L Breath Sounds: Clear R Breath Sounds: Diminished O2 Device: Room Air      R Recommendations: See Admitting Provider Note  Report given to: Latanya Maudlin   Additional Notes:

## 2021-11-19 NOTE — Care Management Obs Status (Signed)
[facility] NOTIFICATION   Patient Details  Name: THAYER INABINET MRN: [id number] Date of Birth: 1945/10/25   Medicare Observation Status Notification Given:  Yes    Fuller Mandril, RN 11/19/2021, 2:45 PM

## 2021-11-19 NOTE — Progress Notes (Signed)
Transition of Care [facility]) - Emergency Department Mini Assessment   Patient Details  Name: Kristin Gomez MRN: [id number] Date of Birth: 24-May-1945  Transition of Care [facility]) CM/SW Contact:    Fuller Mandril, RN Phone Number: 11/19/2021, 2:41 PM   Clinical Narrative: Jason Coop. Clydene Laming, RN, BSN, [place] [phone] RNCM spoke with pt daughter ([place] discharge planning for [place]. Offered pt medicare.gov list of home health agencies to choose from.  Pt chose [facility] to render services. RNCM awaiting phone call from [facility] to confirm that they will accept referral.    ED Mini Assessment:       Barrier interventions: home health services          Patient Contact and Communications     Spoke with: Futures trader Date: 11/19/21,   Contact time: 28 Contact Phone Number: (P) [phone]    Patient states their goals for this hospitalization and ongoing recovery are:: (P) go home      Admission diagnosis:  AKI (acute kidney injury) ([place]) [N17.9] Patient Active Problem List   Diagnosis Date Noted   AKI (acute kidney injury) ([place]) 11/18/2021   NICM (nonischemic cardiomyopathy) ([place]) 04/21/2019   Biventricular cardiac pacemaker in situ 04/21/2019   CVA (cerebral vascular accident) ([place]) 08/29/2018   Colitis 09/26/2017   ARF (acute renal failure) ([place]) 09/26/2017   Diabetes mellitus type 2 in nonobese ([place]) 09/26/2017   Chronic CHF (congestive heart failure) ([place]) 09/26/2017   PAF (paroxysmal atrial fibrillation) ([place]) 09/26/2017   Memory difficulty 07/26/2017   Mucoid cyst of joint 04/04/2011   Goiter 11/25/2010   Bartholin's gland cyst 11/25/2010   Hypertension 11/25/2010   Fibromyalgia 11/25/2010   PCP:  Bonnita Nasuti, MD Pharmacy:   [facility] [address] [place] [address] Phone: [phone] Fax: [phone]  [facility]  Pharmacy [address] [address] Phone: [phone] Fax: [phone]

## 2021-11-19 NOTE — TOC Transition Note (Signed)
Transition of Care [facility]) - CM/SW Discharge Note   Patient Details  Name: Kristin Gomez MRN: [id number] Date of Birth: 1945-08-29  Transition of Care [facility]) CM/SW Contact:  Verdell Carmine, RN Phone Number: 11/19/2021, 2:55 PM   Clinical Narrative:    Floreen Comber for patient , PT orders placed by MD.  Due to COVID status will see the patient Tuesday         Patient Goals and CMS Choice Patient states their goals for this hospitalization and ongoing recovery are:: go home      Discharge Placement             Home with Home Health          Discharge Plan and Services                          HH Arranged: PT [facility] Agency: [place] Date [place]: 11/19/21 Time [place]: [place] Representative spoke with at [address]: [place] ([place]) Interventions     Readmission Risk Interventions     No data to display

## 2021-11-19 NOTE — Evaluation (Signed)
Physical Therapy Evaluation Patient Details Name: Kristin Gomez MRN: [id number] DOB: 12/06/1945 Today's Date: 11/19/2021  History of Present Illness  76 y.o. female, with history of chronic systolic heart failure status post biventricular pacemaker placement nonischemic cardiomyopathy, CVA, hyperlipidemia, dementia, hypothyroidism,   -Patient presents to ED secondary to complaints of abdominal pain, nausea, vomiting, diarrhea and fever.  Covid 19 +  Clinical Impression  Pt admitted secondary to problem above with deficits below. Pt requiring min to min guard A for mobility tasks. Mild instability noted, especially with backwards walking. Educated about using RW at home for increased safety. Would benefit from HHPT at d/c. Noted MD canceled order following session. If pt should remain in hospital, would benefit from continued therapy. Please re-order as appropriate.        Recommendations for follow up therapy are one component of a multi-disciplinary discharge planning process, led by the attending physician.  Recommendations may be updated based on patient status, additional functional criteria and insurance authorization.  Follow Up Recommendations Home health PT      Assistance Recommended at Discharge Intermittent Supervision/Assistance  Patient can return home with the following  A little help with bathing/dressing/bathroom;Help with stairs or ramp for entrance;Assist for transportation;Assistance with cooking/housework    Equipment Recommendations None recommended by PT  Recommendations for Other Services       Functional Status Assessment Patient has had a recent decline in their functional status and demonstrates the ability to make significant improvements in function in a reasonable and predictable amount of time.     Precautions / Restrictions Precautions Precautions: Fall Restrictions Weight Bearing Restrictions: No      Mobility  Bed Mobility Overal bed mobility:  Needs Assistance Bed Mobility: Supine to Sit, Sit to Supine     Supine to sit: Min assist Sit to supine: Min assist   General bed mobility comments: ASsist for trunk and LE assist    Transfers Overall transfer level: Needs assistance Equipment used: 1 person hand held assist Transfers: Sit to/from Stand Sit to Stand: Min guard           General transfer comment: Min guard for safety.    Ambulation/Gait Ambulation/Gait assistance: Min guard, Min assist Gait Distance (Feet): 10 Feet Assistive device: 1 person hand held assist Gait Pattern/deviations: Step-through pattern, Decreased stride length Gait velocity: Decreased     General Gait Details: Pt requiring min guard up to min A for mobility in room. Requiring increased assist with backward walking secondary to instability. Educated about using RW at home to increase safety.  Stairs            Wheelchair Mobility    Modified Rankin (Stroke Patients Only)       Balance Overall balance assessment: Needs assistance Sitting-balance support: Feet unsupported Sitting balance-Leahy Scale: Fair     Standing balance support: Single extremity supported Standing balance-Leahy Scale: Fair                               Pertinent Vitals/Pain Pain Assessment Pain Assessment: Faces Faces Pain Scale: Hurts even more Pain Location: R upper arm Pain Descriptors / Indicators: Tender Pain Intervention(s): Limited activity within patient's tolerance, Monitored during session, Repositioned    Home Living Family/patient expects to be discharged to:: Private residence Living Arrangements: Children Available Help at Discharge: Family;Available 24 hours/day Type of Home: House Home Access: Ramped entrance       Home Layout: One  level Home Equipment: Conservation officer, nature (2 wheels);BSC/3in1      Prior Function Prior Level of Function : Independent/Modified Independent                     Hand  Dominance   Dominant Hand: Right    Extremity/Trunk Assessment   Upper Extremity Assessment Upper Extremity Assessment: Defer to OT evaluation    Lower Extremity Assessment Lower Extremity Assessment: Generalized weakness    Cervical / Trunk Assessment Cervical / Trunk Assessment: Kyphotic  Communication   Communication: No difficulties  Cognition Arousal/Alertness: Awake/alert Behavior During Therapy: WFL for tasks assessed/performed Overall Cognitive Status: History of cognitive impairments - at baseline                                 General Comments: following commands well, able to describe home setting.  STM deficts        General Comments      Exercises     Assessment/Plan    PT Assessment Patient needs continued PT services  PT Problem List Decreased strength;Decreased activity tolerance;Decreased mobility;Decreased balance;Decreased cognition;Decreased knowledge of use of DME;Decreased safety awareness;Decreased knowledge of precautions       PT Treatment Interventions DME instruction;Gait training;Functional mobility training;Stair training;Therapeutic activities;Therapeutic exercise;Balance training;Patient/family education    PT Goals (Current goals can be found in the Care Plan section)  Acute Rehab PT Goals Patient Stated Goal: to go home PT Goal Formulation: With patient Time For Goal Achievement: 12/03/21 Potential to Achieve Goals: Good    Frequency Min 3X/week     Co-evaluation               AM-PAC PT "6 Clicks" Mobility  Outcome Measure Help needed turning from your back to your side while in a flat bed without using bedrails?: None Help needed moving from lying on your back to sitting on the side of a flat bed without using bedrails?: A Little Help needed moving to and from a bed to a chair (including a wheelchair)?: A Little Help needed standing up from a chair using your arms (e.g., wheelchair or bedside chair)?: A  Little Help needed to walk in hospital room?: A Little Help needed climbing 3-5 steps with a railing? : A Lot 6 Click Score: 18    End of Session Equipment Utilized During Treatment: Gait belt Activity Tolerance: Patient tolerated treatment well Patient left: in bed;with call bell/phone within reach (on stretcher in ED) Nurse Communication: Mobility status PT Visit Diagnosis: Unsteadiness on feet (R26.81);Muscle weakness (generalized) (M62.81)    Time: 1415-1430 PT Time Calculation (min) (ACUTE ONLY): 15 min   Charges:   PT Evaluation $PT Eval Low Complexity: 1 Low          Reuel Derby, PT, DPT  Acute Rehabilitation Services  Office: [phone]   Rudean Hitt 11/19/2021, 3:57 PM

## 2021-11-20 LAB — URINE CULTURE: Culture: NO GROWTH

## 2021-11-23 LAB — CULTURE, BLOOD (ROUTINE X 2)
Culture: NO GROWTH
Culture: NO GROWTH
Special Requests: ADEQUATE

## 2021-11-26 NOTE — Telephone Encounter (Signed)
ATC x1.  LVM to return call.  Needs 30 minute slot for a consult ([place]).

## 2021-12-16 ENCOUNTER — Encounter: Payer: Self-pay | Admitting: Pulmonary Disease

## 2021-12-16 ENCOUNTER — Ambulatory Visit: Payer: Medicare HMO | Admitting: Pulmonary Disease

## 2021-12-16 VITALS — BP 116/64 | HR 68 | Temp 98.1°F | Ht 65.0 in | Wt 149.4 lb

## 2021-12-16 DIAGNOSIS — I509 Heart failure, unspecified: Secondary | ICD-10-CM | POA: Diagnosis not present

## 2021-12-16 DIAGNOSIS — J4540 Moderate persistent asthma, uncomplicated: Secondary | ICD-10-CM | POA: Diagnosis not present

## 2021-12-16 MED ORDER — BUDESONIDE 0.5 MG/2ML IN SUSP: 0.5000 mg | Freq: Two times a day (BID) | RESPIRATORY_TRACT | 6 refills | Status: AC

## 2021-12-16 MED ORDER — ALBUTEROL SULFATE (2.5 MG/3ML) 0.083% IN NEBU: 2.5000 mg | INHALATION_SOLUTION | RESPIRATORY_TRACT | 2 refills | Status: DC | PRN

## 2021-12-16 MED ORDER — ARFORMOTEROL TARTRATE 15 MCG/2ML IN NEBU: 15.0000 ug | INHALATION_SOLUTION | Freq: Two times a day (BID) | RESPIRATORY_TRACT | 6 refills | Status: DC

## 2021-12-16 NOTE — Progress Notes (Signed)
$'@Patient'h$  ID: Kristin Gomez, female    DOB: [birth year], 76 y.o.   MRN: [id number]  Chief Complaint  Patient presents with   Consult    Post hospital visit.  Dx COVID, now SOB and wheezing    Referring provider: Bonnita Nasuti, MD  HPI:   76 y.o. whom are seen in consultation for evaluation of dyspnea exertion, wheeze, cough.  Discharge summary 11/19/2021 reviewed.  Patient developed nausea vomiting.  Weakness.  Brought to ED.  Found to be COVID-positive.  AKI.  Admitted to the hospital.  Chest x-ray in the ED on my review and interpretation demonstrates chronic changes with what appears to be chronic left-sided pleural effusion.  CT abdomen pelvis revealed no evidence of pleural effusion on lung fields visualized on my review and interpretation.  She was hydrated.  Labs improved.  She was found to be COVID-positive.  Is felt that this was causing her GI symptoms.  She was treated with antiviral therapy.  After discharging back home she developed cough.  Wheeze.  Shortness of breath.  Slightly worsened over time.  Has been prescribed albuterol and using nebulizer with some relief.  Also has albuterol inhaler that her daughter helps administer.  She reports a history of diagnosed with asthma in the past.  Not on inhaler therapy or maintenance therapy prior to COVID infection/hospitalization.  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 A-fib    Questionaires / Pulmonary Flowsheets:   ACT:      No data to display          MMRC:     No data to display          Epworth:      No data to display          Tests:    FENO:  No results found for: "NITRICOXIDE"  PFT:     No data to display          WALK:      No data to display          Imaging: Personally reviewed and as per EMR discussion this note CT ABDOMEN PELVIS WO CONTRAST  Result Date: 11/18/2021 CLINICAL DATA:  Nausea, vomiting, abdominal pain EXAM: CT ABDOMEN AND PELVIS WITHOUT CONTRAST TECHNIQUE: Multidetector CT imaging of the abdomen and pelvis was performed following the standard protocol without IV contrast. Unenhanced CT was performed per clinician order. Lack of IV contrast limits sensitivity and specificity, especially for evaluation of abdominal/pelvic solid viscera. RADIATION DOSE REDUCTION: This exam was performed according to the departmental dose-optimization program which includes automated exposure control, adjustment of the mA and/or kV according to patient size and/or use of iterative reconstruction technique. COMPARISON:  09/26/2017 FINDINGS: Lower chest: Dependent hypoventilatory changes within the lower lobes. Multi lead cardiac pacer. Coronary artery atherosclerosis. Hepatobiliary: Indeterminate subcentimeter hypodensities within the right lobe liver, largest measuring 5 mm image 20/3. Otherwise unremarkable unenhanced appearance of the liver. No biliary duct dilation. Previous cholecystectomy. Pancreas: Unremarkable unenhanced appearance. Spleen: Unremarkable unenhanced appearance. Adrenals/Urinary Tract: No urinary tract calculi or obstructive uropathy within either kidney. The adrenals and bladder are unremarkable. Stomach/Bowel: No bowel obstruction or ileus. No bowel wall thickening or inflammatory change. Vascular/Lymphatic: Aortic atherosclerosis. No  enlarged abdominal or pelvic lymph nodes. Reproductive: Status post hysterectomy. No adnexal masses. Other: No free fluid or free intraperitoneal gas. No abdominal wall hernia. Musculoskeletal: No acute or destructive bony lesions. Chronic bilateral rib fractures.  Chronic appearing compression deformities at T6, T7, and L1, with prior vertebral augmentation at the L1 level again noted. IMPRESSION: 1. No acute intra-abdominal or intrapelvic process. 2.  Aortic Atherosclerosis (ICD10-I70.0). Electronically Signed   By: Randa Ngo M.D.   On: 11/18/2021 15:06   DG Chest Portable 1 View  Result Date: 11/18/2021 CLINICAL DATA:  Fever and vomiting for 3 days in a 76 year old female. EXAM: PORTABLE CHEST 1 VIEW COMPARISON:  August 20, 2021 FINDINGS: EKG leads project over the chest. Multi lead pacer defibrillator remains in place, power pack over the LEFT chest. Cardiomediastinal contours and hilar structures are stable with signs of cardiac enlargement not well assessed due to low lung volumes. No signs of lobar consolidation. No pneumothorax. Fissural fluid in the RIGHT chest and mildly obscured RIGHT hemidiaphragm. No gross pulmonary edema. Limited assessment of skeletal structures with potential midthoracic compression fracture that is new compared to recent imaging from July. Rib fractures of indeterminate age along the LEFT chest not clearly visible on previous imaging posterolateral seventh rib. IMPRESSION: 1. Rib fractures of indeterminate age along the LEFT chest not clearly visible on previous imaging from July. Correlate with point tenderness in this area. 2. Low lung volumes with fissural fluid in the RIGHT chest and mildly obscured RIGHT hemidiaphragm. Findings raise the question of small pleural effusion with associated airspace disease. In the appropriate clinical setting this could represent RIGHT lower lobe pneumonia but is not well evaluated. 3. Potential midthoracic compression fracture that is new compared to recent imaging from July. Electronically Signed   By: Zetta Bills M.D.   On: 11/18/2021 14:43    Lab Results: Personally reviewed CBC    Component Value Date/Time   WBC 3.6 (L) 11/19/2021 0156   RBC 3.68 (L) 11/19/2021 0156   HGB 12.0  11/19/2021 0156   HGB 14.9 04/22/2021 1450   HCT 35.3 (L) 11/19/2021 0156   HCT 43.6 04/22/2021 1450   PLT  11/19/2021 0156    PLATELET CLUMPS NOTED ON SMEAR, COUNT APPEARS ADEQUATE   PLT 191 04/22/2021 1450   MCV 95.9 11/19/2021 0156   MCV 94 04/22/2021 1450   MCH 32.6 11/19/2021 0156   MCHC 34.0 11/19/2021 0156   RDW 13.7 11/19/2021 0156   RDW 12.6 04/22/2021 1450   LYMPHSABS 1.6 11/19/2021 0156   MONOABS 0.5 11/19/2021 0156   EOSABS 0.0 11/19/2021 0156   BASOSABS 0.0 11/19/2021 0156    BMET    Component Value Date/Time   NA 137 11/19/2021 0156   NA 147 (H) 04/22/2021 1450   K 3.5 11/19/2021 0156   CL 109 11/19/2021 0156   CO2 19 (L) 11/19/2021 0156   GLUCOSE 87 11/19/2021 0156   BUN 21 11/19/2021 0156   BUN 15 04/22/2021 1450   CREATININE 1.38 (H) 11/19/2021 0156   CALCIUM 7.1 (L) 11/19/2021 0156   GFRNONAA 40 (L) 11/19/2021 0156   GFRAA 62 01/03/2020 1416    BNP No results found for: "BNP"  ProBNP    Component Value Date/Time   PROBNP 443 (H) 05/09/2019 1538    Specialty Problems   None   Allergies  Allergen Reactions   Aspirin Shortness Of Breath and Other (See Comments)    Patient states that this caused an asthma attack Patient states  that is causes an asthma attack.   Azithromycin Anaphylaxis   Infliximab Hives    remicade   Methotrexate Hives   Mushroom Extract Complex Anaphylaxis, Shortness Of Breath and Swelling   Nsaids Shortness Of Breath and Other (See Comments)    Asthma attack Asthma attack   Avelox [Moxifloxacin Hcl In Nacl] Swelling    Site of swelling not recalled   Diovan [Valsartan] Swelling    Site of swelling not recalled   Donepezil Diarrhea   Donepezil Hcl Other (See Comments)    Diarrhea Diarrhea   Moxifloxacin Swelling   Other Swelling    Site of swelling not recalled   Oxycodone-Acetaminophen Nausea Only   Amoxicillin Rash   Atorvastatin Swelling and Other (See Comments)    Exacerbated Psoriatic arthritis     Contrast Media [Iodinated Contrast Media] Rash   Iodine Rash   Latex Rash   Metformin Rash    ??   Metformin And Related Rash    ??   Penicillins Rash    Has patient had a PCN reaction causing immediate rash, facial/tongue/throat swelling, SOB or lightheadedness with hypotension: Yes Has patient had a PCN reaction causing severe rash involving mucus membranes or skin necrosis: No Has patient had a PCN reaction that required hospitalization: No Has patient had a PCN reaction occurring within the last 10 years: No If all of the above answers are "NO", then may proceed with Cephalosporin use.    Percocet [Oxycodone-Acetaminophen] Nausea Only   Prednisone Rash and Other (See Comments)    Can only be tolerated with Benadryl HAS TO TAKE BENADRYL WITH PREDNISONE   Propofol Rash   Sulfa Antibiotics Nausea Only and Rash   Vicodin [Hydrocodone-Acetaminophen] Nausea Only     There is no immunization history on file for this patient.  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    albuterol (PROVENTIL) (2.5 MG/3ML) 0.083% nebulizer solution Take 3 mLs  (2.5 mg total) by nebulization every 4 (four) hours as needed for wheezing or shortness of breath.   apixaban (ELIQUIS) 5 MG TABS tablet Take 1 tablet by mouth twice daily   arformoterol (BROVANA) 15 MCG/2ML NEBU Take 2 mLs (15 mcg total) by nebulization 2 (two) times daily.   budesonide (PULMICORT) 0.5 MG/2ML nebulizer solution Take 2 mLs (0.5 mg total) by nebulization 2 (two) times daily.   carvedilol (COREG) 12.5 MG tablet Take 1 tablet (12.5 mg total) by mouth 2 (two) times daily with a meal.   Cholecalciferol (VITAMIN D3) 50 MCG (2000 UT) capsule  Take 2,000 Units by mouth daily.    Cholecalciferol 50 MCG (2000 UT) CAPS 1 capsule Orally Once a day   clonazePAM (KLONOPIN) 0.5 MG tablet Take 0.5 mg by mouth 2 (two) times daily as needed.   diphenhydramine-acetaminophen (TYLENOL PM) 25-500 MG TABS tablet Take 1-2 tablets by mouth at bedtime as needed (pain).   glipiZIDE (GLUCOTROL XL) 5 MG 24 hr tablet Take 5 mg by mouth at bedtime.   Infant Care Products [facility]) OINT Apply 1 Application topically daily as needed (inflammation).   ipratropium-albuterol (DUONEB) 0.5-2.5 (3) MG/3ML SOLN Take 3 mLs by nebulization every 6 (six) hours as needed (for shortness of breath or wheezing).    levothyroxine (SYNTHROID) 25 MCG tablet Take 25 mcg by mouth daily before breakfast.   memantine (NAMENDA) 10 MG tablet Take 1 tablet by mouth twice daily   methylPREDNISolone (MEDROL) 4 MG tablet Take 1 mg by mouth as needed (Fibermyalgia).   omeprazole (PRILOSEC) 20 MG capsule Take 20 mg by mouth daily as needed (acid reflux).    potassium chloride SA (K-DUR,KLOR-CON) 20 MEQ tablet Take 20 mEq by mouth daily.   rosuvastatin (CRESTOR) 20 MG tablet Take 1 tablet (20 mg total) by mouth daily.   No facility-administered encounter medications on file as of 12/16/2021.     Review of Systems  Review of Systems  No chest pain with exertion.  No orthopnea or PND.  Comprehensive review of systems otherwise  negative. Physical Exam  BP 116/64 (BP Location: Left Arm, Patient Position: Sitting, Cuff Size: Normal)   Pulse 68   Temp 98.1 F (36.7 C) (Oral)   Ht '5\' 5"'$  (1.651 m)   Wt 149 lb 6.4 oz (67.8 kg)   SpO2 98%   BMI 24.86 kg/m   Wt Readings from Last 5 Encounters:  12/16/21 149 lb 6.4 oz (67.8 kg)  11/18/21 150 lb (68 kg)  08/20/21 150 lb 5.7 oz (68.2 kg)  07/15/21 150 lb 4.8 oz (68.2 kg)  04/22/21 151 lb (68.5 kg)    BMI Readings from Last 5 Encounters:  12/16/21 24.86 kg/m  11/18/21 25.75 kg/m  08/20/21 25.81 kg/m  07/15/21 25.80 kg/m  04/22/21 25.92 kg/m     Physical Exam General:: Frail, sitting in chair, Eyes: EOMI, no icterus Neck: Supple, no JVP Pulmonary: Scattered wheeze primarily in the right lower lung field, good air movement, normal work of breathing Cardiovascular: Warm, no edema noted Abdomen: Nondistended no masses present MSK: No synovitis, no joint effusion Neuro: Ambulates with assistance of walker, slow gait, no focal deficit Psych: Inattentive at times, flat affect  Assessment & Plan:   Cough, wheeze, dyspnea exertion: Seems new or worse since COVID infection early October 2023.  Mild wheeze on exam.  Mild improvement albuterol at home.  High suspicion for post viral syndrome cough etc.  Possible development of asthma if symptoms linger in the future.  Due to her cognitive impairment as well as her arthritis, she is unable to use traditional inhalers.  Nebulized budesonide arformoterol twice daily prescribed today.  Albuterol as needed prescribed today.   Return in about 6 weeks (around 01/27/2022).   Lanier Clam, MD 12/16/2021   This appointment required 61 minutes of patient care (this includes precharting, chart review, review of results, face-to-face care, etc.).

## 2021-12-16 NOTE — Patient Instructions (Addendum)
Nice to meet you  I think some of the symptoms you are experiencing are prolonged from your COVID infection  Sometimes the symptoms can linger for a very long time and cause asthma  To help your symptoms get better sooner, I recommend using budesonide and arformoterol nebulized twice a day every day.  Continue nebulized albuterol/ipratropium or DuoNebs as needed for wheeze or shortness of breath.  I also sent a new prescription for albuterol nebulizer solution in case you need it in the future.  You can use this as needed as well.  Return to clinic in 4 to 6 weeks or sooner as needed with Dr. Silas Flood

## 2022-01-05 ENCOUNTER — Other Ambulatory Visit (HOSPITAL_BASED_OUTPATIENT_CLINIC_OR_DEPARTMENT_OTHER): Payer: Self-pay | Admitting: Family

## 2022-01-13 ENCOUNTER — Ambulatory Visit: Payer: Medicare HMO | Admitting: Pulmonary Disease

## 2022-01-18 ENCOUNTER — Ambulatory Visit (INDEPENDENT_AMBULATORY_CARE_PROVIDER_SITE_OTHER): Payer: Medicare HMO

## 2022-01-18 ENCOUNTER — Ambulatory Visit: Payer: Medicare HMO | Admitting: Internal Medicine

## 2022-01-18 DIAGNOSIS — I428 Other cardiomyopathies: Secondary | ICD-10-CM | POA: Diagnosis not present

## 2022-01-18 LAB — CUP PACEART REMOTE DEVICE CHECK
Battery Remaining Longevity: 41 mo
Battery Remaining Percentage: 50 %
Battery Voltage: 2.96 V
Brady Statistic AP VP Percent: 27 %
Brady Statistic AP VS Percent: 1 %
Brady Statistic AS VP Percent: 73 %
Brady Statistic AS VS Percent: 1 %
Brady Statistic RA Percent Paced: 26 %
Date Time Interrogation Session: 20231205020014
Implantable Lead Connection Status: 753985
Implantable Lead Connection Status: 753985
Implantable Lead Connection Status: 753985
Implantable Lead Implant Date: 20081110
Implantable Lead Implant Date: 20081110
Implantable Lead Implant Date: 20081110
Implantable Lead Location: 753858
Implantable Lead Location: 753859
Implantable Lead Location: 753860
Implantable Pulse Generator Implant Date: 20201204
Lead Channel Impedance Value: 540 Ohm
Lead Channel Impedance Value: 540 Ohm
Lead Channel Impedance Value: 830 Ohm
Lead Channel Pacing Threshold Amplitude: 0.625 V
Lead Channel Pacing Threshold Amplitude: 1.125 V
Lead Channel Pacing Threshold Amplitude: 2 V
Lead Channel Pacing Threshold Pulse Width: 0.4 ms
Lead Channel Pacing Threshold Pulse Width: 0.4 ms
Lead Channel Pacing Threshold Pulse Width: 1.5 ms
Lead Channel Sensing Intrinsic Amplitude: 12 mV
Lead Channel Sensing Intrinsic Amplitude: 3.1 mV
Lead Channel Setting Pacing Amplitude: 1.625
Lead Channel Setting Pacing Amplitude: 2.125
Lead Channel Setting Pacing Amplitude: 2.5 V
Lead Channel Setting Pacing Pulse Width: 0.4 ms
Lead Channel Setting Pacing Pulse Width: 1.5 ms
Lead Channel Setting Sensing Sensitivity: 4 mV
Pulse Gen Model: 3222
Pulse Gen Serial Number: 9132853

## 2022-01-20 ENCOUNTER — Encounter (HOSPITAL_BASED_OUTPATIENT_CLINIC_OR_DEPARTMENT_OTHER): Payer: Self-pay | Admitting: Family

## 2022-01-20 ENCOUNTER — Ambulatory Visit (HOSPITAL_BASED_OUTPATIENT_CLINIC_OR_DEPARTMENT_OTHER): Payer: Medicare HMO | Admitting: Family

## 2022-01-20 VITALS — BP 130/86 | HR 79 | Ht 65.0 in | Wt 152.0 lb

## 2022-01-20 DIAGNOSIS — I428 Other cardiomyopathies: Secondary | ICD-10-CM

## 2022-01-20 DIAGNOSIS — E782 Mixed hyperlipidemia: Secondary | ICD-10-CM

## 2022-01-20 DIAGNOSIS — I5032 Chronic diastolic (congestive) heart failure: Secondary | ICD-10-CM | POA: Diagnosis not present

## 2022-01-20 DIAGNOSIS — D6859 Other primary thrombophilia: Secondary | ICD-10-CM | POA: Diagnosis not present

## 2022-01-20 DIAGNOSIS — I480 Paroxysmal atrial fibrillation: Secondary | ICD-10-CM | POA: Diagnosis not present

## 2022-01-20 NOTE — Progress Notes (Signed)
Office Visit    Patient Name: Kristin Gomez Date of Encounter: 01/20/2022  PCP:  Bonnita Nasuti, [place]  Cardiologist:  Dorris Carnes, MD  Advanced Practice Provider:  No care team member to display Electrophysiologist:  None    Chief Complaint    Kristin Gomez is a 76 y.o. 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[place])    Stroke ([place]) 02/2017   "affected my memory" (09/26/2017)   Type II diabetes mellitus ([place])    Past Surgical History:  Procedure Laterality Date   CARDIAC CATHETERIZATION     CATARACT EXTRACTION W/ INTRAOCULAR LENS  IMPLANT, BILATERAL Bilateral    CHOLECYSTECTOMY     COLONOSCOPY     I & D EXTREMITY  03/31/2011   Procedure: IRRIGATION AND DEBRIDEMENT EXTREMITY;  Surgeon: Tennis Must, MD;  Location: [place];  Service: Orthopedics;  Laterality: Left;  left long finger   INSERT / REPLACE / REMOVE PACEMAKER     KNEE ARTHROSCOPY Left    MASS EXCISION  03/08/2011   Procedure: EXCISION MASS;  Surgeon: Tennis Must, MD;  Location: [place] [place];  Service: Orthopedics;  Laterality: Left;  Excision Mass Left Long Finger and Debridement of Distal Interphalangeal  Joint   PPM GENERATOR CHANGEOUT N/A 01/18/2019   Procedure: PPM GENERATOR CHANGEOUT;  Surgeon: Deboraha Sprang, MD;  Location: [facility];  Service: Cardiovascular;  Laterality: N/A;   TONSILLECTOMY     TOTAL ABDOMINAL HYSTERECTOMY      TUBAL LIGATION      Allergies  Allergies  Allergen Reactions   Aspirin Shortness Of Breath and Other (See Comments)    Patient states that this caused an asthma attack Patient states that is causes an asthma attack.   Azithromycin Anaphylaxis   Infliximab Hives    remicade   Methotrexate Hives   Mushroom Extract Complex Anaphylaxis, Shortness Of Breath and Swelling   Nsaids Shortness Of Breath and Other (See Comments)    Asthma attack Asthma attack   Avelox [Moxifloxacin Hcl In Nacl] Swelling    Site of swelling not recalled   Diovan [Valsartan] Swelling    Site of swelling not recalled   Donepezil Diarrhea   Donepezil Hcl Other (See Comments)    Diarrhea Diarrhea   Moxifloxacin Swelling   Other Swelling    Site of swelling not recalled   Oxycodone-Acetaminophen Nausea Only   Amoxicillin Rash   Atorvastatin Swelling and Other (See Comments)    Exacerbated Psoriatic arthritis    Contrast Media [Iodinated Contrast Media] Rash   Iodine Rash   Latex Rash   Metformin Rash    ??   Metformin And Related Rash    ??   Penicillins Rash    Has patient had a PCN reaction causing immediate rash, facial/tongue/throat swelling, SOB or lightheadedness with hypotension: Yes  Has patient had a PCN reaction causing severe rash involving mucus membranes or skin necrosis: No Has patient had a PCN reaction that required hospitalization: No Has patient had a PCN reaction occurring within the last 10 years: No If all of the above answers are "NO", then may proceed with Cephalosporin use.    Percocet [Oxycodone-Acetaminophen] Nausea Only   Prednisone Rash and Other (See Comments)    Can only be tolerated with Benadryl HAS TO TAKE BENADRYL WITH PREDNISONE   Propofol Rash   Sulfa Antibiotics Nausea Only and Rash   Vicodin [Hydrocodone-Acetaminophen] Nausea Only    History of Present Illness    Kristin Gomez is a 76 y.o. female with a hx of NICM/HFpEF, s/p CRT-P, dementia, atrial  fibrillation, RA, CVA last seen 07/15/21.  CRT-P implantation 2008 by Dr. Minna Merritts at [facility].  Generator replacement 04/06/2015 and 02/03/2019.  Echo 06/2017 EF 60 to 65%, 06/2018 45-50%, 10/2018 50-55%.  When seen 04/22/2021 by Dr. Caryl Comes noted probable LV lead dislodgment based on pacing morphologies.  Chest x-ray obtained.  However given limited functional status and dementia still determining whether lead revision would be pursued if necessary.  Seen 07/15/21 euvolemic and BP at goal. ED visit 08/20/21 atypical chest pain resolved with antacid. Admission 10/5-10/6/23 with AKI in setting of nausea, vomiting, diarrhea. She was treated with IVF. Inadvertent finding of COVID10 treated with molnupiravir. Lasix discontinued on discharge.   Presents today for follow-up with her daughter.  Her daughter assists with history taking.  Her daughter and other family members help her to maintain medications, daily weights, blood pressures.  Still not completely back to herself after COVID. Notes some feeling of fullness in her ears which PCP has flushed with not much wax, has upcoming appointment with ENT. Reports no shortness of breath at rest and only mild, stable dyspnea on exertion. Reports no chest pain, pressure, or tightness. No edema, orthopnea, PND. Reports no palpitations.    EKGs/Labs/Other Studies Reviewed:   The following studies were reviewed today:   EKG: No EKG today  Recent Labs: 11/19/2021: ALT 25; BUN 21; Creatinine, Ser 1.38; Hemoglobin 12.0; Magnesium 1.9; Platelets PLATELET CLUMPS NOTED ON SMEAR, COUNT APPEARS ADEQUATE; Potassium 3.5; Sodium 137  Recent Lipid Panel    Component Value Date/Time   CHOL 152 01/03/2020 1416   TRIG 147 01/03/2020 1416   HDL 62 01/03/2020 1416   CHOLHDL 2.5 01/03/2020 1416   LDLCALC 65 01/03/2020 1416    Risk Assessment/Calculations:    CHA2DS2-VASc Score = 8   This indicates a 10.8% annual risk of stroke. The patient's score is based upon: CHF History:  1 HTN History: 1 Diabetes History: 0 Stroke History: 2 Vascular Disease History: 0 Age Score: 2 Gender Score: 1        Home Medications   Current Meds  Medication Sig   albuterol (PROVENTIL HFA;VENTOLIN HFA) 108 (90 Base) MCG/ACT inhaler Inhale 2 puffs into the lungs every 6 (six) hours as needed for wheezing or shortness of breath.    albuterol (PROVENTIL) (2.5 MG/3ML) 0.083% nebulizer solution Take 3 mLs (2.5 mg total) by nebulization every 4 (four) hours as needed for wheezing or shortness of breath.   apixaban (ELIQUIS) 5 MG TABS tablet Take 1 tablet by mouth twice daily   arformoterol (BROVANA) 15 MCG/2ML NEBU Take 2 mLs (15 mcg total) by nebulization 2 (two) times daily.   budesonide (PULMICORT) 0.5 MG/2ML nebulizer solution Take 2 mLs (0.5 mg total) by nebulization 2 (two) times  daily.   carvedilol (COREG) 12.5 MG tablet Take 1 tablet (12.5 mg total) by mouth 2 (two) times daily with a meal.   Cholecalciferol (VITAMIN D3) 50 MCG (2000 UT) capsule Take 2,000 Units by mouth daily.    clonazePAM (KLONOPIN) 0.5 MG tablet Take 0.5 mg by mouth 2 (two) times daily as needed.   diphenhydramine-acetaminophen (TYLENOL PM) 25-500 MG TABS tablet Take 1-2 tablets by mouth at bedtime as needed (pain).   furosemide (LASIX) 20 MG tablet Take 20 mg by mouth daily. Increase as directed based on weight   glipiZIDE (GLUCOTROL XL) 5 MG 24 hr tablet Take 5 mg by mouth at bedtime.   Infant Care Products [facility]) OINT Apply 1 Application topically daily as needed (inflammation).   ipratropium-albuterol (DUONEB) 0.5-2.5 (3) MG/3ML SOLN Take 3 mLs by nebulization every 6 (six) hours as needed (for shortness of breath or wheezing).    levothyroxine (SYNTHROID) 25 MCG tablet Take 25 mcg by mouth daily before breakfast.   memantine (NAMENDA) 10 MG tablet Take 1 tablet by mouth twice daily   methylPREDNISolone (MEDROL) 4 MG tablet Take 1 mg by mouth as needed (Fibermyalgia).   omeprazole (PRILOSEC) 20  MG capsule Take 20 mg by mouth daily as needed (acid reflux).    potassium chloride SA (K-DUR,KLOR-CON) 20 MEQ tablet Take 20 mEq by mouth daily.   rosuvastatin (CRESTOR) 20 MG tablet Take 1 tablet (20 mg total) by mouth daily.     Review of Systems      All other systems reviewed and are otherwise negative except as noted above.  Physical Exam    VS:  BP 130/86   Pulse 79   Ht '5\' 5"'$  (1.651 m)   Wt 152 lb (68.9 kg)   BMI 25.29 kg/m  , BMI Body mass index is 25.29 kg/m.  Wt Readings from Last 3 Encounters:  01/20/22 152 lb (68.9 kg)  12/16/21 149 lb 6.4 oz (67.8 kg)  11/18/21 150 lb (68 kg)     GEN: Well nourished, well developed, in no acute distress. HEENT: normal. Neck: Supple, no JVD, carotid bruits, or masses. Cardiac: RRR, no murmurs, rubs, or gallops. No clubbing, cyanosis, edema.  Radials/PT 2+ and equal bilaterally.  Respiratory:  Respirations regular and unlabored, clear to auscultation bilaterally. GI: Soft, nontender, nondistended. MS: No deformity or atrophy. Skin: Warm and dry, no rash. Neuro:  Strength and sensation are intact. Psych: Normal affect.  Assessment & Plan    HFpEF /NICM- Euvolemic and well compensated on exam.  Continue furosemide 20 mg daily with additional 20 mg as needed for weight gain of 2 pounds overnight or 5 pounds in 1 week.  Additional GDMT includes carvedilol.  Lisinopril previously discontinued due to hypotension.Low sodium diet, fluid restriction <2L, and daily weights encouraged. Educated to contact our office for weight gain of 2 lbs overnight or 5 lbs in one week.   Atrial fibrillation -NSR by auscultation today.  Denies palpitations.  Continue Coreg 12.5 mg twice daily and Eliquis 5 mg twice daily.  Denies bleeding complications.  Does not meet dose reduction criteria.CHA2DS2-VASc Score = 8 [CHF History: 1, HTN History: 1, Diabetes History: 0, Stroke History: 2, Vascular Disease History: 0, Age Score: 2, Gender Score: 1].  Therefore,  the patient's annual risk of stroke is 10.8 %.   Update CBC, BMP.   HTN - BP well controlled. Continue current antihypertensive regimen.  Family monitors routinely at home.  History of CVA -continue rosuvastatin.  No aspirin due  to chronic anticoagulation.  DM2 - Continue to follow with PCP.   HLD -Continue Rosuvastatin.   S/p CRT-P -follows with Dr. Caryl Comes.  Dementia -history taking assisted by family member today.     Disposition: Follow up in 6 month(s) with Dorris Carnes, MD or APP.  Signed, Loel Dubonnet, NP 01/20/2022, 3:17 PM [place]

## 2022-01-20 NOTE — Patient Instructions (Signed)
Medication Instructions:  Your physician recommends that you continue on your current medications as directed. Please refer to the Current Medication list given to you today.  *If you need a refill on your cardiac medications before your next appointment, please call your pharmacy*   Lab Work:  BMET & CBC  Your provider has recommended lab work. Please have this collected at [facility] [place]. The lab is open 8:00 am - 4:30 pm. Please avoid 12:00p - 1:00p for lunch hour. You do not need an appointment. Please go to [address] [place], [place] [address]. This is in the Primary Care office on the 3rd floor, let them know you are there for blood work and they will direct you to the lab.  If you have labs (blood work) drawn today and your tests are completely normal, you will receive your results only by: [place] (if you have MyChart) OR A paper copy in the mail If you have any lab test that is abnormal or we need to change your treatment, we will call you to review the results.  Follow-Up: At [facility], you and your health needs are our priority.  As part of our continuing mission to provide you with exceptional heart care, we have created designated Provider Care Teams.  These Care Teams include your primary Cardiologist (physician) and Advanced Practice Providers (APPs -  Physician Assistants and Nurse Practitioners) who all work together to provide you with the care you need, when you need it.  We recommend signing up for the patient portal called "MyChart".  Sign up information is provided on this After Visit Summary.  MyChart is used to connect with patients for Virtual Visits (Telemedicine).  Patients are able to view lab/test results, encounter notes, upcoming appointments, etc.  Non-urgent messages can be sent to your provider as well.   To learn more about what you can do with MyChart, go to [email or web address].    Your next  appointment:   6 month(s)  The format for your next appointment:   In Person  Provider:   Dorris Carnes, MD  or APP   Other Instructions  Important Information About Sugar

## 2022-01-21 ENCOUNTER — Telehealth (HOSPITAL_BASED_OUTPATIENT_CLINIC_OR_DEPARTMENT_OTHER): Payer: Self-pay

## 2022-01-21 LAB — BASIC METABOLIC PANEL
BUN/Creatinine Ratio: 16 (ref 12–28)
BUN: 16 mg/dL (ref 8–27)
CO2: 21 mmol/L (ref 20–29)
Calcium: 9.2 mg/dL (ref 8.7–10.3)
Chloride: 108 mmol/L — ABNORMAL HIGH (ref 96–106)
Creatinine, Ser: 0.97 mg/dL (ref 0.57–1.00)
Glucose: 93 mg/dL (ref 70–99)
Potassium: 4.4 mmol/L (ref 3.5–5.2)
Sodium: 147 mmol/L — ABNORMAL HIGH (ref 134–144)
eGFR: 61 mL/min/{1.73_m2} (ref >59)

## 2022-01-21 LAB — CBC
Hematocrit: 46.9 % — ABNORMAL HIGH (ref 34.0–46.6)
Hemoglobin: 15.6 g/dL (ref 11.1–15.9)
MCH: 32.1 pg (ref 26.6–33.0)
MCHC: 33.3 g/dL (ref 31.5–35.7)
MCV: 97 fL (ref 79–97)
Platelets: 214 10*3/uL (ref 150–450)
RBC: 4.86 x10E6/uL (ref 3.77–5.28)
RDW: 13.1 % (ref 11.7–15.4)
WBC: 9.2 10*3/uL (ref 3.4–10.8)

## 2022-01-21 NOTE — Telephone Encounter (Addendum)
Results called to patient who verbalizes understanding!     ----- Message from Loel Dubonnet, NP sent at 01/21/2022  8:50 AM EST ----- Kidney function much improved! Sodium level mildly elevated, not of concern. CBC with no evidence of anemia nor infection.

## 2022-01-29 ENCOUNTER — Other Ambulatory Visit (HOSPITAL_BASED_OUTPATIENT_CLINIC_OR_DEPARTMENT_OTHER): Payer: Self-pay | Admitting: Family

## 2022-01-31 MED ORDER — FUROSEMIDE 20 MG PO TABS: 20.0000 mg | ORAL_TABLET | Freq: Every day | ORAL | 3 refills | Status: DC

## 2022-01-31 NOTE — Telephone Encounter (Signed)
Patient of Dr. Ross. Please review for refill. Thank you!  

## 2022-02-07 DIAGNOSIS — J449 Chronic obstructive pulmonary disease, unspecified: Secondary | ICD-10-CM | POA: Diagnosis not present

## 2022-02-07 DIAGNOSIS — I5022 Chronic systolic (congestive) heart failure: Secondary | ICD-10-CM | POA: Diagnosis not present

## 2022-02-07 DIAGNOSIS — E038 Other specified hypothyroidism: Secondary | ICD-10-CM | POA: Diagnosis not present

## 2022-02-07 DIAGNOSIS — E1165 Type 2 diabetes mellitus with hyperglycemia: Secondary | ICD-10-CM | POA: Diagnosis not present

## 2022-02-07 DIAGNOSIS — E559 Vitamin D deficiency, unspecified: Secondary | ICD-10-CM | POA: Diagnosis not present

## 2022-02-07 DIAGNOSIS — F0390 Unspecified dementia without behavioral disturbance: Secondary | ICD-10-CM | POA: Diagnosis not present

## 2022-02-07 DIAGNOSIS — I10 Essential (primary) hypertension: Secondary | ICD-10-CM | POA: Diagnosis not present

## 2022-02-07 DIAGNOSIS — D518 Other vitamin B12 deficiency anemias: Secondary | ICD-10-CM | POA: Diagnosis not present

## 2022-02-07 DIAGNOSIS — E782 Mixed hyperlipidemia: Secondary | ICD-10-CM | POA: Diagnosis not present

## 2022-02-08 DIAGNOSIS — I10 Essential (primary) hypertension: Secondary | ICD-10-CM | POA: Diagnosis not present

## 2022-02-10 DIAGNOSIS — E1165 Type 2 diabetes mellitus with hyperglycemia: Secondary | ICD-10-CM | POA: Diagnosis not present

## 2022-02-10 DIAGNOSIS — E559 Vitamin D deficiency, unspecified: Secondary | ICD-10-CM | POA: Diagnosis not present

## 2022-02-10 DIAGNOSIS — K219 Gastro-esophageal reflux disease without esophagitis: Secondary | ICD-10-CM | POA: Diagnosis not present

## 2022-02-10 DIAGNOSIS — M150 Primary generalized (osteo)arthritis: Secondary | ICD-10-CM | POA: Diagnosis not present

## 2022-02-10 DIAGNOSIS — Z23 Encounter for immunization: Secondary | ICD-10-CM | POA: Diagnosis not present

## 2022-02-10 DIAGNOSIS — D518 Other vitamin B12 deficiency anemias: Secondary | ICD-10-CM | POA: Diagnosis not present

## 2022-02-10 DIAGNOSIS — Z79899 Other long term (current) drug therapy: Secondary | ICD-10-CM | POA: Diagnosis not present

## 2022-02-10 DIAGNOSIS — J449 Chronic obstructive pulmonary disease, unspecified: Secondary | ICD-10-CM | POA: Diagnosis not present

## 2022-02-10 DIAGNOSIS — E782 Mixed hyperlipidemia: Secondary | ICD-10-CM | POA: Diagnosis not present

## 2022-02-10 DIAGNOSIS — E038 Other specified hypothyroidism: Secondary | ICD-10-CM | POA: Diagnosis not present

## 2022-02-15 NOTE — Progress Notes (Signed)
Remote pacemaker transmission.   

## 2022-02-24 ENCOUNTER — Encounter: Payer: Self-pay | Admitting: Pulmonary Disease

## 2022-02-24 ENCOUNTER — Ambulatory Visit: Payer: Medicare HMO | Admitting: Pulmonary Disease

## 2022-02-24 VITALS — BP 118/72 | HR 86 | Ht 65.0 in | Wt 158.2 lb

## 2022-02-24 DIAGNOSIS — J4540 Moderate persistent asthma, uncomplicated: Secondary | ICD-10-CM

## 2022-02-24 DIAGNOSIS — R052 Subacute cough: Secondary | ICD-10-CM | POA: Diagnosis not present

## 2022-02-24 MED ORDER — FLUTICASONE PROPIONATE 50 MCG/ACT NA SUSP: 1.0000 | Freq: Two times a day (BID) | NASAL | 2 refills | Status: AC

## 2022-02-24 NOTE — Patient Instructions (Signed)
Nice to see you again  For the nasal congestion use Flonase 1 spray each nostril twice a day for 7 days, then you can decrease to 1 spray each nostril once a day thereafter  In addition, use Afrin 2 sprays each nostril twice a day for 3 days then stop.  This is available over-the-counter.  Continue the nebulizer as needed when cough seems to worsen  Return to clinic in 6 months or sooner as needed with Dr. Silas Flood

## 2022-02-28 DIAGNOSIS — F0390 Unspecified dementia without behavioral disturbance: Secondary | ICD-10-CM | POA: Diagnosis not present

## 2022-02-28 DIAGNOSIS — D518 Other vitamin B12 deficiency anemias: Secondary | ICD-10-CM | POA: Diagnosis not present

## 2022-02-28 DIAGNOSIS — I5022 Chronic systolic (congestive) heart failure: Secondary | ICD-10-CM | POA: Diagnosis not present

## 2022-02-28 DIAGNOSIS — I10 Essential (primary) hypertension: Secondary | ICD-10-CM | POA: Diagnosis not present

## 2022-02-28 DIAGNOSIS — E038 Other specified hypothyroidism: Secondary | ICD-10-CM | POA: Diagnosis not present

## 2022-02-28 DIAGNOSIS — J449 Chronic obstructive pulmonary disease, unspecified: Secondary | ICD-10-CM | POA: Diagnosis not present

## 2022-02-28 DIAGNOSIS — E782 Mixed hyperlipidemia: Secondary | ICD-10-CM | POA: Diagnosis not present

## 2022-02-28 DIAGNOSIS — E559 Vitamin D deficiency, unspecified: Secondary | ICD-10-CM | POA: Diagnosis not present

## 2022-02-28 DIAGNOSIS — E1165 Type 2 diabetes mellitus with hyperglycemia: Secondary | ICD-10-CM | POA: Diagnosis not present

## 2022-02-28 NOTE — Progress Notes (Signed)
$'@Patient'w$  ID: Kristin Gomez, female    DOB: 10/01/1945, 77 y.o.   MRN: [id number]  Chief Complaint  Patient presents with   Follow-up    Cough came back, pt was getting better.     Referring provider: Bonnita Nasuti, MD  HPI:   77 y.o. whom are seeing in follow-up for evaluation of dyspnea exertion, wheeze, cough.    Given symptom description last time, patient was placed on budesonide arformoterol nebs for wheeze and shortness of breath, cough.  Concern for possible development of asthma.  The seem to greatly improve the quality and quality of her cough.  Much improved overall.  To the point where using nebulizers as needed, not on the routine basis.  Unfortunately, sounds expect of a viral URI over the last week or 2.  Since then cough has returned.  Not severe, mostly at night.  She notes a lot of rhinorrhea and postnasal drip new with what sounds like viral URI.  HPI at initial visit: Patient developed nausea vomiting.  Weakness.  Brought to ED.  Found to be COVID-positive.  AKI.  Admitted to the hospital.  Chest x-ray in the ED on my review and interpretation demonstrates chronic changes with what appears to be chronic left-sided pleural effusion.  CT abdomen pelvis revealed no evidence of pleural effusion on lung fields visualized on my review and interpretation.  She was hydrated.  Labs improved.  She was found to be COVID-positive.  Is felt that this was causing her GI symptoms.  She was treated with antiviral therapy.  After discharging back home she developed cough.  Wheeze.  Shortness of breath.  Slightly worsened over time.  Has been prescribed albuterol and using nebulizer with some relief.  Also has albuterol inhaler that her daughter helps administer.  She reports a history of diagnosed with asthma in the past.  Not on inhaler therapy or maintenance therapy prior to COVID infection/hospitalization.  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  Lab Results: Personally reviewed CBC    Component Value Date/Time   WBC 9.2 01/20/2022 1623   WBC 3.6 (L) 11/19/2021 0156   RBC 4.86 01/20/2022 1623   RBC 3.68 (L) 11/19/2021 0156   HGB 15.6 01/20/2022 1623   HCT 46.9 (H) 01/20/2022 1623   PLT 214 01/20/2022 1623   MCV 97 01/20/2022 1623   MCH 32.1 01/20/2022 1623   MCH 32.6 11/19/2021 0156   MCHC 33.3 01/20/2022  1623   MCHC 34.0 11/19/2021 0156   RDW 13.1 01/20/2022 1623   LYMPHSABS 1.6 11/19/2021 0156   MONOABS 0.5 11/19/2021 0156   EOSABS 0.0 11/19/2021 0156   BASOSABS 0.0 11/19/2021 0156    BMET    Component Value Date/Time   NA 147 (H) 01/20/2022 1623   K 4.4 01/20/2022 1623   CL 108 (H) 01/20/2022 1623   CO2 21 01/20/2022 1623   GLUCOSE 93 01/20/2022 1623   GLUCOSE 87 11/19/2021 0156   BUN 16 01/20/2022 1623   CREATININE 0.97 01/20/2022 1623   CALCIUM 9.2 01/20/2022 1623   GFRNONAA 40 (L) 11/19/2021 0156   GFRAA 62 01/03/2020 1416    BNP No results found for: "BNP"  ProBNP    Component Value Date/Time   PROBNP 443 (H) 05/09/2019 1538    Specialty Problems   None   Allergies  Allergen Reactions   Aspirin Shortness Of Breath and Other  (See Comments)    Patient states that this caused an asthma attack Patient states that is causes an asthma attack.   Azithromycin Anaphylaxis   Infliximab Hives    remicade   Methotrexate Hives   Mushroom Extract Complex Anaphylaxis, Shortness Of Breath and Swelling   Nsaids Shortness Of Breath and Other (See Comments)    Asthma attack Asthma attack   Avelox [Moxifloxacin Hcl In Nacl] Swelling    Site of swelling not recalled   Diovan [Valsartan] Swelling    Site of swelling not recalled   Donepezil Diarrhea   Donepezil Hcl Other (See Comments)    Diarrhea Diarrhea   Moxifloxacin Swelling   Other Swelling    Site of swelling not recalled   Oxycodone-Acetaminophen Nausea Only   Amoxicillin Rash   Atorvastatin Swelling and Other (See Comments)    Exacerbated Psoriatic arthritis    Contrast Media [Iodinated Contrast Media] Rash   Iodine Rash   Latex Rash   Metformin Rash    ??   Metformin And Related Rash    ??   Penicillins Rash    Has patient had a PCN reaction causing immediate rash, facial/tongue/throat swelling, SOB or lightheadedness with hypotension: Yes Has patient had a PCN reaction causing severe rash involving mucus membranes or skin necrosis: No Has patient had a PCN reaction that required hospitalization: No Has patient had a PCN reaction occurring within the last 10 years: No If all of the above answers are "NO", then may proceed with Cephalosporin use.    Percocet [Oxycodone-Acetaminophen] Nausea Only   Prednisone Rash and Other (See Comments)    Can only be tolerated with Benadryl HAS TO TAKE BENADRYL WITH PREDNISONE   Propofol Rash   Sulfa Antibiotics Nausea Only and Rash   Vicodin [Hydrocodone-Acetaminophen] Nausea Only     There is no immunization history on file for this patient.  Past Medical History:  Diagnosis Date   Arthritis    "all over" (09/26/2017)   Asthma    Diabetes mellitus    Fibromyalgia    Heart disease    High cholesterol     History of blood transfusion    "related to a surgery" (09/26/2017)   Hypertension    Memory difficulty 07/26/2017   Myocardial infarction 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    albuterol (PROVENTIL) (2.5 MG/3ML) 0.083% nebulizer solution Take 3 mLs (2.5 mg total) by nebulization every 4 (four) hours as needed for wheezing or shortness of breath.   apixaban (ELIQUIS) 5 MG TABS tablet Take 1 tablet by mouth twice daily   arformoterol (BROVANA) 15 MCG/2ML NEBU Take 2 mLs (15 mcg total) by nebulization 2 (two) times daily.   budesonide (PULMICORT) 0.5 MG/2ML nebulizer solution Take 2 mLs (0.5 mg total) by nebulization 2 (two) times daily.   carvedilol (COREG) 12.5 MG tablet Take 1 tablet (12.5 mg total) by mouth 2 (two) times daily with a meal.   Cholecalciferol (VITAMIN D3) 50 MCG (2000 UT) capsule Take 2,000 Units by mouth daily.    clonazePAM (KLONOPIN) 0.5 MG tablet Take 0.5 mg by mouth 2 (two) times daily as needed.   diphenhydramine-acetaminophen (TYLENOL PM) 25-500 MG TABS tablet Take 1-2 tablets by mouth at bedtime as needed (pain).   fluticasone (FLONASE) 50 MCG/ACT nasal spray Place 1 spray into both nostrils 2 (two) times daily. Then decrease to daily.   furosemide (LASIX) 20 MG tablet Take 1 tablet (20 mg total) by mouth daily. Increase as directed based on weight   glipiZIDE (GLUCOTROL XL) 5 MG 24 hr tablet Take 5 mg by mouth at bedtime.    Infant Care Products [facility]) OINT Apply 1 Application topically daily as needed (inflammation).   ipratropium-albuterol (DUONEB) 0.5-2.5 (3) MG/3ML SOLN Take 3 mLs by nebulization every 6 (six) hours as needed (for shortness of breath or wheezing).    levothyroxine (SYNTHROID) 25 MCG tablet Take 25 mcg by mouth daily before breakfast.   memantine (NAMENDA) 10 MG tablet Take 1 tablet by mouth twice daily   methylPREDNISolone (MEDROL) 4 MG tablet Take 1 mg by mouth as needed (Fibermyalgia).   omeprazole (PRILOSEC) 20 MG capsule Take 20 mg by mouth daily as needed (acid reflux).    potassium chloride SA (K-DUR,KLOR-CON) 20 MEQ tablet Take 20 mEq by mouth daily.   rosuvastatin (CRESTOR) 20 MG tablet Take 1 tablet (20 mg total) by mouth daily.   No facility-administered encounter medications on file as of 02/24/2022.     Review of Systems  Review of Systems  N/A. Physical Exam  BP 118/72   Pulse 86   Ht '5\' 5"'$  (1.651 m)   Wt 158 lb 3.2 oz (71.8 kg)   SpO2 97%   BMI 26.33 kg/m   Wt Readings from Last 5 Encounters:  02/24/22 158 lb 3.2 oz (71.8 kg)  01/20/22 152 lb (68.9 kg)  12/16/21 149 lb 6.4 oz (67.8 kg)  11/18/21 150 lb (68 kg)  08/20/21 150 lb 5.7 oz (68.2 kg)    BMI Readings from Last 5 Encounters:  02/24/22 26.33 kg/m  01/20/22 25.29 kg/m  12/16/21 24.86 kg/m  11/18/21 25.75 kg/m  08/20/21 25.81 kg/m     Physical Exam General:: Frail, sitting in chair, Eyes: EOMI, no icterus Neck: Supple, no JVP Pulmonary: Clear lungs, good air movement, normal work of breathing Cardiovascular: Warm, no edema noted Abdomen: Nondistended no masses present MSK: No synovitis, no joint effusion Neuro: slow gait, no focal deficit Psych:  Inattentive at times, flat affect  Assessment & Plan:   Cough, wheeze, dyspnea exertion: Seems new or worse since COVID infection early October 2023.  Mild wheeze on initial exam.  Mild improvement albuterol at home.  High suspicion for  post viral syndrome cough etc.  Possible development of asthma if symptoms linger in the future.  Symptoms greatly improved with nebulized budesonide and arformoterol.  Now using as needed.  Encouraged to use cough flares.  More recent cough sounds like postnasal drip based on description.  Rhinorrhea, postnasal drip: Start trial of Flonase, new prescription today.  Given cognitive dysfunction, would not tolerate sinus rinses.  If not improving can add oral medications.   Return in about 6 months (around 08/25/2022).   Lanier Clam, MD 02/28/2022

## 2022-03-03 DIAGNOSIS — H906 Mixed conductive and sensorineural hearing loss, bilateral: Secondary | ICD-10-CM | POA: Diagnosis not present

## 2022-03-03 DIAGNOSIS — H61303 Acquired stenosis of external ear canal, unspecified, bilateral: Secondary | ICD-10-CM | POA: Diagnosis not present

## 2022-03-03 DIAGNOSIS — H9193 Unspecified hearing loss, bilateral: Secondary | ICD-10-CM | POA: Diagnosis not present

## 2022-03-03 DIAGNOSIS — H6123 Impacted cerumen, bilateral: Secondary | ICD-10-CM | POA: Diagnosis not present

## 2022-03-08 DIAGNOSIS — H903 Sensorineural hearing loss, bilateral: Secondary | ICD-10-CM | POA: Diagnosis not present

## 2022-03-08 DIAGNOSIS — H9193 Unspecified hearing loss, bilateral: Secondary | ICD-10-CM | POA: Diagnosis not present

## 2022-03-08 DIAGNOSIS — H6123 Impacted cerumen, bilateral: Secondary | ICD-10-CM | POA: Diagnosis not present

## 2022-03-11 DIAGNOSIS — I10 Essential (primary) hypertension: Secondary | ICD-10-CM | POA: Diagnosis not present

## 2022-03-21 DIAGNOSIS — F419 Anxiety disorder, unspecified: Secondary | ICD-10-CM | POA: Diagnosis not present

## 2022-03-24 ENCOUNTER — Other Ambulatory Visit: Payer: Self-pay | Admitting: Neurology

## 2022-04-11 DIAGNOSIS — J449 Chronic obstructive pulmonary disease, unspecified: Secondary | ICD-10-CM | POA: Diagnosis not present

## 2022-04-11 DIAGNOSIS — E1165 Type 2 diabetes mellitus with hyperglycemia: Secondary | ICD-10-CM | POA: Diagnosis not present

## 2022-04-11 DIAGNOSIS — I10 Essential (primary) hypertension: Secondary | ICD-10-CM | POA: Diagnosis not present

## 2022-04-11 DIAGNOSIS — E782 Mixed hyperlipidemia: Secondary | ICD-10-CM | POA: Diagnosis not present

## 2022-04-11 DIAGNOSIS — D518 Other vitamin B12 deficiency anemias: Secondary | ICD-10-CM | POA: Diagnosis not present

## 2022-04-11 DIAGNOSIS — E559 Vitamin D deficiency, unspecified: Secondary | ICD-10-CM | POA: Diagnosis not present

## 2022-04-11 DIAGNOSIS — I5022 Chronic systolic (congestive) heart failure: Secondary | ICD-10-CM | POA: Diagnosis not present

## 2022-04-11 DIAGNOSIS — E038 Other specified hypothyroidism: Secondary | ICD-10-CM | POA: Diagnosis not present

## 2022-04-11 DIAGNOSIS — F0390 Unspecified dementia without behavioral disturbance: Secondary | ICD-10-CM | POA: Diagnosis not present

## 2022-04-12 DIAGNOSIS — I10 Essential (primary) hypertension: Secondary | ICD-10-CM | POA: Diagnosis not present

## 2022-04-19 ENCOUNTER — Ambulatory Visit (INDEPENDENT_AMBULATORY_CARE_PROVIDER_SITE_OTHER): Payer: Medicare HMO

## 2022-04-19 DIAGNOSIS — I428 Other cardiomyopathies: Secondary | ICD-10-CM

## 2022-04-19 LAB — CUP PACEART REMOTE DEVICE CHECK
Battery Remaining Longevity: 37 mo
Battery Remaining Percentage: 46 %
Battery Voltage: 2.96 V
Brady Statistic AP VP Percent: 27 %
Brady Statistic AP VS Percent: 1 %
Brady Statistic AS VP Percent: 73 %
Brady Statistic AS VS Percent: 1 %
Brady Statistic RA Percent Paced: 26 %
Date Time Interrogation Session: 20240305025536
Implantable Lead Connection Status: 753985
Implantable Lead Connection Status: 753985
Implantable Lead Connection Status: 753985
Implantable Lead Implant Date: 20081110
Implantable Lead Implant Date: 20081110
Implantable Lead Implant Date: 20081110
Implantable Lead Location: 753858
Implantable Lead Location: 753859
Implantable Lead Location: 753860
Implantable Pulse Generator Implant Date: 20201204
Lead Channel Impedance Value: 490 Ohm
Lead Channel Impedance Value: 540 Ohm
Lead Channel Impedance Value: 800 Ohm
Lead Channel Pacing Threshold Amplitude: 0.625 V
Lead Channel Pacing Threshold Amplitude: 1.125 V
Lead Channel Pacing Threshold Amplitude: 2 V
Lead Channel Pacing Threshold Pulse Width: 0.4 ms
Lead Channel Pacing Threshold Pulse Width: 0.4 ms
Lead Channel Pacing Threshold Pulse Width: 1.5 ms
Lead Channel Sensing Intrinsic Amplitude: 12 mV
Lead Channel Sensing Intrinsic Amplitude: 3.3 mV
Lead Channel Setting Pacing Amplitude: 1.625
Lead Channel Setting Pacing Amplitude: 2.125
Lead Channel Setting Pacing Amplitude: 2.5 V
Lead Channel Setting Pacing Pulse Width: 0.4 ms
Lead Channel Setting Pacing Pulse Width: 1.5 ms
Lead Channel Setting Sensing Sensitivity: 4 mV
Pulse Gen Model: 3222
Pulse Gen Serial Number: 9132853

## 2022-04-20 ENCOUNTER — Emergency Department (HOSPITAL_COMMUNITY): Payer: Medicare HMO

## 2022-04-20 ENCOUNTER — Encounter (HOSPITAL_COMMUNITY): Payer: Self-pay

## 2022-04-20 ENCOUNTER — Other Ambulatory Visit: Payer: Self-pay

## 2022-04-20 ENCOUNTER — Emergency Department (HOSPITAL_COMMUNITY)
Admission: EM | Admit: 2022-04-20 | Discharge: 2022-04-20 | Disposition: A | Discharge status: Home / Self Care | Payer: Medicare HMO | Attending: Emergency Medicine | Admitting: Emergency Medicine

## 2022-04-20 DIAGNOSIS — U071 COVID-19: Secondary | ICD-10-CM

## 2022-04-20 DIAGNOSIS — R4182 Altered mental status, unspecified: Secondary | ICD-10-CM | POA: Diagnosis not present

## 2022-04-20 DIAGNOSIS — I509 Heart failure, unspecified: Secondary | ICD-10-CM | POA: Insufficient documentation

## 2022-04-20 DIAGNOSIS — R059 Cough, unspecified: Secondary | ICD-10-CM | POA: Diagnosis not present

## 2022-04-20 DIAGNOSIS — R531 Weakness: Secondary | ICD-10-CM | POA: Diagnosis present

## 2022-04-20 DIAGNOSIS — I10 Essential (primary) hypertension: Secondary | ICD-10-CM | POA: Diagnosis not present

## 2022-04-20 LAB — COMPREHENSIVE METABOLIC PANEL
ALT: 22 U/L (ref 0–44)
AST: 19 U/L (ref 15–41)
Albumin: 3.2 g/dL — ABNORMAL LOW (ref 3.5–5.0)
Alkaline Phosphatase: 56 U/L (ref 38–126)
Anion gap: 8 (ref 5–15)
BUN: 11 mg/dL (ref 8–23)
CO2: 26 mmol/L (ref 22–32)
Calcium: 8.5 mg/dL — ABNORMAL LOW (ref 8.9–10.3)
Chloride: 106 mmol/L (ref 98–111)
Creatinine, Ser: 1.02 mg/dL — ABNORMAL HIGH (ref 0.44–1.00)
GFR, Estimated: 57 mL/min — ABNORMAL LOW (ref >60)
Glucose, Bld: 147 mg/dL — ABNORMAL HIGH (ref 70–99)
Potassium: 3.8 mmol/L (ref 3.5–5.1)
Sodium: 140 mmol/L (ref 135–145)
Total Bilirubin: 1.1 mg/dL (ref 0.3–1.2)
Total Protein: 6 g/dL — ABNORMAL LOW (ref 6.5–8.1)

## 2022-04-20 LAB — CBC WITH DIFFERENTIAL/PLATELET
Abs Immature Granulocytes: 0.04 10*3/uL (ref 0.00–0.07)
Basophils Absolute: 0.1 10*3/uL (ref 0.0–0.1)
Basophils Relative: 1 %
Eosinophils Absolute: 0.1 10*3/uL (ref 0.0–0.5)
Eosinophils Relative: 1 %
HCT: 45.7 % (ref 36.0–46.0)
Hemoglobin: 15.5 g/dL — ABNORMAL HIGH (ref 12.0–15.0)
Immature Granulocytes: 1 %
Lymphocytes Relative: 23 %
Lymphs Abs: 2 10*3/uL (ref 0.7–4.0)
MCH: 32.6 pg (ref 26.0–34.0)
MCHC: 33.9 g/dL (ref 30.0–36.0)
MCV: 96.2 fL (ref 80.0–100.0)
Monocytes Absolute: 0.7 10*3/uL (ref 0.1–1.0)
Monocytes Relative: 8 %
Neutro Abs: 5.6 10*3/uL (ref 1.7–7.7)
Neutrophils Relative %: 66 %
Platelets: 176 10*3/uL (ref 150–400)
RBC: 4.75 MIL/uL (ref 3.87–5.11)
RDW: 13.1 % (ref 11.5–15.5)
WBC: 8.4 10*3/uL (ref 4.0–10.5)
nRBC: 0 % (ref 0.0–0.2)

## 2022-04-20 LAB — LIPASE, BLOOD: Lipase: 34 U/L (ref 11–51)

## 2022-04-20 LAB — RESP PANEL BY RT-PCR (RSV, FLU A&B, COVID)  RVPGX2
Influenza A by PCR: NEGATIVE
Influenza B by PCR: NEGATIVE
Resp Syncytial Virus by PCR: NEGATIVE
SARS Coronavirus 2 by RT PCR: POSITIVE — AB

## 2022-04-20 LAB — CBG MONITORING, ED: Glucose-Capillary: 154 mg/dL — ABNORMAL HIGH (ref 70–99)

## 2022-04-20 MED ORDER — MOLNUPIRAVIR EUA 200MG CAPSULE: 4.0000 | ORAL_CAPSULE | Freq: Two times a day (BID) | ORAL | 0 refills | Status: DC

## 2022-04-20 MED ORDER — MOLNUPIRAVIR EUA 200MG CAPSULE: 4.0000 | ORAL_CAPSULE | Freq: Two times a day (BID) | ORAL | 0 refills | Status: AC

## 2022-04-20 NOTE — ED Triage Notes (Signed)
Pt came in via POV d/t n/v for the past few days, very lethargic, at times hard to rouse. Her CBG has been low at home, Hx of Sepsis & has raw redness in peri area, denies recent fevers. DM meds have been changed recently. Vomiting while in triage & stating she is dizzy.

## 2022-04-20 NOTE — ED Provider Triage Note (Signed)
Emergency Medicine Provider Triage Evaluation Note  Kristin Gomez , a 77 y.o. female  was evaluated in triage.  Pt complains of nausea, vomiting, confusion.  Daughter reportedly checked on patient on Sunday and found her to be in a hypoglycemic,.  EMS was called and reportedly treated her without transporting.  Her blood sugars have been irregular since that time.  Was stopped her glipizide by her primary care provider.  Today was confused and nauseous.  Currently complains of "feeling sick."  States this numerous times.  Will not answer other ROS questions.  Alert, oriented to self and daughter  Review of Systems  Positive: As above Negative: As above  Physical Exam  BP (!) 127/102 (BP Location: Left Arm)   Pulse 80   Temp 97.6 F (36.4 C) (Oral)   Resp 19   Ht '5\' 5"'$  (1.651 m)   Wt 68 kg   SpO2 99%   BMI 24.96 kg/m  Gen:   Awake, no distress   Resp:  Normal effort  MSK:   Moves extremities without difficulty  Other:  Actively vomiting in triage  Medical Decision Making  Medically screening exam initiated at 4:02 PM.  Appropriate orders placed.  Zelphia Cairo was informed that the remainder of the evaluation will be completed by another provider, this initial triage assessment does not replace that evaluation, and the importance of remaining in the ED until their evaluation is complete.     Roylene Reason, [place] 04/20/22 [phone]

## 2022-04-20 NOTE — ED Notes (Signed)
Patient transported to CT 

## 2022-04-20 NOTE — ED Provider Notes (Signed)
[facility] Provider Note   CSN: [id number] Arrival date & time: 04/20/22  1459     History Chief Complaint  Patient presents with   Nausea   Emesis   Fatigue   Hypoglycemia    HPI Kristin Gomez is a 77 y.o. female presenting for chief complaint of weakness, altered mental status.. Per patient's daughter, patient has been sick since Saturday she has been confused not getting out of bed.  Her baseline is being functional able to perform her own ADLs.  She does live with her family due to history of CVA x 3 as well as multiple medical problems. Patient's daughter noticed that the patient was hard to arouse on Sunday thought to be secondary to nonspecific weakness.  Patient was found to have a blood sugar in the low 60s and was stopped on her glipizide.  She has not been waking up to eat and requires frequent aggressive awakening to convince her to stay awake. Today seems to be slowly improving but given duration of symptoms, family brings patient in for further care and management.  She had COVID back in October of last year and required oxygen during that and was discharged with a home oxygen concentrator able to turn up to 4 L as needed.  Patient has had a cough for the last 5 days after exposure to a sick nephew last week.  Patient has not vaccinated for coronavirus.  Patient's recorded medical, surgical, social, medication list and allergies were reviewed in the Snapshot window as part of the initial history.   Review of Systems   Review of Systems  Constitutional:  Positive for chills and fatigue. Negative for fever.  HENT:  Negative for ear pain and sore throat.   Eyes:  Negative for pain and visual disturbance.  Respiratory:  Positive for cough. Negative for shortness of breath.   Cardiovascular:  Negative for chest pain and palpitations.  Gastrointestinal:  Negative for abdominal pain and vomiting.  Genitourinary:  Negative for  dysuria and hematuria.  Musculoskeletal:  Negative for arthralgias and back pain.  Skin:  Negative for color change and rash.  Neurological:  Negative for seizures and syncope.  All other systems reviewed and are negative.   Physical Exam Updated Vital Signs BP (!) 127/102 (BP Location: Left Arm)   Pulse 80   Temp (!) 97.3 F (36.3 C) (Oral)   Resp 19   Ht '5\' 5"'$  (1.651 m)   Wt 68 kg   SpO2 99%   BMI 24.96 kg/m  Physical Exam Vitals and nursing note reviewed.  Constitutional:      General: She is not in acute distress.    Appearance: She is well-developed.  HENT:     Head: Normocephalic and atraumatic.  Eyes:     Conjunctiva/sclera: Conjunctivae normal.  Cardiovascular:     Rate and Rhythm: Normal rate and regular rhythm.     Heart sounds: No murmur heard. Pulmonary:     Effort: Pulmonary effort is normal. No respiratory distress.     Breath sounds: Normal breath sounds.  Abdominal:     General: There is no distension.     Palpations: Abdomen is soft.     Tenderness: There is no abdominal tenderness. There is no right CVA tenderness or left CVA tenderness.  Musculoskeletal:        General: No swelling or tenderness. Normal range of motion.     Cervical back: Neck supple.  Skin:  General: Skin is warm and dry.  Neurological:     General: No focal deficit present.     Mental Status: She is alert and oriented to person, place, and time. Mental status is at baseline.     Cranial Nerves: No cranial nerve deficit.      ED Course/ Medical Decision Making/ A&P Clinical Course as of 04/20/22 1857  Wed Apr 20, 2022  [place] Sick since Saturday. Confused, not getting out of bed.  Baseline is living with daughter. This week not able to do any ADLS. Hx CVA x3, DM on glipizide  Sugars have been low, glipizide stopped on Monday.  CHF with 2LNC at home.  [CC]    Clinical Course User Index [CC] Tretha Sciara, MD    Procedures Procedures    Medications Ordered in ED Medications - No data to display  Medical Decision Making:    Kristin Gomez is a 77 y.o. female who presented to the ED today with weakness, fever, cough detailed above.     Patient's presentation is complicated by their history of multiple comorbid medical problems including advanced age.  Patient placed on continuous vitals and telemetry monitoring while in ED which was reviewed periodically.   Complete initial physical exam performed, notably the patient  was hemodynamically stable in no acute distress.      Reviewed and confirmed nursing documentation for past medical history, family history, social history.    Initial Assessment:   With the patient's presentation of altered mental status, most likely diagnosis is delirium secondary to underlying infection such as UTI, COVID, pneumonia. Other diagnoses were considered including (but not limited to) intracranial mass or hemorrhage, metabolic disturbance, critical anemia. These are considered less likely due to history of present illness and physical exam findings.   This is most consistent with an acute life/limb threatening illness complicated by underlying chronic conditions.  Initial Plan:  CT head to evaluate for structural intracranial etiology of patient's altered mental status Viral screening to evaluate for infectious pathology Screening labs including CBC and Metabolic panel to evaluate for infectious or metabolic etiology of disease.  Urinalysis with reflex culture ordered to evaluate for UTI or relevant urologic/nephrologic pathology.  Lipase to evaluate for pancreatitis given reported nausea CXR to evaluate for structural/infectious intrathoracic pathology.  EKG to evaluate for cardiac pathology. Objective evaluation as below reviewed with plan for close reassessment  Initial Study Results:   Laboratory  All laboratory results reviewed without evidence of clinically relevant pathology.    Exceptions include: Positive COVID test  EKG EKG was reviewed independently. Rate, rhythm, axis, intervals all examined and without medically relevant abnormality. ST segments without concerns for elevations.    Radiology  All images reviewed independently. Agree with radiology report at this time.   CT Head Wo Contrast  Result Date: 04/20/2022 CLINICAL DATA:  Mental status change, unknown cause EXAM: CT HEAD WITHOUT CONTRAST TECHNIQUE: Contiguous axial images were obtained from the base of the skull through the vertex without intravenous contrast. RADIATION DOSE REDUCTION: This exam was performed according to the departmental dose-optimization program which includes automated exposure control, adjustment of the mA and/or kV according to patient size and/or use of iterative reconstruction technique. COMPARISON:  08/20/2021 FINDINGS: Brain: Mild parenchymal volume loss is stable, commensurate with the patient's age. Remote infarcts within the a right frontal periventricular white matter, right occipital lobe, and left cerebellum are unchanged. No acute intracranial hemorrhage or infarct. No abnormal mass effect or midline  shift. No abnormal intra or extra-axial mass lesion or fluid collection. Ventricular size is normal. Cerebellum is otherwise unremarkable. Vascular: Stable hyperdensity of the proximal left MCA secondary to intracranial atherosclerotic disease, better seen on CTA 09/07/2018. No new asymmetric hyperdensity involving the intracranial vasculature at the skull base. Skull: Normal. Negative for fracture or focal lesion. Sinuses/Orbits: No acute finding. Other: Mastoid air cells and middle ear cavities are clear. IMPRESSION: 1. No acute intracranial hemorrhage or infarct. 2. Stable remote infarcts as described above. 3. Stable hyperdensity of the proximal left MCA secondary to intracranial atherosclerotic disease, better seen on CTA 09/07/2018. Electronically Signed   By: Fidela Salisbury M.D.    On: 04/20/2022 17:38   DG Chest Port 1 View  Result Date: 04/20/2022 CLINICAL DATA:  Cough. EXAM: PORTABLE CHEST 1 VIEW COMPARISON:  November 18, 2021. FINDINGS: Stable cardiomegaly. Left-sided pacemaker is unchanged in position. Right lung is clear. Left basilar atelectasis or infiltrate cannot be excluded. Possible left pleural effusion. Bony thorax is unremarkable. IMPRESSION: Possible left basilar opacity as described above. Electronically Signed   By: Marijo Conception M.D.   On: 04/20/2022 17:03     Final Assessment and Plan:   Reevaluated at bedside after 4 hours in emergency department.  Patient is COVID-positive with no other acute pathology detected today.  Patient has not been able to provide a urine as she is incontinent.  Offered holding patient for urinary testing, however given positive COVID test, reported symptoms of cough and no dysuria, family feels comfortable with continued outpatient support for the patient.  She has home oxygen at the house from her prior COVID infection that she can continue using. They understand strict return precautions.  I offered hospital observation given her delirium and altered mental status, however family states that they are used to managing her symptoms at home and feel more comfortable taking care of the patient rather than having her admitted.  Given their understanding the risks and benefits of outpatient care management, patient stable for discharge at this time.   Disposition:  I have considered need for hospitalization, however, considering all of the above, I believe this patient is stable for discharge at this time.  Patient/family educated about specific return precautions for given chief complaint and symptoms.  Patient/family educated about follow-up with PCP .     Patient/family expressed understanding of return precautions and need for follow-up. Patient spoken to regarding all imaging and laboratory results and appropriate follow up for  these results. All education provided in verbal form with additional information in written form. Time was allowed for answering of patient questions. Patient discharged.    Emergency Department Medication Summary:   Medications - No data to display       Clinical Impression:  1. COVID      Discharge   Final Clinical Impression(s) / ED Diagnoses Final diagnoses:  COVID    Rx / DC Orders ED Discharge Orders          Ordered    molnupiravir EUA (LAGEVRIO) 200 mg CAPS capsule  2 times daily,   Status:  Discontinued        04/20/22 1856    molnupiravir EUA (LAGEVRIO) 200 mg CAPS capsule  2 times daily        04/20/22 1857              Tretha Sciara, MD 04/20/22 1857

## 2022-04-28 ENCOUNTER — Other Ambulatory Visit: Payer: Self-pay

## 2022-04-28 MED ORDER — CARVEDILOL 12.5 MG PO TABS: 12.5000 mg | ORAL_TABLET | Freq: Two times a day (BID) | ORAL | 0 refills | Status: DC

## 2022-04-29 ENCOUNTER — Other Ambulatory Visit: Payer: Self-pay | Admitting: Internal Medicine

## 2022-05-06 ENCOUNTER — Other Ambulatory Visit: Payer: Self-pay | Admitting: Internal Medicine

## 2022-05-06 DIAGNOSIS — I480 Paroxysmal atrial fibrillation: Secondary | ICD-10-CM

## 2022-05-06 NOTE — Telephone Encounter (Signed)
Prescription refill request for Eliquis received.  Indication: afib  Last office visit: 01/20/2022, [place]  Scr: 1.02, 04/20/2022 Age: 77 yo  Weight: 68 kg   Refill sent.

## 2022-05-09 DIAGNOSIS — F0390 Unspecified dementia without behavioral disturbance: Secondary | ICD-10-CM | POA: Diagnosis not present

## 2022-05-09 DIAGNOSIS — M797 Fibromyalgia: Secondary | ICD-10-CM | POA: Diagnosis not present

## 2022-05-09 DIAGNOSIS — I10 Essential (primary) hypertension: Secondary | ICD-10-CM | POA: Diagnosis not present

## 2022-05-09 DIAGNOSIS — E782 Mixed hyperlipidemia: Secondary | ICD-10-CM | POA: Diagnosis not present

## 2022-05-09 DIAGNOSIS — D518 Other vitamin B12 deficiency anemias: Secondary | ICD-10-CM | POA: Diagnosis not present

## 2022-05-09 DIAGNOSIS — J449 Chronic obstructive pulmonary disease, unspecified: Secondary | ICD-10-CM | POA: Diagnosis not present

## 2022-05-09 DIAGNOSIS — I5022 Chronic systolic (congestive) heart failure: Secondary | ICD-10-CM | POA: Diagnosis not present

## 2022-05-09 DIAGNOSIS — E1165 Type 2 diabetes mellitus with hyperglycemia: Secondary | ICD-10-CM | POA: Diagnosis not present

## 2022-05-09 DIAGNOSIS — E559 Vitamin D deficiency, unspecified: Secondary | ICD-10-CM | POA: Diagnosis not present

## 2022-05-11 DIAGNOSIS — K219 Gastro-esophageal reflux disease without esophagitis: Secondary | ICD-10-CM | POA: Diagnosis not present

## 2022-05-11 DIAGNOSIS — Z79899 Other long term (current) drug therapy: Secondary | ICD-10-CM | POA: Diagnosis not present

## 2022-05-11 DIAGNOSIS — E559 Vitamin D deficiency, unspecified: Secondary | ICD-10-CM | POA: Diagnosis not present

## 2022-05-11 DIAGNOSIS — Z0000 Encounter for general adult medical examination without abnormal findings: Secondary | ICD-10-CM | POA: Diagnosis not present

## 2022-05-11 DIAGNOSIS — I10 Essential (primary) hypertension: Secondary | ICD-10-CM | POA: Diagnosis not present

## 2022-05-11 DIAGNOSIS — E1165 Type 2 diabetes mellitus with hyperglycemia: Secondary | ICD-10-CM | POA: Diagnosis not present

## 2022-05-11 DIAGNOSIS — D518 Other vitamin B12 deficiency anemias: Secondary | ICD-10-CM | POA: Diagnosis not present

## 2022-05-11 DIAGNOSIS — E782 Mixed hyperlipidemia: Secondary | ICD-10-CM | POA: Diagnosis not present

## 2022-05-11 DIAGNOSIS — E038 Other specified hypothyroidism: Secondary | ICD-10-CM | POA: Diagnosis not present

## 2022-05-11 DIAGNOSIS — I5022 Chronic systolic (congestive) heart failure: Secondary | ICD-10-CM | POA: Diagnosis not present

## 2022-05-12 DIAGNOSIS — R011 Cardiac murmur, unspecified: Secondary | ICD-10-CM | POA: Diagnosis not present

## 2022-05-12 DIAGNOSIS — E1165 Type 2 diabetes mellitus with hyperglycemia: Secondary | ICD-10-CM | POA: Diagnosis not present

## 2022-05-13 DIAGNOSIS — I10 Essential (primary) hypertension: Secondary | ICD-10-CM | POA: Diagnosis not present

## 2022-05-16 DIAGNOSIS — E782 Mixed hyperlipidemia: Secondary | ICD-10-CM | POA: Diagnosis not present

## 2022-05-16 DIAGNOSIS — N1831 Chronic kidney disease, stage 3a: Secondary | ICD-10-CM | POA: Diagnosis not present

## 2022-05-16 DIAGNOSIS — E1165 Type 2 diabetes mellitus with hyperglycemia: Secondary | ICD-10-CM | POA: Diagnosis not present

## 2022-05-27 DIAGNOSIS — F0390 Unspecified dementia without behavioral disturbance: Secondary | ICD-10-CM | POA: Diagnosis not present

## 2022-05-27 DIAGNOSIS — D518 Other vitamin B12 deficiency anemias: Secondary | ICD-10-CM | POA: Diagnosis not present

## 2022-05-27 DIAGNOSIS — J449 Chronic obstructive pulmonary disease, unspecified: Secondary | ICD-10-CM | POA: Diagnosis not present

## 2022-05-27 DIAGNOSIS — E1165 Type 2 diabetes mellitus with hyperglycemia: Secondary | ICD-10-CM | POA: Diagnosis not present

## 2022-05-27 DIAGNOSIS — I10 Essential (primary) hypertension: Secondary | ICD-10-CM | POA: Diagnosis not present

## 2022-05-27 DIAGNOSIS — M797 Fibromyalgia: Secondary | ICD-10-CM | POA: Diagnosis not present

## 2022-05-27 DIAGNOSIS — E559 Vitamin D deficiency, unspecified: Secondary | ICD-10-CM | POA: Diagnosis not present

## 2022-05-27 DIAGNOSIS — E782 Mixed hyperlipidemia: Secondary | ICD-10-CM | POA: Diagnosis not present

## 2022-05-27 DIAGNOSIS — I5022 Chronic systolic (congestive) heart failure: Secondary | ICD-10-CM | POA: Diagnosis not present

## 2022-05-27 NOTE — Progress Notes (Signed)
Remote pacemaker transmission.   

## 2022-06-14 DIAGNOSIS — Z7984 Long term (current) use of oral hypoglycemic drugs: Secondary | ICD-10-CM | POA: Diagnosis not present

## 2022-06-14 DIAGNOSIS — H26491 Other secondary cataract, right eye: Secondary | ICD-10-CM | POA: Diagnosis not present

## 2022-06-14 DIAGNOSIS — H16223 Keratoconjunctivitis sicca, not specified as Sjogren's, bilateral: Secondary | ICD-10-CM | POA: Diagnosis not present

## 2022-06-14 DIAGNOSIS — E119 Type 2 diabetes mellitus without complications: Secondary | ICD-10-CM | POA: Diagnosis not present

## 2022-06-14 DIAGNOSIS — H35013 Changes in retinal vascular appearance, bilateral: Secondary | ICD-10-CM | POA: Diagnosis not present

## 2022-06-14 DIAGNOSIS — H43393 Other vitreous opacities, bilateral: Secondary | ICD-10-CM | POA: Diagnosis not present

## 2022-06-14 DIAGNOSIS — H524 Presbyopia: Secondary | ICD-10-CM | POA: Diagnosis not present

## 2022-06-27 DIAGNOSIS — B070 Plantar wart: Secondary | ICD-10-CM | POA: Diagnosis not present

## 2022-07-01 DIAGNOSIS — D518 Other vitamin B12 deficiency anemias: Secondary | ICD-10-CM | POA: Diagnosis not present

## 2022-07-01 DIAGNOSIS — I10 Essential (primary) hypertension: Secondary | ICD-10-CM | POA: Diagnosis not present

## 2022-07-01 DIAGNOSIS — E559 Vitamin D deficiency, unspecified: Secondary | ICD-10-CM | POA: Diagnosis not present

## 2022-07-01 DIAGNOSIS — I5022 Chronic systolic (congestive) heart failure: Secondary | ICD-10-CM | POA: Diagnosis not present

## 2022-07-01 DIAGNOSIS — F0390 Unspecified dementia without behavioral disturbance: Secondary | ICD-10-CM | POA: Diagnosis not present

## 2022-07-01 DIAGNOSIS — M797 Fibromyalgia: Secondary | ICD-10-CM | POA: Diagnosis not present

## 2022-07-01 DIAGNOSIS — E1165 Type 2 diabetes mellitus with hyperglycemia: Secondary | ICD-10-CM | POA: Diagnosis not present

## 2022-07-01 DIAGNOSIS — E782 Mixed hyperlipidemia: Secondary | ICD-10-CM | POA: Diagnosis not present

## 2022-07-01 DIAGNOSIS — J449 Chronic obstructive pulmonary disease, unspecified: Secondary | ICD-10-CM | POA: Diagnosis not present

## 2022-07-04 DIAGNOSIS — F419 Anxiety disorder, unspecified: Secondary | ICD-10-CM | POA: Diagnosis not present

## 2022-07-07 DIAGNOSIS — F419 Anxiety disorder, unspecified: Secondary | ICD-10-CM | POA: Diagnosis not present

## 2022-07-13 DIAGNOSIS — I10 Essential (primary) hypertension: Secondary | ICD-10-CM | POA: Diagnosis not present

## 2022-07-14 DIAGNOSIS — Z1211 Encounter for screening for malignant neoplasm of colon: Secondary | ICD-10-CM | POA: Diagnosis not present

## 2022-07-19 ENCOUNTER — Ambulatory Visit (INDEPENDENT_AMBULATORY_CARE_PROVIDER_SITE_OTHER): Payer: Medicare HMO

## 2022-07-19 DIAGNOSIS — I428 Other cardiomyopathies: Secondary | ICD-10-CM | POA: Diagnosis not present

## 2022-07-19 LAB — CUP PACEART REMOTE DEVICE CHECK
Battery Remaining Longevity: 35 mo
Battery Remaining Percentage: 42 %
Battery Voltage: 2.96 V
Brady Statistic AP VP Percent: 24 %
Brady Statistic AP VS Percent: 1 %
Brady Statistic AS VP Percent: 75 %
Brady Statistic AS VS Percent: 1 %
Brady Statistic RA Percent Paced: 23 %
Date Time Interrogation Session: 20240604043459
Implantable Lead Connection Status: 753985
Implantable Lead Connection Status: 753985
Implantable Lead Connection Status: 753985
Implantable Lead Implant Date: 20081110
Implantable Lead Implant Date: 20081110
Implantable Lead Implant Date: 20081110
Implantable Lead Location: 753858
Implantable Lead Location: 753859
Implantable Lead Location: 753860
Implantable Pulse Generator Implant Date: 20201204
Lead Channel Impedance Value: 480 Ohm
Lead Channel Impedance Value: 510 Ohm
Lead Channel Impedance Value: 930 Ohm
Lead Channel Pacing Threshold Amplitude: 0.75 V
Lead Channel Pacing Threshold Amplitude: 1.25 V
Lead Channel Pacing Threshold Amplitude: 2 V
Lead Channel Pacing Threshold Pulse Width: 0.4 ms
Lead Channel Pacing Threshold Pulse Width: 0.4 ms
Lead Channel Pacing Threshold Pulse Width: 1.5 ms
Lead Channel Sensing Intrinsic Amplitude: 12 mV
Lead Channel Sensing Intrinsic Amplitude: 2.6 mV
Lead Channel Setting Pacing Amplitude: 1.75 V
Lead Channel Setting Pacing Amplitude: 2.25 V
Lead Channel Setting Pacing Amplitude: 2.5 V
Lead Channel Setting Pacing Pulse Width: 0.4 ms
Lead Channel Setting Pacing Pulse Width: 1.5 ms
Lead Channel Setting Sensing Sensitivity: 4 mV
Pulse Gen Model: 3222
Pulse Gen Serial Number: 9132853

## 2022-07-20 LAB — COLOGUARD: COLOGUARD: POSITIVE — AB

## 2022-08-11 ENCOUNTER — Ambulatory Visit: Payer: Medicare HMO | Attending: Internal Medicine | Admitting: Internal Medicine

## 2022-08-11 ENCOUNTER — Encounter: Payer: Self-pay | Admitting: Internal Medicine

## 2022-08-11 VITALS — BP 122/74 | HR 85 | Ht 65.0 in | Wt 158.6 lb

## 2022-08-11 DIAGNOSIS — I428 Other cardiomyopathies: Secondary | ICD-10-CM | POA: Diagnosis not present

## 2022-08-11 NOTE — Progress Notes (Signed)
Cardiology Office Note   Date:  08/11/2022   ID:  Kristin Gomez, Kristin Gomez September 11, 1945, MRN [id number]  PCP:  Galvin Proffer, MD  Cardiologist:   Dietrich Pates, MD   F/U of HTN and chronic systolic CHF     History of Present Illness: Kristin Gomez is a 77 y.o. female with a history of HTN, Type II DM, RA, dementia, HL, CHF and CRT-P (2008; generator replacemnt in 2017 and 2020).   Echo in October 2019 LVEF was 35 to 40% then in May 2019 60 to 65%.  Last echo in Sept 2020 50 to 55% I last saw the pt in clinic in 2021  Since then she hsa been seen by APPs, last in Dec 2023   The pt is with her family member today    Denies CP   No dizzienss Does give out with walking   Took break coming in from parking lot   Denies palpitations   No PND    Current Meds  Medication Sig   albuterol (PROVENTIL HFA;VENTOLIN HFA) 108 (90 Base) MCG/ACT inhaler Inhale 2 puffs into the lungs every 6 (six) hours as needed for wheezing or shortness of breath.    albuterol (PROVENTIL) (2.5 MG/3ML) 0.083% nebulizer solution Take 3 mLs (2.5 mg total) by nebulization every 4 (four) hours as needed for wheezing or shortness of breath.   apixaban (ELIQUIS) 5 MG TABS tablet Take 1 tablet by mouth twice daily   arformoterol (BROVANA) 15 MCG/2ML NEBU Take 2 mLs (15 mcg total) by nebulization 2 (two) times daily.   budesonide (PULMICORT) 0.5 MG/2ML nebulizer solution Take 2 mLs (0.5 mg total) by nebulization 2 (two) times daily.   carvedilol (COREG) 12.5 MG tablet TAKE 1 TABLET BY MOUTH TWICE DAILY WITH A MEAL   Cholecalciferol (VITAMIN D3) 50 MCG (2000 UT) capsule Take 2,000 Units by mouth daily.    clonazePAM (KLONOPIN) 0.5 MG tablet Take 0.5 mg by mouth 2 (two) times daily as needed.   diphenhydramine-acetaminophen (TYLENOL PM) 25-500 MG TABS tablet Take 1-2 tablets by mouth at bedtime as needed (pain).   fluticasone (FLONASE) 50 MCG/ACT nasal spray Place 1 spray into both nostrils 2 (two) times daily. Then decrease to daily.    furosemide (LASIX) 20 MG tablet Take 1 tablet (20 mg total) by mouth daily. Increase as directed based on weight   Infant Care Products (DERMACLOUD) OINT Apply 1 Application topically daily as needed (inflammation).   ipratropium-albuterol (DUONEB) 0.5-2.5 (3) MG/3ML SOLN Take 3 mLs by nebulization every 6 (six) hours as needed (for shortness of breath or wheezing).    levothyroxine (SYNTHROID) 25 MCG tablet Take 25 mcg by mouth daily before breakfast.   memantine (NAMENDA) 10 MG tablet Take 1 tablet by mouth twice daily   methylPREDNISolone (MEDROL) 4 MG tablet Take 1 mg by mouth as needed (Fibermyalgia).   omeprazole (PRILOSEC) 20 MG capsule Take 20 mg by mouth daily as needed (acid reflux).    potassium chloride SA (K-DUR,KLOR-CON) 20 MEQ tablet Take 20 mEq by mouth daily.   rosuvastatin (CRESTOR) 20 MG tablet Take 1 tablet (20 mg total) by mouth daily.   RYBELSUS 3 MG TABS Take 1 tablet by mouth daily at 6 (six) AM.     Allergies:   Aspirin, Azithromycin, Infliximab, Methotrexate, Mushroom extract complex, Nsaids, Avelox [moxifloxacin hcl in nacl], Diovan [valsartan], Donepezil, Donepezil hcl, Moxifloxacin, Other, Oxycodone-acetaminophen, Amoxicillin, Atorvastatin, Contrast media [iodinated contrast media], Iodine, Latex, Metformin, Metformin and related, Penicillins, Percocet [  oxycodone-acetaminophen], Prednisone, Propofol, Sulfa antibiotics, and Vicodin [hydrocodone-acetaminophen]   Past Medical History:  Diagnosis Date   Arthritis    "all over" (09/26/2017)   Asthma    Diabetes mellitus    Fibromyalgia    Heart disease    High cholesterol    History of blood transfusion    "related to a surgery" (09/26/2017)   Hypertension    Memory difficulty 07/26/2017   Myocardial infarction [facility])    "years ago" (09/26/2017)   Pacemaker    Psoriatic arthritis, destructive type (HCC)    Stroke (HCC) 02/2017   "affected my memory" (09/26/2017)   Type II diabetes mellitus (HCC)     Past  Surgical History:  Procedure Laterality Date   CARDIAC CATHETERIZATION     CATARACT EXTRACTION W/ INTRAOCULAR LENS  IMPLANT, BILATERAL Bilateral    CHOLECYSTECTOMY     COLONOSCOPY     I & D EXTREMITY  03/31/2011   Procedure: IRRIGATION AND DEBRIDEMENT EXTREMITY;  Surgeon: Tami Ribas, MD;  Location: [facility];  Service: Orthopedics;  Laterality: Left;  left long finger   INSERT / REPLACE / REMOVE PACEMAKER     KNEE ARTHROSCOPY Left    MASS EXCISION  03/08/2011   Procedure: EXCISION MASS;  Surgeon: Tami Ribas, MD;  Location: [facility];  Service: Orthopedics;  Laterality: Left;  Excision Mass Left Long Finger and Debridement of Distal Interphalangeal  Joint   PPM GENERATOR CHANGEOUT N/A 01/18/2019   Procedure: PPM GENERATOR CHANGEOUT;  Surgeon: Duke Salvia, MD;  Location: [facility] IN[facility];  Service: Cardiovascular;  Laterality: N/A;   TONSILLECTOMY     TOTAL ABDOMINAL HYSTERECTOMY     TUBAL LIGATION       Social History:  The patient  reports that she has never smoked. She has never used smokeless tobacco. She reports that she does not drink alcohol and does not use drugs.   Family History:  The patient's family history includes Alzheimer's disease in her mother; Diabetes in her brother; Heart attack in her father.    ROS:  Please see the history of present illness. All other systems are reviewed and  Negative to the above problem except as noted.    PHYSICAL EXAM: VS:  BP 122/74   Pulse 85   Ht 5\' 5"  (1.651 m)   Wt 158 lb 9.6 oz (71.9 kg)   SpO2 96%   BMI 26.39 kg/m   GEN: 77 yo  in no acute distress  HEENT: normal  Neck: JVP is normal Cardiac: Regular rate and rhythm no S3  No  LE  edema  Respiratory:  clear to auscultation bilaterally, normal work of breathing GI: soft nontender BS  No hepatomegaly  MS: no deformity Moving all extremities   Skin: Bruises.  Skin is dry.    EKG EKG is ordered today .  Possible sinus rhythm P waves  difficult to see in some leads.  Ventricular paced   Echo   2021    1. Left ventricular ejection fraction, by estimation, is 40 to 45%. The  left ventricle has mildly decreased function. The left ventricle  demonstrates global hypokinesis. There is moderate asymmetric left  ventricular hypertrophy of the septal segment.   2. Right ventricular systolic function is normal. The right ventricular  size is normal.   3. The mitral valve is abnormal. Moderate mitral annular calcification.  No evidence of mitral valve regurgitation.   4. The aortic valve was not well  visualized. Aortic valve regurgitation  is not visualized.   Lipid Panel    Component Value Date/Time   CHOL 152 01/03/2020 1416   TRIG 147 01/03/2020 1416   HDL 62 01/03/2020 1416   CHOLHDL 2.5 01/03/2020 1416   LDLCALC 65 01/03/2020 1416      Wt Readings from Last 3 Encounters:  08/11/22 158 lb 9.6 oz (71.9 kg)  04/20/22 150 lb (68 kg)  02/24/22 158 lb 3.2 oz (71.8 kg)      ASSESSMENT AND PLAN:  1  Chronic diastolic/ systolic   Pt s/p CRT-P   Follows with [facility] Fleming   Last echo in 2021 LVEF 40 to 45%    Overall volume status appears OK    She is fatigued with walking, overall not to active    Family member says labs and possibly echo done at PCP  Will get records  I have asked the pt and family that they try to have her weigh herself a few time per week Keep on same meds for now   2  Hx of PAF  Heart rate is regular today  Keep on Eliquis.  3  HL    patient on Crestor.   Last lipids on record in 2021  Check with PCP  5  HTN  Good well controlled    Current medicines are reviewed at length with the patient today.  The patient does not have concerns regarding medicines.  Signed, Dietrich Pates, MD  08/11/2022 4:24 PM    [facility] [address] [place], [place], [place]  [address] Phone: [phone]; Fax: [phone]

## 2022-08-11 NOTE — Patient Instructions (Signed)
Medication Instructions:  Your physician recommends that you continue on your current medications as directed. Please refer to the Current Medication list given to you today.  *If you need a refill on your cardiac medications before your next appointment, please call your pharmacy*   Lab Work: None ordered today.  Testing/Procedures: None ordered today.   Follow-Up: We will call you to discuss follow-up after Dr. Tenny Craw reviews your labs from Dr. Stefanie Libel office.

## 2022-08-11 NOTE — Progress Notes (Signed)
Remote pacemaker transmission.   

## 2022-08-12 LAB — LAB REPORT - SCANNED
A1c: 6.7
EGFR: 62

## 2022-08-24 ENCOUNTER — Telehealth: Payer: Self-pay

## 2022-08-24 DIAGNOSIS — R0609 Other forms of dyspnea: Secondary | ICD-10-CM

## 2022-08-24 DIAGNOSIS — I509 Heart failure, unspecified: Secondary | ICD-10-CM

## 2022-08-24 NOTE — Telephone Encounter (Signed)
Pts daughter advised and verbalized understanding of her instructions.

## 2022-08-24 NOTE — Telephone Encounter (Signed)
-----   Message from Pricilla Riffle, MD sent at 08/23/2022  9:32 PM EDT ----- Echo report from primary MD   LVEF is normal  Images not sent I would recomm a lexiscan myovew to r/u severe CAD/ischemia to explain dyspnea

## 2022-08-25 ENCOUNTER — Telehealth (HOSPITAL_COMMUNITY): Payer: Self-pay

## 2022-08-25 NOTE — Telephone Encounter (Signed)
Detailed instructions left on the patient's answering machine. Asked to call back with any questions. S.Ethne Jeon EMTP/CCT 

## 2022-08-30 ENCOUNTER — Ambulatory Visit (HOSPITAL_COMMUNITY): Payer: Medicare HMO | Attending: Internal Medicine

## 2022-08-30 ENCOUNTER — Other Ambulatory Visit: Payer: Self-pay | Admitting: Internal Medicine

## 2022-08-30 DIAGNOSIS — I509 Heart failure, unspecified: Secondary | ICD-10-CM | POA: Insufficient documentation

## 2022-08-30 DIAGNOSIS — R0609 Other forms of dyspnea: Secondary | ICD-10-CM | POA: Diagnosis present

## 2022-08-30 LAB — MYOCARDIAL PERFUSION IMAGING
LV dias vol: 30 mL (ref 46–106)
LV sys vol: 8 mL
Nuc Stress EF: 72 %
Peak HR: 118 {beats}/min
Rest HR: 75 {beats}/min
Rest Nuclear Isotope Dose: 9.8 mCi
SDS: 2
SRS: 0
SSS: 2
ST Depression (mm): 0 mm
Stress Nuclear Isotope Dose: 32.3 mCi
TID: 0.97

## 2022-08-30 MED ORDER — TECHNETIUM TC 99M TETROFOSMIN IV KIT
9.8000 | PACK | Freq: Once | INTRAVENOUS | Status: AC | PRN
  Administered 2022-08-30: 9.8 via INTRAVENOUS

## 2022-08-30 MED ORDER — TECHNETIUM TC 99M TETROFOSMIN IV KIT
32.3000 | PACK | Freq: Once | INTRAVENOUS | Status: AC | PRN
  Administered 2022-08-30: 32.3 via INTRAVENOUS

## 2022-08-30 MED ORDER — REGADENOSON 0.4 MG/5ML IV SOLN
0.4000 mg | Freq: Once | INTRAVENOUS | Status: AC
  Administered 2022-08-30: 0.4 mg via INTRAVENOUS

## 2022-10-18 ENCOUNTER — Ambulatory Visit (INDEPENDENT_AMBULATORY_CARE_PROVIDER_SITE_OTHER): Payer: Medicare HMO

## 2022-10-18 DIAGNOSIS — I428 Other cardiomyopathies: Secondary | ICD-10-CM

## 2022-10-19 LAB — CUP PACEART REMOTE DEVICE CHECK
Battery Remaining Longevity: 32 mo
Battery Remaining Percentage: 38 %
Battery Voltage: 2.95 V
Brady Statistic AP VP Percent: 23 %
Brady Statistic AP VS Percent: 1 %
Brady Statistic AS VP Percent: 76 %
Brady Statistic AS VS Percent: 1 %
Brady Statistic RA Percent Paced: 22 %
Date Time Interrogation Session: 20240903020014
Implantable Lead Connection Status: 753985
Implantable Lead Connection Status: 753985
Implantable Lead Connection Status: 753985
Implantable Lead Implant Date: 20081110
Implantable Lead Implant Date: 20081110
Implantable Lead Implant Date: 20081110
Implantable Lead Location: 753858
Implantable Lead Location: 753859
Implantable Lead Location: 753860
Implantable Pulse Generator Implant Date: 20201204
Lead Channel Impedance Value: 540 Ohm
Lead Channel Impedance Value: 590 Ohm
Lead Channel Impedance Value: 840 Ohm
Lead Channel Pacing Threshold Amplitude: 0.625 V
Lead Channel Pacing Threshold Amplitude: 1 V
Lead Channel Pacing Threshold Amplitude: 2 V
Lead Channel Pacing Threshold Pulse Width: 0.4 ms
Lead Channel Pacing Threshold Pulse Width: 0.4 ms
Lead Channel Pacing Threshold Pulse Width: 1.5 ms
Lead Channel Sensing Intrinsic Amplitude: 12 mV
Lead Channel Sensing Intrinsic Amplitude: 2.7 mV
Lead Channel Setting Pacing Amplitude: 1.625
Lead Channel Setting Pacing Amplitude: 2 V
Lead Channel Setting Pacing Amplitude: 2.5 V
Lead Channel Setting Pacing Pulse Width: 0.4 ms
Lead Channel Setting Pacing Pulse Width: 1.5 ms
Lead Channel Setting Sensing Sensitivity: 4 mV
Pulse Gen Model: 3222
Pulse Gen Serial Number: 9132853

## 2022-10-28 ENCOUNTER — Other Ambulatory Visit: Payer: Self-pay | Admitting: Internal Medicine

## 2022-10-28 DIAGNOSIS — I480 Paroxysmal atrial fibrillation: Secondary | ICD-10-CM

## 2022-10-28 NOTE — Telephone Encounter (Signed)
Eliquis 5mg  refill request received. Patient is 77 years old, weight-71.7kg, Crea-1.02 on 04/20/22, Diagnosis-Afib, and last seen by Dr. Tenny Craw on 08/11/22. Dose is appropriate based on dosing criteria. Will send in refill to requested pharmacy.

## 2022-10-31 NOTE — Progress Notes (Signed)
Remote pacemaker transmission.   

## 2022-11-30 ENCOUNTER — Other Ambulatory Visit: Payer: Self-pay | Admitting: Internal Medicine

## 2023-01-15 ENCOUNTER — Other Ambulatory Visit (HOSPITAL_BASED_OUTPATIENT_CLINIC_OR_DEPARTMENT_OTHER): Payer: Self-pay | Admitting: Internal Medicine

## 2023-01-17 ENCOUNTER — Ambulatory Visit (INDEPENDENT_AMBULATORY_CARE_PROVIDER_SITE_OTHER): Payer: Medicare HMO

## 2023-01-17 DIAGNOSIS — I428 Other cardiomyopathies: Secondary | ICD-10-CM

## 2023-01-18 LAB — CUP PACEART REMOTE DEVICE CHECK
Battery Remaining Longevity: 28 mo
Battery Remaining Percentage: 34 %
Battery Voltage: 2.93 V
Brady Statistic AP VP Percent: 23 %
Brady Statistic AP VS Percent: 1 %
Brady Statistic AS VP Percent: 76 %
Brady Statistic AS VS Percent: 1 %
Brady Statistic RA Percent Paced: 21 %
Date Time Interrogation Session: 20241203025517
Implantable Lead Connection Status: 753985
Implantable Lead Connection Status: 753985
Implantable Lead Connection Status: 753985
Implantable Lead Implant Date: 20081110
Implantable Lead Implant Date: 20081110
Implantable Lead Implant Date: 20081110
Implantable Lead Location: 753858
Implantable Lead Location: 753859
Implantable Lead Location: 753860
Implantable Pulse Generator Implant Date: 20201204
Lead Channel Impedance Value: 440 Ohm
Lead Channel Impedance Value: 510 Ohm
Lead Channel Impedance Value: 890 Ohm
Lead Channel Pacing Threshold Amplitude: 0.75 V
Lead Channel Pacing Threshold Amplitude: 1.125 V
Lead Channel Pacing Threshold Amplitude: 2 V
Lead Channel Pacing Threshold Pulse Width: 0.4 ms
Lead Channel Pacing Threshold Pulse Width: 0.4 ms
Lead Channel Pacing Threshold Pulse Width: 1.5 ms
Lead Channel Sensing Intrinsic Amplitude: 12 mV
Lead Channel Sensing Intrinsic Amplitude: 3.6 mV
Lead Channel Setting Pacing Amplitude: 1.75 V
Lead Channel Setting Pacing Amplitude: 2.125
Lead Channel Setting Pacing Amplitude: 2.5 V
Lead Channel Setting Pacing Pulse Width: 0.4 ms
Lead Channel Setting Pacing Pulse Width: 1.5 ms
Lead Channel Setting Sensing Sensitivity: 4 mV
Pulse Gen Model: 3222
Pulse Gen Serial Number: 9132853

## 2023-03-02 ENCOUNTER — Telehealth: Payer: Self-pay | Admitting: Internal Medicine

## 2023-03-02 NOTE — Telephone Encounter (Signed)
Per Daughter it is a struggle to get her to [place]. They would like to switch from Dr Tenny Craw in [place] to Dr Tomie China in [place]. Please advise

## 2023-03-02 NOTE — Telephone Encounter (Signed)
Per doctor it is a struggle to get her to [place]. They want to switch from Dr Graciela Husbands in [place] to Dr Elberta Fortis in [place] for EP. Please Advise

## 2023-03-13 ENCOUNTER — Telehealth: Payer: Self-pay | Admitting: Internal Medicine

## 2023-03-13 NOTE — Telephone Encounter (Signed)
Daughter Efraim Kaufmann) wants a provider switch from Dr. Graciela Husbands in [place] to Dr. Elberta Fortis in [place] as patient has dementia and daughter wants patient seen in [place].

## 2023-04-18 ENCOUNTER — Ambulatory Visit (INDEPENDENT_AMBULATORY_CARE_PROVIDER_SITE_OTHER): Payer: Medicare HMO

## 2023-04-18 DIAGNOSIS — I428 Other cardiomyopathies: Secondary | ICD-10-CM | POA: Diagnosis not present

## 2023-04-18 DIAGNOSIS — I509 Heart failure, unspecified: Secondary | ICD-10-CM

## 2023-04-19 ENCOUNTER — Ambulatory Visit: Payer: Medicare HMO | Attending: Cardiology | Admitting: Cardiology

## 2023-04-19 ENCOUNTER — Encounter: Payer: Self-pay | Admitting: Cardiology

## 2023-04-19 VITALS — BP 96/70 | HR 73 | Ht 64.0 in | Wt 148.4 lb

## 2023-04-19 DIAGNOSIS — G4733 Obstructive sleep apnea (adult) (pediatric): Secondary | ICD-10-CM

## 2023-04-19 DIAGNOSIS — I480 Paroxysmal atrial fibrillation: Secondary | ICD-10-CM

## 2023-04-19 DIAGNOSIS — Z950 Presence of cardiac pacemaker: Secondary | ICD-10-CM

## 2023-04-19 DIAGNOSIS — R0609 Other forms of dyspnea: Secondary | ICD-10-CM | POA: Diagnosis not present

## 2023-04-19 NOTE — Patient Instructions (Signed)
 Medication Instructions:  Your physician recommends that you continue on your current medications as directed. Please refer to the Current Medication list given to you today.  *If you need a refill on your cardiac medications before your next appointment, please call your pharmacy*   Lab Work: None Ordered If you have labs (blood work) drawn today and your tests are completely normal, you will receive your results only by: MyChart Message (if you have MyChart) OR A paper copy in the mail If you have any lab test that is abnormal or we need to change your treatment, we will call you to review the results.   Testing/Procedures: None Ordered   Follow-Up: At [facility], you and your health needs are our priority.  As part of our continuing mission to provide you with exceptional heart care, we have created designated Provider Care Teams.  These Care Teams include your primary Cardiologist (physician) and Advanced Practice Providers (APPs -  Physician Assistants and Nurse Practitioners) who all work together to provide you with the care you need, when you need it.  We recommend signing up for the patient portal called "MyChart".  Sign up information is provided on this After Visit Summary.  MyChart is used to connect with patients for Virtual Visits (Telemedicine).  Patients are able to view lab/test results, encounter notes, upcoming appointments, etc.  Non-urgent messages can be sent to your provider as well.   To learn more about what you can do with MyChart, go to [email or web address].    Your next appointment:   12 month follow up

## 2023-04-19 NOTE — Progress Notes (Unsigned)
 Cardiology Office Note:    Date:  04/19/2023   ID:  Kristin Gomez, DOB [birth year], MRN [id number]  PCP:  Galvin Proffer, MD  Cardiologist:  Garwin Brothers, MD   Referring MD: Galvin Proffer, MD    ASSESSMENT:    1. Dyspnea on exertion   2. PAF (paroxysmal atrial fibrillation) (HCC)   3. OSA (obstructive sleep apnea)   4. Biventricular cardiac pacemaker in situ    PLAN:    In order of problems listed above:  Primary prevention stressed with the patient.  Importance of compliance with diet medication stressed and patient verbalized standing. She was advised to ambulate to the best of her ability. Essential hypertension: Blood pressure stable and diet was emphasized.  She has low blood pressure today her daughter mentions to me that this is unusual for her.  They will keep a track of this.  She is asymptomatic.  No dizziness or any such symptoms Mixed dyslipidemia: On lipid-lowering medications followed by primary care. Paroxysmal atrial fibrillation:I discussed with the patient atrial fibrillation, disease process. Management and therapy including rate and rhythm control, anticoagulation benefits and potential risks were discussed extensively with the patient. Patient had multiple questions which were answered to patient's satisfaction. Permanent pacing: Followed by electrophysiology colleagues. Patient will be seen in follow-up appointment in 12 months or earlier if the patient has any concerns.    Medication Adjustments/Labs and Tests Ordered: Current medicines are reviewed at length with the patient today.  Concerns regarding medicines are outlined above.  Orders Placed This Encounter  Procedures   EKG 12-Lead   No orders of the defined types were placed in this encounter.    Chief Complaint  Patient presents with   Follow-up     History of Present Illness:    Kristin Gomez is a 78 y.o. female.  Patient has past medical history of essential hypertension,  cardiomyopathy and permanent pacemaker.  She denies any problems at this time.  She has history of dementia.  No chest pain orthopnea or PND.  At the time of my evaluation, the patient is alert awake oriented and in no distress.  She ambulates age appropriately.  Her daughter accompanies her for this visit.  Past Medical History:  Diagnosis Date   Arthritis    "all over" (09/26/2017)   Asthma    Diabetes mellitus    Fibromyalgia    Heart disease    High cholesterol    History of blood transfusion    "related to a surgery" (09/26/2017)   Hypertension    Memory difficulty 07/26/2017   Myocardial infarction [facility])    "years ago" (09/26/2017)   Pacemaker    Psoriatic arthritis, destructive type (HCC)    Stroke (HCC) 02/2017   "affected my memory" (09/26/2017)   Type II diabetes mellitus (HCC)     Past Surgical History:  Procedure Laterality Date   CARDIAC CATHETERIZATION     CATARACT EXTRACTION W/ INTRAOCULAR LENS  IMPLANT, BILATERAL Bilateral    CHOLECYSTECTOMY     COLONOSCOPY     I & D EXTREMITY  03/31/2011   Procedure: IRRIGATION AND DEBRIDEMENT EXTREMITY;  Surgeon: Tami Ribas, MD;  Location: [facility];  Service: Orthopedics;  Laterality: Left;  left long finger   INSERT / REPLACE / REMOVE PACEMAKER     KNEE ARTHROSCOPY Left    MASS EXCISION  03/08/2011   Procedure: EXCISION MASS;  Surgeon: Tami Ribas, MD;  Location: [facility];  Service: Orthopedics;  Laterality: Left;  Excision Mass Left Long Finger and Debridement of Distal Interphalangeal  Joint   PPM GENERATOR CHANGEOUT N/A 01/18/2019   Procedure: PPM GENERATOR CHANGEOUT;  Surgeon: Duke Salvia, MD;  Location: [facility]ASIVE CV [facility];  Service: Cardiovascular;  Laterality: N/A;   TONSILLECTOMY     TOTAL ABDOMINAL HYSTERECTOMY     TUBAL LIGATION      Current Medications: Current Meds  Medication Sig   albuterol (PROVENTIL HFA;VENTOLIN HFA) 108 (90 Base) MCG/ACT inhaler Inhale 2 puffs into the  lungs every 6 (six) hours as needed for wheezing or shortness of breath.    apixaban (ELIQUIS) 5 MG TABS tablet Take 1 tablet by mouth twice daily (Patient taking differently: Take 5 mg by mouth 2 (two) times daily.)   budesonide (PULMICORT) 0.5 MG/2ML nebulizer solution Take 2 mLs (0.5 mg total) by nebulization 2 (two) times daily.   carvedilol (COREG) 12.5 MG tablet TAKE 1 TABLET BY MOUTH TWICE DAILY WITH A MEAL   cetirizine (ZYRTEC) 10 MG tablet Take 10 mg by mouth as needed for allergies or rhinitis.   Cholecalciferol (VITAMIN D3) 50 MCG (2000 UT) capsule Take 2,000 Units by mouth daily.    diphenhydramine-acetaminophen (TYLENOL PM) 25-500 MG TABS tablet Take 1-2 tablets by mouth at bedtime as needed (pain).   fluticasone (FLONASE) 50 MCG/ACT nasal spray Place 1 spray into both nostrils 2 (two) times daily. Then decrease to daily. (Patient taking differently: Place 1 spray into both nostrils as needed for allergies or rhinitis.)   furosemide (LASIX) 20 MG tablet TAKE 1 TABLET BY MOUTH ONCE DAILY. INCREASE  AS  DIRECTED  BASED  ON  WEIGHT. (Patient taking differently: Take 20 mg by mouth daily.)   levothyroxine (SYNTHROID) 25 MCG tablet Take 25 mcg by mouth daily before breakfast.   memantine (NAMENDA) 10 MG tablet Take 1 tablet by mouth twice daily (Patient taking differently: Take 10 mg by mouth 2 (two) times daily.)   methylPREDNISolone (MEDROL) 4 MG tablet Take 1 mg by mouth as needed (Fibermyalgia).   omeprazole (PRILOSEC) 20 MG capsule Take 20 mg by mouth daily as needed (acid reflux).    ondansetron (ZOFRAN) 4 MG tablet Take 4 mg by mouth as needed for nausea or vomiting.   potassium chloride SA (K-DUR,KLOR-CON) 20 MEQ tablet Take 20 mEq by mouth daily.   Probiotic Product (PROBIOTIC PO) Take 1 tablet by mouth in the morning and at bedtime.   QUEtiapine (SEROQUEL) 25 MG tablet Take 25 mg by mouth daily.   rosuvastatin (CRESTOR) 20 MG tablet Take 1 tablet (20 mg total) by mouth daily.    [DISCONTINUED] albuterol (PROVENTIL) (2.5 MG/3ML) 0.083% nebulizer solution Take 3 mLs (2.5 mg total) by nebulization every 4 (four) hours as needed for wheezing or shortness of breath.   [DISCONTINUED] arformoterol (BROVANA) 15 MCG/2ML NEBU Take 2 mLs (15 mcg total) by nebulization 2 (two) times daily.   [DISCONTINUED] clonazePAM (KLONOPIN) 0.5 MG tablet Take 0.5 mg by mouth 2 (two) times daily as needed for anxiety.   [DISCONTINUED] Infant Care Products [facility]) OINT Apply 1 Application topically daily as needed (inflammation).   [DISCONTINUED] ipratropium-albuterol (DUONEB) 0.5-2.5 (3) MG/3ML SOLN Take 3 mLs by nebulization every 6 (six) hours as needed (for shortness of breath or wheezing).    [DISCONTINUED] RYBELSUS 3 MG TABS Take 1 tablet by mouth daily at 6 (six) AM.     Allergies:   Aspirin, Azithromycin, Infliximab, Methotrexate, Mushroom extract complex (obsolete), Nsaids,  Avelox [moxifloxacin hcl in nacl], Diovan [valsartan], Donepezil, Donepezil hcl, Moxifloxacin, Other, Oxycodone-acetaminophen, Amoxicillin, Atorvastatin, Contrast media [iodinated contrast media], Iodine, Latex, Metformin, Metformin and related, Penicillins, Percocet [oxycodone-acetaminophen], Prednisone, Propofol, Sulfa antibiotics, and Vicodin [hydrocodone-acetaminophen]   Social History   Socioeconomic History   Marital status: Widowed    Spouse name: Not on file   Number of children: 2   Years of education: College   Highest education level: Not on file  Occupational History   Not on file  Tobacco Use   Smoking status: Never   Smokeless tobacco: Never  Vaping Use   Vaping status: Never Used  Substance and Sexual Activity   Alcohol use: Never   Drug use: Never   Sexual activity: Not Currently  Other Topics Concern   Not on file  Social History Narrative   Lives with daughter   Caffeine use: diet drinks   diet coke out to eat   Rig thhanded    Social Drivers of Health   Financial Resource  Strain: Not on file  Food Insecurity: Not on file  Transportation Needs: Not on file  Physical Activity: Not on file  Stress: Not on file  Social Connections: Not on file     Family History: The patient's family history includes Alzheimer's disease in her mother; Diabetes in her brother; Heart attack in her father.  ROS:   Please see the history of present illness.    All other systems reviewed and are negative.  EKGs/Labs/Other Studies Reviewed:    The following studies were reviewed today: .Marland Kitchen      Recent Labs: 04/20/2022: ALT 22; BUN 11; Creatinine, Ser 1.02; Hemoglobin 15.5; Platelets 176; Potassium 3.8; Sodium 140  Recent Lipid Panel    Component Value Date/Time   CHOL 152 01/03/2020 1416   TRIG 147 01/03/2020 1416   HDL 62 01/03/2020 1416   CHOLHDL 2.5 01/03/2020 1416   LDLCALC 65 01/03/2020 1416    Physical Exam:    VS:  BP 96/70 (BP Location: Right Arm, Patient Position: Sitting)   Pulse 73   Ht 5\' 4"  (1.626 m)   Wt 148 lb 6.4 oz (67.3 kg)   SpO2 96%   BMI 25.47 kg/m     Wt Readings from Last 3 Encounters:  04/19/23 148 lb 6.4 oz (67.3 kg)  08/30/22 158 lb (71.7 kg)  08/11/22 158 lb 9.6 oz (71.9 kg)     GEN: Patient is in no acute distress HEENT: Normal NECK: No JVD; No carotid bruits LYMPHATICS: No lymphadenopathy CARDIAC: Hear sounds regular, 2/6 systolic murmur at the apex. RESPIRATORY:  Clear to auscultation without rales, wheezing or rhonchi  ABDOMEN: Soft, non-tender, non-distended MUSCULOSKELETAL:  No edema; No deformity  SKIN: Warm and dry NEUROLOGIC:  Alert and oriented x 3 PSYCHIATRIC:  Normal affect   Signed, Garwin Brothers, MD  04/19/2023 4:30 PM    [facility]

## 2023-04-20 LAB — CUP PACEART REMOTE DEVICE CHECK
Battery Remaining Longevity: 25 mo
Battery Remaining Percentage: 30 %
Battery Voltage: 2.93 V
Brady Statistic AP VP Percent: 24 %
Brady Statistic AP VS Percent: 1 %
Brady Statistic AS VP Percent: 75 %
Brady Statistic AS VS Percent: 1 %
Brady Statistic RA Percent Paced: 23 %
Date Time Interrogation Session: 20250304020012
Implantable Lead Connection Status: 753985
Implantable Lead Connection Status: 753985
Implantable Lead Connection Status: 753985
Implantable Lead Implant Date: 20081110
Implantable Lead Implant Date: 20081110
Implantable Lead Implant Date: 20081110
Implantable Lead Location: 753858
Implantable Lead Location: 753859
Implantable Lead Location: 753860
Implantable Pulse Generator Implant Date: 20201204
Lead Channel Impedance Value: 460 Ohm
Lead Channel Impedance Value: 480 Ohm
Lead Channel Impedance Value: 840 Ohm
Lead Channel Pacing Threshold Amplitude: 0.625 V
Lead Channel Pacing Threshold Amplitude: 1.25 V
Lead Channel Pacing Threshold Amplitude: 2 V
Lead Channel Pacing Threshold Pulse Width: 0.4 ms
Lead Channel Pacing Threshold Pulse Width: 0.4 ms
Lead Channel Pacing Threshold Pulse Width: 1.5 ms
Lead Channel Sensing Intrinsic Amplitude: 12 mV
Lead Channel Sensing Intrinsic Amplitude: 2.2 mV
Lead Channel Setting Pacing Amplitude: 1.625
Lead Channel Setting Pacing Amplitude: 2.25 V
Lead Channel Setting Pacing Amplitude: 2.5 V
Lead Channel Setting Pacing Pulse Width: 0.4 ms
Lead Channel Setting Pacing Pulse Width: 1.5 ms
Lead Channel Setting Sensing Sensitivity: 4 mV
Pulse Gen Model: 3222
Pulse Gen Serial Number: 9132853

## 2023-04-26 ENCOUNTER — Other Ambulatory Visit: Payer: Self-pay | Admitting: Internal Medicine

## 2023-04-26 DIAGNOSIS — I480 Paroxysmal atrial fibrillation: Secondary | ICD-10-CM

## 2023-04-26 NOTE — Telephone Encounter (Signed)
 Prescription refill request for Eliquis received. Indication:Afib  Last office visit: 04/19/23 (Revankar)  Scr: 0.94 (08/11/22)  Age: 78 Weight: 67.3kg  Appropriate dose. Refill sent.

## 2023-05-24 NOTE — Addendum Note (Signed)
 Addended by: Geralyn Flash D on: 05/24/2023 02:04 PM   Modules accepted: Orders

## 2023-05-24 NOTE — Progress Notes (Signed)
 Remote pacemaker transmission.

## 2023-07-03 ENCOUNTER — Ambulatory Visit: Payer: Medicare HMO | Attending: Cardiology | Admitting: Cardiology

## 2023-07-03 ENCOUNTER — Encounter: Payer: Self-pay | Admitting: Cardiology

## 2023-07-03 VITALS — BP 112/78 | HR 74 | Resp 97 | Ht 65.0 in | Wt 150.8 lb

## 2023-07-03 DIAGNOSIS — D6869 Other thrombophilia: Secondary | ICD-10-CM

## 2023-07-03 DIAGNOSIS — I10 Essential (primary) hypertension: Secondary | ICD-10-CM | POA: Diagnosis not present

## 2023-07-03 DIAGNOSIS — I480 Paroxysmal atrial fibrillation: Secondary | ICD-10-CM | POA: Diagnosis not present

## 2023-07-03 DIAGNOSIS — I5022 Chronic systolic (congestive) heart failure: Secondary | ICD-10-CM

## 2023-07-03 LAB — CUP PACEART INCLINIC DEVICE CHECK
Battery Remaining Longevity: 21 mo
Battery Voltage: 2.93 V
Brady Statistic RA Percent Paced: 24 %
Brady Statistic RV Percent Paced: 99 %
Date Time Interrogation Session: 20250519213751
Implantable Lead Connection Status: 753985
Implantable Lead Connection Status: 753985
Implantable Lead Connection Status: 753985
Implantable Lead Implant Date: 20081110
Implantable Lead Implant Date: 20081110
Implantable Lead Implant Date: 20081110
Implantable Lead Location: 753858
Implantable Lead Location: 753859
Implantable Lead Location: 753860
Implantable Pulse Generator Implant Date: 20201204
Lead Channel Impedance Value: 537.5 Ohm
Lead Channel Impedance Value: 587.5 Ohm
Lead Channel Impedance Value: 925 Ohm
Lead Channel Pacing Threshold Amplitude: 0.75 V
Lead Channel Pacing Threshold Amplitude: 0.75 V
Lead Channel Pacing Threshold Amplitude: 1.25 V
Lead Channel Pacing Threshold Amplitude: 1.25 V
Lead Channel Pacing Threshold Amplitude: 1.25 V
Lead Channel Pacing Threshold Amplitude: 1.25 V
Lead Channel Pacing Threshold Pulse Width: 0.4 ms
Lead Channel Pacing Threshold Pulse Width: 0.4 ms
Lead Channel Pacing Threshold Pulse Width: 0.4 ms
Lead Channel Pacing Threshold Pulse Width: 0.4 ms
Lead Channel Pacing Threshold Pulse Width: 1.5 ms
Lead Channel Pacing Threshold Pulse Width: 1.5 ms
Lead Channel Sensing Intrinsic Amplitude: 2.5 mV
Lead Channel Setting Pacing Amplitude: 1.75 V
Lead Channel Setting Pacing Amplitude: 2.125
Lead Channel Setting Pacing Amplitude: 2.5 V
Lead Channel Setting Pacing Pulse Width: 0.4 ms
Lead Channel Setting Pacing Pulse Width: 1.5 ms
Lead Channel Setting Sensing Sensitivity: 4 mV
Pulse Gen Model: 3222
Pulse Gen Serial Number: 9132853

## 2023-07-03 NOTE — Progress Notes (Signed)
  Electrophysiology Office Note:   Date:  07/03/2023  ID:  AVYNN KLASSEN, DOB 26-Nov-1945, MRN [id number]  Primary Cardiologist: Ola Berger, MD Primary Heart Failure: None Electrophysiologist: None      History of Present Illness:   Kristin Gomez is a 78 y.o. female with h/o chronic systolic heart failure, atrial fibrillation, rheumatoid arthritis, CVA, diabetes seen today for routine electrophysiology followup.   Since last being seen in our clinic the patient reports doing well.  She has no chest pain or shortness of breath.  She is tired today, but otherwise has no acute complaints.  She is able to do her daily activities.  she denies chest pain, palpitations, dyspnea, PND, orthopnea, nausea, vomiting, dizziness, syncope, edema, weight gain, or early satiety.   Review of systems complete and found to be negative unless listed in HPI.      EP Information / Studies Reviewed:    EKG is not ordered today. EKG from 04/19/2023 reviewed which showed atrial sensed, ventricular paced      PPM Interrogation-  reviewed in detail today,  See PACEART report.  Device History: Abbott BiV PPM implanted for systolic heart failure  Risk Assessment/Calculations:    CHA2DS2-VASc Score = 8   This indicates a 10.8% annual risk of stroke. The patient's score is based upon: CHF History: 1 HTN History: 1 Diabetes History: 1 Stroke History: 2 Vascular Disease History: 0 Age Score: 2 Gender Score: 1           Physical Exam:   VS:  BP 112/78   Pulse 74   Resp (!) 97   Ht 5\' 5"  (1.651 m)   Wt 150 lb 12.8 oz (68.4 kg)   BMI 25.09 kg/m    Wt Readings from Last 3 Encounters:  07/03/23 150 lb 12.8 oz (68.4 kg)  04/19/23 148 lb 6.4 oz (67.3 kg)  08/30/22 158 lb (71.7 kg)     GEN: Well nourished, well developed in no acute distress NECK: No JVD; No carotid bruits CARDIAC: Regular rate and rhythm, no murmurs, rubs, gallops RESPIRATORY:  Clear to auscultation without rales, wheezing or  rhonchi  ABDOMEN: Soft, non-tender, non-distended EXTREMITIES:  No edema; No deformity   ASSESSMENT AND PLAN:    Chronic systolic heart failure s/p Med[employer] PPM  Normal PPM function Sensing, threshold, impedance within normal limits Device programming reviewed and appropriate Medical therapy per primary cardiology See Valeta Gaudier Art report No changes today  2.  Paroxysmal atrial fibrillation: Low burden.  Continue current management.  3.  Secondary hypercoagulable state: On Eliquis   4.  Hypertension: Well-controlled  Disposition:   Follow up with Dr. Lawana Pray in 12 months  Signed, Ivett Luebbe Cortland Ding, MD

## 2023-07-03 NOTE — Patient Instructions (Signed)
 Medication Instructions:  Your physician recommends that you continue on your current medications as directed. Please refer to the Current Medication list given to you today.  *If you need a refill on your cardiac medications before your next appointment, please call your pharmacy*  Lab Work: None ordered  Testing/Procedures: None ordered  Follow-Up: At [facility], you and your health needs are our priority.  As part of our continuing mission to provide you with exceptional heart care, our providers are all part of one team.  This team includes your primary Cardiologist (physician) and Advanced Practice Providers or APPs (Physician Assistants and Nurse Practitioners) who all work together to provide you with the care you need, when you need it.  Your next appointment:   1 year(s)  Provider:   Agatha Horsfall, MD    Thank you for choosing [facility]!!   Reece Cane, RN [phone]

## 2023-07-05 ENCOUNTER — Ambulatory Visit: Payer: Self-pay | Admitting: Cardiology

## 2023-07-18 ENCOUNTER — Ambulatory Visit (INDEPENDENT_AMBULATORY_CARE_PROVIDER_SITE_OTHER): Payer: Medicare HMO

## 2023-07-18 DIAGNOSIS — I428 Other cardiomyopathies: Secondary | ICD-10-CM

## 2023-07-18 LAB — CUP PACEART REMOTE DEVICE CHECK
Battery Remaining Longevity: 20 mo
Battery Remaining Percentage: 26 %
Battery Voltage: 2.93 V
Brady Statistic AP VP Percent: 31 %
Brady Statistic AP VS Percent: 1 %
Brady Statistic AS VP Percent: 67 %
Brady Statistic AS VS Percent: 1 %
Brady Statistic RA Percent Paced: 30 %
Date Time Interrogation Session: 20250603020014
Implantable Lead Connection Status: 753985
Implantable Lead Connection Status: 753985
Implantable Lead Connection Status: 753985
Implantable Lead Implant Date: 20081110
Implantable Lead Implant Date: 20081110
Implantable Lead Implant Date: 20081110
Implantable Lead Location: 753858
Implantable Lead Location: 753859
Implantable Lead Location: 753860
Implantable Pulse Generator Implant Date: 20201204
Lead Channel Impedance Value: 450 Ohm
Lead Channel Impedance Value: 460 Ohm
Lead Channel Impedance Value: 890 Ohm
Lead Channel Pacing Threshold Amplitude: 0.75 V
Lead Channel Pacing Threshold Amplitude: 1.125 V
Lead Channel Pacing Threshold Amplitude: 1.25 V
Lead Channel Pacing Threshold Pulse Width: 0.4 ms
Lead Channel Pacing Threshold Pulse Width: 0.4 ms
Lead Channel Pacing Threshold Pulse Width: 1.5 ms
Lead Channel Sensing Intrinsic Amplitude: 12 mV
Lead Channel Sensing Intrinsic Amplitude: 2.4 mV
Lead Channel Setting Pacing Amplitude: 1.75 V
Lead Channel Setting Pacing Amplitude: 2.125
Lead Channel Setting Pacing Amplitude: 2.5 V
Lead Channel Setting Pacing Pulse Width: 0.4 ms
Lead Channel Setting Pacing Pulse Width: 1.5 ms
Lead Channel Setting Sensing Sensitivity: 4 mV
Pulse Gen Model: 3222
Pulse Gen Serial Number: 9132853

## 2023-07-25 ENCOUNTER — Other Ambulatory Visit (HOSPITAL_BASED_OUTPATIENT_CLINIC_OR_DEPARTMENT_OTHER): Payer: Self-pay | Admitting: Internal Medicine

## 2023-07-26 ENCOUNTER — Ambulatory Visit: Payer: Self-pay | Admitting: Cardiology

## 2023-09-13 NOTE — Progress Notes (Signed)
 Remote pacemaker transmission.

## 2023-10-11 ENCOUNTER — Other Ambulatory Visit (HOSPITAL_BASED_OUTPATIENT_CLINIC_OR_DEPARTMENT_OTHER): Payer: Self-pay | Admitting: Family

## 2023-10-11 ENCOUNTER — Ambulatory Visit (INDEPENDENT_AMBULATORY_CARE_PROVIDER_SITE_OTHER)
Admission: RE | Admit: 2023-10-11 | Discharge: 2023-10-11 | Disposition: A | Discharge status: Home / Self Care | Source: Ambulatory Visit | Attending: Family | Admitting: Family

## 2023-10-11 DIAGNOSIS — M25561 Pain in right knee: Secondary | ICD-10-CM

## 2023-10-11 DIAGNOSIS — M25521 Pain in right elbow: Secondary | ICD-10-CM | POA: Diagnosis not present

## 2023-10-11 DIAGNOSIS — W19XXXA Unspecified fall, initial encounter: Secondary | ICD-10-CM

## 2023-10-17 ENCOUNTER — Ambulatory Visit (INDEPENDENT_AMBULATORY_CARE_PROVIDER_SITE_OTHER): Payer: Medicare HMO

## 2023-10-17 DIAGNOSIS — I428 Other cardiomyopathies: Secondary | ICD-10-CM

## 2023-10-19 LAB — CUP PACEART REMOTE DEVICE CHECK
Battery Remaining Longevity: 17 mo
Battery Remaining Percentage: 23 %
Battery Voltage: 2.9 V
Brady Statistic AP VP Percent: 32 %
Brady Statistic AP VS Percent: 1 %
Brady Statistic AS VP Percent: 67 %
Brady Statistic AS VS Percent: 1 %
Brady Statistic RA Percent Paced: 31 %
Date Time Interrogation Session: 20250902020014
Implantable Lead Connection Status: 753985
Implantable Lead Connection Status: 753985
Implantable Lead Connection Status: 753985
Implantable Lead Implant Date: 20081110
Implantable Lead Implant Date: 20081110
Implantable Lead Implant Date: 20081110
Implantable Lead Location: 753858
Implantable Lead Location: 753859
Implantable Lead Location: 753860
Implantable Pulse Generator Implant Date: 20201204
Lead Channel Impedance Value: 450 Ohm
Lead Channel Impedance Value: 460 Ohm
Lead Channel Impedance Value: 840 Ohm
Lead Channel Pacing Threshold Amplitude: 0.625 V
Lead Channel Pacing Threshold Amplitude: 1.125 V
Lead Channel Pacing Threshold Amplitude: 1.25 V
Lead Channel Pacing Threshold Pulse Width: 0.4 ms
Lead Channel Pacing Threshold Pulse Width: 0.4 ms
Lead Channel Pacing Threshold Pulse Width: 1.5 ms
Lead Channel Sensing Intrinsic Amplitude: 12 mV
Lead Channel Sensing Intrinsic Amplitude: 2.2 mV
Lead Channel Setting Pacing Amplitude: 1.625
Lead Channel Setting Pacing Amplitude: 2.125
Lead Channel Setting Pacing Amplitude: 2.5 V
Lead Channel Setting Pacing Pulse Width: 0.4 ms
Lead Channel Setting Pacing Pulse Width: 1.5 ms
Lead Channel Setting Sensing Sensitivity: 4 mV
Pulse Gen Model: 3222
Pulse Gen Serial Number: 9132853

## 2023-10-20 ENCOUNTER — Ambulatory Visit: Payer: Self-pay | Admitting: Cardiology

## 2023-10-20 ENCOUNTER — Other Ambulatory Visit (HOSPITAL_BASED_OUTPATIENT_CLINIC_OR_DEPARTMENT_OTHER): Payer: Self-pay | Admitting: Internal Medicine

## 2023-10-21 ENCOUNTER — Other Ambulatory Visit: Payer: Self-pay | Admitting: Cardiology

## 2023-10-21 DIAGNOSIS — I480 Paroxysmal atrial fibrillation: Secondary | ICD-10-CM

## 2023-10-23 NOTE — Telephone Encounter (Addendum)
 Prescription refill request for Eliquis  received. Indication:afib Last office visit:5/25 Scr:  needs labs Age: 78 Weight:68.4  kg  Prescription refilled

## 2023-10-24 NOTE — Progress Notes (Signed)
 Remote PPM Transmission

## 2023-11-17 ENCOUNTER — Other Ambulatory Visit: Payer: Self-pay | Admitting: Cardiology

## 2023-11-17 DIAGNOSIS — I480 Paroxysmal atrial fibrillation: Secondary | ICD-10-CM

## 2023-11-17 NOTE — Telephone Encounter (Signed)
 Left VM for patient to come in the office for labs.

## 2023-11-17 NOTE — Telephone Encounter (Signed)
 Prescription refill request for Eliquis  received. Indication:afib Last office visit:5/25 Scr:  needs labs Age: 78 Weight:68.4  kg  Prescription refilled

## 2023-11-19 ENCOUNTER — Other Ambulatory Visit: Payer: Self-pay | Admitting: Internal Medicine

## 2023-12-08 ENCOUNTER — Ambulatory Visit: Payer: Self-pay | Admitting: Cardiology

## 2023-12-08 LAB — CBC WITH DIFFERENTIAL/PLATELET
Basophils Absolute: 0.1 x10E3/uL (ref 0.0–0.2)
Basos: 1 %
EOS (ABSOLUTE): 0.1 x10E3/uL (ref 0.0–0.4)
Eos: 1 %
Hematocrit: 44.9 % (ref 34.0–46.6)
Hemoglobin: 14.8 g/dL (ref 11.1–15.9)
Immature Grans (Abs): 0.1 x10E3/uL (ref 0.0–0.1)
Immature Granulocytes: 1 %
Lymphocytes Absolute: 1.4 x10E3/uL (ref 0.7–3.1)
Lymphs: 18 %
MCH: 32.3 pg (ref 26.6–33.0)
MCHC: 33 g/dL (ref 31.5–35.7)
MCV: 98 fL — ABNORMAL HIGH (ref 79–97)
Monocytes Absolute: 0.4 x10E3/uL (ref 0.1–0.9)
Monocytes: 6 %
Neutrophils Absolute: 5.9 x10E3/uL (ref 1.4–7.0)
Neutrophils: 73 %
Platelets: 210 x10E3/uL (ref 150–450)
RBC: 4.58 x10E6/uL (ref 3.77–5.28)
RDW: 12.6 % (ref 11.7–15.4)
WBC: 8 x10E3/uL (ref 3.4–10.8)

## 2023-12-08 LAB — BASIC METABOLIC PANEL WITH GFR
BUN/Creatinine Ratio: 14 (ref 12–28)
BUN: 17 mg/dL (ref 8–27)
CO2: 23 mmol/L (ref 20–29)
Calcium: 8.5 mg/dL — ABNORMAL LOW (ref 8.7–10.3)
Chloride: 103 mmol/L (ref 96–106)
Creatinine, Ser: 1.2 mg/dL — ABNORMAL HIGH (ref 0.57–1.00)
Glucose: 249 mg/dL — ABNORMAL HIGH (ref 70–99)
Potassium: 4.5 mmol/L (ref 3.5–5.2)
Sodium: 142 mmol/L (ref 134–144)
eGFR: 46 mL/min/1.73 — ABNORMAL LOW (ref >59)

## 2023-12-13 ENCOUNTER — Other Ambulatory Visit: Payer: Self-pay | Admitting: Cardiology

## 2023-12-13 DIAGNOSIS — I480 Paroxysmal atrial fibrillation: Secondary | ICD-10-CM

## 2023-12-14 NOTE — Telephone Encounter (Signed)
 Prescription refill request for Eliquis  received. Indication:afib Last office visit:5/25 Scr:1.20  10/25 Age: 78 Weight:68.4  kg  Prescription refilled

## 2024-01-03 ENCOUNTER — Ambulatory Visit (HOSPITAL_BASED_OUTPATIENT_CLINIC_OR_DEPARTMENT_OTHER): Admitting: Radiology

## 2024-01-03 ENCOUNTER — Ambulatory Visit (HOSPITAL_BASED_OUTPATIENT_CLINIC_OR_DEPARTMENT_OTHER): Payer: Self-pay

## 2024-01-03 ENCOUNTER — Other Ambulatory Visit (HOSPITAL_BASED_OUTPATIENT_CLINIC_OR_DEPARTMENT_OTHER): Payer: Self-pay

## 2024-01-03 ENCOUNTER — Ambulatory Visit (HOSPITAL_BASED_OUTPATIENT_CLINIC_OR_DEPARTMENT_OTHER)
Admission: EM | Admit: 2024-01-03 | Discharge: 2024-01-03 | Disposition: A | Discharge status: Home / Self Care | Attending: Family Medicine | Admitting: Family Medicine

## 2024-01-03 ENCOUNTER — Encounter (HOSPITAL_BASED_OUTPATIENT_CLINIC_OR_DEPARTMENT_OTHER): Payer: Self-pay

## 2024-01-03 DIAGNOSIS — G8929 Other chronic pain: Secondary | ICD-10-CM

## 2024-01-03 DIAGNOSIS — R1024 Suprapubic pain: Secondary | ICD-10-CM

## 2024-01-03 DIAGNOSIS — R1032 Left lower quadrant pain: Secondary | ICD-10-CM

## 2024-01-03 DIAGNOSIS — R1031 Right lower quadrant pain: Secondary | ICD-10-CM

## 2024-01-03 DIAGNOSIS — M545 Low back pain, unspecified: Secondary | ICD-10-CM

## 2024-01-03 DIAGNOSIS — R3915 Urgency of urination: Secondary | ICD-10-CM

## 2024-01-03 DIAGNOSIS — S22080A Wedge compression fracture of T11-T12 vertebra, initial encounter for closed fracture: Secondary | ICD-10-CM

## 2024-01-03 DIAGNOSIS — M62830 Muscle spasm of back: Secondary | ICD-10-CM

## 2024-01-03 DIAGNOSIS — R350 Frequency of micturition: Secondary | ICD-10-CM

## 2024-01-03 MED ORDER — TRAMADOL HCL 50 MG PO TABS
50.0000 mg | ORAL_TABLET | Freq: Four times a day (QID) | ORAL | 0 refills | Status: DC | PRN
  Filled 2024-01-03: qty 15, 4d supply, fill #0

## 2024-01-03 NOTE — ED Provider Notes (Signed)
 [facility]    CSN: [id number] Arrival date & time: 01/03/24  1109      History   Chief Complaint Chief Complaint  Patient presents with   Back Pain    HPI ELLIZABETH Gomez is a 78 y.o. female.   78 year old female who is here with her adult daughter.  The patient has had chronic back problems and has had back surgery.  She started to have lower back pain and spasms on 01/01/2024.  She had gone to the bathroom and then bent over to scratch her right lower leg and went into a back spasm and could not straighten up initially.  Her daughter has used some creams on her back and given her acetaminophen  with poor relief.  She is having trouble getting in and out of chairs with the bed.  She is having urinary frequency, urgency, lower abdominal pain and her urine has a very strong odor.  She is not having burning with urination.  She was brought in via wheelchair as she is having trouble walking on her own due to pain.   Back Pain Associated symptoms: abdominal pain   Associated symptoms: no chest pain, no dysuria and no fever     Past Medical History:  Diagnosis Date   Arthritis    all over (09/26/2017)   Asthma    Diabetes mellitus    Fibromyalgia    Heart disease    High cholesterol    History of blood transfusion    related to a surgery (09/26/2017)   Hypertension    Memory difficulty 07/26/2017   Myocardial infarction (HCC)    years ago (09/26/2017)   Pacemaker    Psoriatic arthritis, destructive type (HCC)    Stroke (HCC) 02/2017   affected my memory (09/26/2017)   Type II diabetes mellitus (HCC)     Patient Active Problem List   Diagnosis Date Noted   AKI (acute kidney injury) 11/18/2021   Primary osteoarthritis of first carpometacarpal joint of left hand 11/08/2019   NICM (nonischemic cardiomyopathy) (HCC) 04/21/2019   Biventricular cardiac pacemaker in situ 04/21/2019   CVA (cerebral vascular accident) (HCC) 08/29/2018   Colitis 09/26/2017   ARF  (acute renal failure) 09/26/2017   Diabetes mellitus type 2 in nonobese (HCC) 09/26/2017   Chronic CHF (congestive heart failure) (HCC) 09/26/2017   PAF (paroxysmal atrial fibrillation) (HCC) 09/26/2017   Memory difficulty 07/26/2017   SOB (shortness of breath) 04/21/2017   OSA (obstructive sleep apnea) 12/22/2016   Mucoid cyst of joint 04/04/2011   Goiter 11/25/2010   Bartholin's gland cyst 11/25/2010   Hypertension 11/25/2010   Fibromyalgia 11/25/2010    Past Surgical History:  Procedure Laterality Date   CARDIAC CATHETERIZATION     CATARACT EXTRACTION W/ INTRAOCULAR LENS  IMPLANT, BILATERAL Bilateral    CHOLECYSTECTOMY     COLONOSCOPY     I & D EXTREMITY  03/31/2011   Procedure: IRRIGATION AND DEBRIDEMENT EXTREMITY;  Surgeon: Franky JONELLE Curia, MD;  Location: [facility];  Service: Orthopedics;  Laterality: Left;  left long finger   INSERT / REPLACE / REMOVE PACEMAKER     KNEE ARTHROSCOPY Left    MASS EXCISION  03/08/2011   Procedure: EXCISION MASS;  Surgeon: Franky JONELLE Curia, MD;  Location: [facility];  Service: Orthopedics;  Laterality: Left;  Excision Mass Left Long Finger and Debridement of Distal Interphalangeal  Joint   PPM GENERATOR CHANGEOUT N/A 01/18/2019   Procedure: PPM GENERATOR CHANGEOUT;  Surgeon:  Fernande Elspeth BROCKS, MD;  Location: [facility];  Service: Cardiovascular;  Laterality: N/A;   TONSILLECTOMY     TOTAL ABDOMINAL HYSTERECTOMY     TUBAL LIGATION      OB History     Gravida  2   Para  2   Term  2   Preterm      AB      Living  2      SAB      IAB      Ectopic      Multiple      Live Births               Home Medications    Prior to Admission medications   Medication Sig Start Date End Date Taking? Authorizing Provider  traMADol  (ULTRAM ) 50 MG tablet Take 1 tablet (50 mg total) by mouth every 6 (six) hours as needed. 01/03/24  Yes Ival Domino, FNP  albuterol  (PROVENTIL  HFA;VENTOLIN  HFA) 108 (90  Base) MCG/ACT inhaler Inhale 2 puffs into the lungs every 6 (six) hours as needed for wheezing or shortness of breath.  11/20/12   [provider]  apixaban  (ELIQUIS ) 5 MG TABS tablet Take 1 tablet (5 mg total) by mouth 2 (two) times daily. 12/14/23   Revankar, Jennifer SAUNDERS, MD  budesonide  (PULMICORT ) 0.5 MG/2ML nebulizer solution Take 2 mLs (0.5 mg total) by nebulization 2 (two) times daily. 12/16/21   Hunsucker, Donnice SAUNDERS, MD  carvedilol  (COREG ) 12.5 MG tablet TAKE 1 TABLET BY MOUTH TWICE DAILY WITH A MEAL 11/21/23   Revankar, Jennifer SAUNDERS, MD  clonazePAM  (KLONOPIN ) 0.5 MG tablet Take 0.5 mg by mouth as directed. As needed    [provider]  diphenhydramine -acetaminophen  (TYLENOL  PM) 25-500 MG TABS tablet Take 1-2 tablets by mouth at bedtime as needed (pain).    [provider]  fluticasone  (FLONASE ) 50 MCG/ACT nasal spray Place 1 spray into both nostrils 2 (two) times daily. Then decrease to daily. Patient taking differently: Place 1 spray into both nostrils as needed for allergies or rhinitis. 02/24/22   Hunsucker, Donnice SAUNDERS, MD  furosemide  (LASIX ) 20 MG tablet TAKE 1 TABLET BY MOUTH ONCE DAILY. INCREASE AS DIRECTED BASED ON WEIGHT 10/20/23   Revankar, Jennifer SAUNDERS, MD  glipiZIDE (GLUCOTROL) 5 MG tablet Take 5 mg by mouth at bedtime.    [provider]  levothyroxine  (SYNTHROID ) 25 MCG tablet Take 25 mcg by mouth daily before breakfast.    [provider]  lisinopril  (ZESTRIL ) 5 MG tablet Take 5 mg by mouth daily.    [provider]  loratadine (CLARITIN) 10 MG tablet Take 10 mg by mouth daily as needed for allergies.    [provider]  memantine  (NAMENDA ) 10 MG tablet Take 1 tablet by mouth twice daily Patient taking differently: Take 10 mg by mouth 2 (two) times daily. 09/14/21   Gayland Domino PARAS, NP  methylPREDNISolone (MEDROL) 4 MG tablet Take 1 mg by mouth as needed (Fibermyalgia). 08/16/18   [provider]  omeprazole (PRILOSEC) 20 MG capsule  Take 20 mg by mouth daily as needed (acid reflux).     [provider]  ondansetron  (ZOFRAN ) 4 MG tablet Take 4 mg by mouth as needed for nausea or vomiting.    [provider]  potassium chloride  SA (K-DUR,KLOR-CON ) 20 MEQ tablet Take 20 mEq by mouth daily.    [provider]  Probiotic Product (PROBIOTIC PO) Take 1 tablet by mouth in the morning  and at bedtime.    [provider]  QUEtiapine (SEROQUEL) 25 MG tablet Take 25 mg by mouth daily. 03/30/23   [provider]  rosuvastatin  (CRESTOR ) 20 MG tablet Take 1 tablet (20 mg total) by mouth daily. 07/15/21   Vannie Reche RAMAN, NP    Family History Family History  Problem Relation Age of Onset   Alzheimer's disease Mother    Heart attack Father    Diabetes Brother     Social History Social History   Tobacco Use   Smoking status: Never   Smokeless tobacco: Never  Vaping Use   Vaping status: Never Used  Substance Use Topics   Alcohol use: Never   Drug use: Never     Allergies   Aspirin, Azithromycin, Infliximab, Methotrexate, Mushroom extract complex (obsolete), Nsaids, Avelox [moxifloxacin hcl in nacl], Cephalosporins, Clindamycin  hcl, Codeine, Diovan [valsartan], Donepezil , Donepezil  hcl, Glycopyrrolate, Hydromorphone , Moxifloxacin, Other, Oxycodone-acetaminophen , Amoxicillin, Atorvastatin, Contrast media [iodinated contrast media], Iodine, Latex, Metformin, Metformin and related, Penicillins, Percocet [oxycodone-acetaminophen ], Prednisone, Propofol, Sulfa antibiotics, and Vicodin [hydrocodone -acetaminophen ]   Review of Systems Review of Systems  Constitutional:  Negative for chills and fever.  HENT:  Negative for ear pain and sore throat.   Eyes:  Negative for pain and visual disturbance.  Respiratory:  Negative for cough and shortness of breath.   Cardiovascular:  Negative for chest pain and palpitations.  Gastrointestinal:  Positive for abdominal pain. Negative for constipation,  diarrhea, nausea and vomiting.  Genitourinary:  Positive for frequency and urgency. Negative for dysuria and hematuria.  Musculoskeletal:  Positive for back pain. Negative for arthralgias.  Skin:  Negative for color change and rash.  Neurological:  Negative for seizures and syncope.  All other systems reviewed and are negative.    Physical Exam Triage Vital Signs ED Triage Vitals  Encounter Vitals Group     BP 01/03/24 1133 110/69     Girls Systolic BP Percentile --      Girls Diastolic BP Percentile --      Boys Systolic BP Percentile --      Boys Diastolic BP Percentile --      Pulse Rate 01/03/24 1133 74     Resp 01/03/24 1133 20     Temp 01/03/24 1133 98.1 F (36.7 C)     Temp Source 01/03/24 1133 Oral     SpO2 01/03/24 1133 91 %     Weight --      Height --      Head Circumference --      Peak Flow --      Pain Score 01/03/24 1130 10     Pain Loc --      Pain Education --      Exclude from Growth Chart --    No data found.  Updated Vital Signs BP 110/69 (BP Location: Right Arm)   Pulse 74   Temp 98.1 F (36.7 C) (Oral)   Resp 20   SpO2 91%   Visual Acuity Right Eye Distance:   Left Eye Distance:   Bilateral Distance:    Right Eye Near:   Left Eye Near:    Bilateral Near:     Physical Exam Vitals and nursing note reviewed.  Constitutional:      General: She is not in acute distress.    Appearance: She is well-developed. She is obese. She is ill-appearing. She is not toxic-appearing or diaphoretic.  HENT:     Head: Normocephalic and atraumatic.     Right  Ear: Hearing, tympanic membrane and external ear normal. There is impacted cerumen (Some wax in the canal but tympanic membrane is visible and normal.).     Left Ear: Hearing, tympanic membrane and external ear normal. There is impacted cerumen (Some wax in the canal but tympanic membrane is visible and normal.).     Nose: No congestion or rhinorrhea.     Right Sinus: No maxillary sinus tenderness or  frontal sinus tenderness.     Left Sinus: No maxillary sinus tenderness or frontal sinus tenderness.     Mouth/Throat:     Lips: Pink.     Mouth: Mucous membranes are moist.     Pharynx: Uvula midline. No oropharyngeal exudate or posterior oropharyngeal erythema.     Tonsils: No tonsillar exudate.  Eyes:     Conjunctiva/sclera: Conjunctivae normal.     Pupils: Pupils are equal, round, and reactive to light.  Cardiovascular:     Rate and Rhythm: Normal rate and regular rhythm.     Pulses:          Dorsalis pedis pulses are 1+ on the right side and 1+ on the left side.       Posterior tibial pulses are 1+ on the right side and 1+ on the left side.     Heart sounds: S1 normal and S2 normal. No murmur heard. Pulmonary:     Effort: Pulmonary effort is normal. No respiratory distress.     Breath sounds: Normal breath sounds. No decreased breath sounds, wheezing, rhonchi or rales.  Abdominal:     General: Bowel sounds are normal.     Palpations: Abdomen is soft.     Tenderness: There is abdominal tenderness in the right lower quadrant, suprapubic area and left lower quadrant. There is no right CVA tenderness, left CVA tenderness, guarding or rebound. Negative signs include Murphy's sign, Rovsing's sign and McBurney's sign.  Musculoskeletal:        General: No swelling.     Cervical back: Neck supple.     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  It was surgically removed wants and was benign.  This lipoma is approximately 8 inches in diameter.) present.     Lumbar back: Spasms, tenderness and bony tenderness present. No swelling, edema, deformity, signs of trauma or lacerations. Decreased range of motion (Due to pain). No scoliosis.  Feet:     Comments: Feet are mottled with some chronic changes of stasis dermatitis.  Pulses are palpable. Lymphadenopathy:     Head:     Right side of head: No submental, submandibular, tonsillar, preauricular or posterior  auricular adenopathy.     Left side of head: No submental, submandibular, tonsillar, preauricular or posterior auricular adenopathy.     Cervical: No cervical adenopathy.     Right cervical: No superficial cervical adenopathy.    Left cervical: No superficial cervical adenopathy.  Skin:    General: Skin is warm and dry.     Capillary Refill: Capillary refill takes less than 2 seconds.     Findings: No rash.  Neurological:     Mental Status: She is alert and oriented to person, place, and time.  Psychiatric:        Mood and Affect: Mood normal.      UC Treatments / Results  Labs (all labs ordered are listed, but only abnormal results are displayed) Labs Reviewed - No data to display Basic Metabolic Panel (BMET): 17-Oct-1945:    Component Ref  Range & Units (hover) 3 wk ago (12/07/23)  Glucose 249 High   BUN 17  Creatinine, Ser 1.20 High   eGFR 46 Low   BUN/Creatinine Ratio 14  Sodium 142  Potassium 4.5  Chloride 103  CO2 23  Calcium  8.5 Low   Resulting Agency [facility]      EKG   Radiology DG Lumbar Spine Complete Result Date: 01/03/2024 CLINICAL DATA:  Chronic back pain. EXAM: LUMBAR SPINE - COMPLETE 4+ VIEW COMPARISON:  CT 11/18/2021 FINDINGS: Subtle curvature of the lumbar spine convex right unchanged. Mild diffuse decreased bone mineralization. Mild spondylosis throughout the lumbar spine to include facet arthropathy over the lower lumbar spine. Moderate disc space narrowing at the L2-3 level and to lesser extent at the L3-4 level and L1-2 levels. Stable L1 compression fracture post kyphoplasty. Mild-to-moderate anterior wedge compression deformity of T12 age indeterminate but new since October 2023. Remainder the exam is unchanged. IMPRESSION: 1. Mild-to-moderate anterior wedge compression deformity of T12 age indeterminate, but new since October 2023. 2. Stable L1 compression fracture post kyphoplasty. 3. 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 Electronically Signed   By: Toribio Agreste M.D.   On: 01/03/2024 12:58    Procedures Procedures (including critical care time)  Medications Ordered in UC Medications - No data to display  Initial Impression / Assessment and Plan / UC Course  I have reviewed the triage vital signs and the nursing notes.  Pertinent labs & imaging results that were available during my care of the patient were reviewed by me and considered in my medical decision making (see chart for details).  Plan of Care: Lower back pain with muscle spasms: X-rays show a T12 compression fracture that is new since 2023 but otherwise of undetermined age.  X-rays also show previous kyphoplasty and arthritis.  Patient and her daughter were still here and were updated about the results prior to discharge.  May use tramadol , 50 mg, every 6 hours if needed for pain.  Follow-up with primary care or orthopedics regarding back pain.  Abdominal pain with urinary frequency and urgency: Patient is unable to void.  Given supplies and she will try to get a specimen at home.  Will adjust the plan of care, if needed once the urine is evaluated.  Follow-up if symptoms do not improve, if symptoms worsen or if new symptoms occur.  I reviewed the plan of care with the patient and/or the patient's guardian.  The patient and/or guardian had time to ask questions and acknowledged that the questions were answered.  I provided instruction on symptoms or reasons to return here or to go to an ER, if symptoms/condition did not improve, worsened or if new symptoms occurred.  Final Clinical Impressions(s) / UC Diagnoses   Final diagnoses:  Right lower quadrant abdominal pain  Left lower quadrant abdominal pain  Suprapubic pain  Urinary frequency  Urinary urgency  Chronic bilateral low back pain without sciatica  Spasm of back muscles  Compression fracture of T12 vertebra, initial encounter [facility])     Discharge Instructions       Lower back pain with muscle spasms: X-rays appear negative.  Will update the patient after radiology review of the films.  May use tramadol , 50 mg, every 6 hours if needed for pain.  Follow-up with primary care or orthopedics regarding back pain.  Abdominal pain with urinary frequency and urgency: Patient is unable to void.  Given supplies and she will try to  get a specimen at home.  Will adjust the plan of care, if needed once the urine is evaluated.  Follow-up if symptoms do not improve, if symptoms worsen or if new symptoms occur.     ED Prescriptions     Medication Sig Dispense Auth. Provider   traMADol  (ULTRAM ) 50 MG tablet Take 1 tablet (50 mg total) by mouth every 6 (six) hours as needed. 15 tablet Manar Smalling, FNP      I have reviewed the PDMP during this encounter.   Ival Domino, FNP 01/03/24 [phone]

## 2024-01-03 NOTE — Telephone Encounter (Signed)
 Patient requesting call to discuss referral process and scheduling an appointment.

## 2024-01-03 NOTE — Discharge Instructions (Addendum)
 Lower back pain with muscle spasms: X-rays appear negative.  Will update the patient after radiology review of the films.  May use tramadol , 50 mg, every 6 hours if needed for pain.  Follow-up with primary care or orthopedics regarding back pain.  Abdominal pain with urinary frequency and urgency: Patient is unable to void.  Given supplies and she will try to get a specimen at home.  Will adjust the plan of care, if needed once the urine is evaluated.  Follow-up if symptoms do not improve, if symptoms worsen or if new symptoms occur.

## 2024-01-03 NOTE — ED Triage Notes (Signed)
 Pts daughter states the pt started to have low back spasms on Monday when she got up to go to the bathroom. Since then the pt has been having increased difficulty getting in and out of bed. She has previously had to have a spinal surgery years ago. They are also concerned she may have a UTI. She is having urinary frequency, some urgency, pelvic pain, and strong urine odor. Pt denies dysuria. Pt has taken tylenol  with no relief.

## 2024-01-04 NOTE — Telephone Encounter (Signed)
 Attempted to call patient to discuss scheduling appointment.  Left message for call back  No referral scanned into chart

## 2024-01-05 ENCOUNTER — Emergency Department (HOSPITAL_COMMUNITY)
Admission: EM | Admit: 2024-01-05 | Discharge: 2024-01-06 | Disposition: A | Discharge status: Home / Self Care | Attending: Emergency Medicine | Admitting: Emergency Medicine

## 2024-01-05 ENCOUNTER — Encounter (HOSPITAL_COMMUNITY): Payer: Self-pay | Admitting: *Deleted

## 2024-01-05 ENCOUNTER — Emergency Department (HOSPITAL_COMMUNITY)

## 2024-01-05 ENCOUNTER — Other Ambulatory Visit: Payer: Self-pay

## 2024-01-05 DIAGNOSIS — J9601 Acute respiratory failure with hypoxia: Secondary | ICD-10-CM | POA: Insufficient documentation

## 2024-01-05 DIAGNOSIS — Z91040 Latex allergy status: Secondary | ICD-10-CM | POA: Insufficient documentation

## 2024-01-05 DIAGNOSIS — R1084 Generalized abdominal pain: Secondary | ICD-10-CM | POA: Insufficient documentation

## 2024-01-05 DIAGNOSIS — R39198 Other difficulties with micturition: Secondary | ICD-10-CM | POA: Insufficient documentation

## 2024-01-05 DIAGNOSIS — I509 Heart failure, unspecified: Secondary | ICD-10-CM | POA: Diagnosis not present

## 2024-01-05 DIAGNOSIS — F0280 Dementia in other diseases classified elsewhere without behavioral disturbance: Secondary | ICD-10-CM | POA: Diagnosis not present

## 2024-01-05 DIAGNOSIS — E860 Dehydration: Secondary | ICD-10-CM | POA: Insufficient documentation

## 2024-01-05 DIAGNOSIS — G8929 Other chronic pain: Secondary | ICD-10-CM | POA: Diagnosis not present

## 2024-01-05 DIAGNOSIS — M545 Low back pain, unspecified: Secondary | ICD-10-CM | POA: Diagnosis not present

## 2024-01-05 DIAGNOSIS — Z7901 Long term (current) use of anticoagulants: Secondary | ICD-10-CM | POA: Insufficient documentation

## 2024-01-05 DIAGNOSIS — G309 Alzheimer's disease, unspecified: Secondary | ICD-10-CM | POA: Insufficient documentation

## 2024-01-05 DIAGNOSIS — E119 Type 2 diabetes mellitus without complications: Secondary | ICD-10-CM | POA: Diagnosis not present

## 2024-01-05 DIAGNOSIS — Z7984 Long term (current) use of oral hypoglycemic drugs: Secondary | ICD-10-CM | POA: Insufficient documentation

## 2024-01-05 DIAGNOSIS — R0902 Hypoxemia: Secondary | ICD-10-CM | POA: Diagnosis present

## 2024-01-05 LAB — COMPREHENSIVE METABOLIC PANEL WITH GFR
ALT: 16 U/L (ref 0–44)
AST: 16 U/L (ref 15–41)
Albumin: 2.6 g/dL — ABNORMAL LOW (ref 3.5–5.0)
Alkaline Phosphatase: 52 U/L (ref 38–126)
Anion gap: 10 (ref 5–15)
BUN: 12 mg/dL (ref 8–23)
CO2: 23 mmol/L (ref 22–32)
Calcium: 7.7 mg/dL — ABNORMAL LOW (ref 8.9–10.3)
Chloride: 103 mmol/L (ref 98–111)
Creatinine, Ser: 1.35 mg/dL — ABNORMAL HIGH (ref 0.44–1.00)
GFR, Estimated: 40 mL/min — ABNORMAL LOW (ref >60)
Glucose, Bld: 154 mg/dL — ABNORMAL HIGH (ref 70–99)
Potassium: 3.4 mmol/L — ABNORMAL LOW (ref 3.5–5.1)
Sodium: 136 mmol/L (ref 135–145)
Total Bilirubin: 1 mg/dL (ref 0.0–1.2)
Total Protein: 5.6 g/dL — ABNORMAL LOW (ref 6.5–8.1)

## 2024-01-05 LAB — URINALYSIS, ROUTINE W REFLEX MICROSCOPIC
Bilirubin Urine: NEGATIVE
Glucose, UA: NEGATIVE mg/dL
Hgb urine dipstick: NEGATIVE
Ketones, ur: NEGATIVE mg/dL
Leukocytes,Ua: NEGATIVE
Nitrite: NEGATIVE
Protein, ur: NEGATIVE mg/dL
Specific Gravity, Urine: 1.012 (ref 1.005–1.030)
pH: 5 (ref 5.0–8.0)

## 2024-01-05 LAB — CBC WITH DIFFERENTIAL/PLATELET
Abs Immature Granulocytes: 0.03 K/uL (ref 0.00–0.07)
Basophils Absolute: 0.1 K/uL (ref 0.0–0.1)
Basophils Relative: 1 %
Eosinophils Absolute: 0.1 K/uL (ref 0.0–0.5)
Eosinophils Relative: 2 %
HCT: 39 % (ref 36.0–46.0)
Hemoglobin: 12.9 g/dL (ref 12.0–15.0)
Immature Granulocytes: 0 %
Lymphocytes Relative: 23 %
Lymphs Abs: 1.7 K/uL (ref 0.7–4.0)
MCH: 31.9 pg (ref 26.0–34.0)
MCHC: 33.1 g/dL (ref 30.0–36.0)
MCV: 96.5 fL (ref 80.0–100.0)
Monocytes Absolute: 0.7 K/uL (ref 0.1–1.0)
Monocytes Relative: 10 %
Neutro Abs: 4.6 K/uL (ref 1.7–7.7)
Neutrophils Relative %: 64 %
Platelets: 158 K/uL (ref 150–400)
RBC: 4.04 MIL/uL (ref 3.87–5.11)
RDW: 13.4 % (ref 11.5–15.5)
WBC: 7.2 K/uL (ref 4.0–10.5)
nRBC: 0 % (ref 0.0–0.2)

## 2024-01-05 LAB — CBG MONITORING, ED: Glucose-Capillary: 158 mg/dL — ABNORMAL HIGH (ref 70–99)

## 2024-01-05 LAB — I-STAT CG4 LACTIC ACID, ED: Lactic Acid, Venous: 1 mmol/L (ref 0.5–1.9)

## 2024-01-05 LAB — LIPASE, BLOOD: Lipase: 21 U/L (ref 11–51)

## 2024-01-05 LAB — TROPONIN I (HIGH SENSITIVITY): Troponin I (High Sensitivity): 7 ng/L (ref <18)

## 2024-01-05 LAB — BRAIN NATRIURETIC PEPTIDE: B Natriuretic Peptide: 106.5 pg/mL — ABNORMAL HIGH (ref 0.0–100.0)

## 2024-01-05 MED ORDER — ONDANSETRON HCL 4 MG/2ML IJ SOLN: 4.0000 mg | Freq: Once | INTRAMUSCULAR | Status: DC

## 2024-01-05 MED ORDER — ONDANSETRON HCL 4 MG/2ML IJ SOLN
4.0000 mg | Freq: Once | INTRAMUSCULAR | Status: AC
  Administered 2024-01-05: 4 mg via INTRAVENOUS
  Filled 2024-01-05: qty 2

## 2024-01-05 MED ORDER — TRAMADOL HCL 50 MG PO TABS: 50.0000 mg | ORAL_TABLET | Freq: Four times a day (QID) | ORAL | 0 refills | Status: AC | PRN

## 2024-01-05 MED ORDER — SODIUM CHLORIDE 0.9 % IV BOLUS
1000.0000 mL | Freq: Once | INTRAVENOUS | Status: AC
  Administered 2024-01-05: 1000 mL via INTRAVENOUS

## 2024-01-05 MED ORDER — FENTANYL CITRATE (PF) 50 MCG/ML IJ SOSY
50.0000 ug | PREFILLED_SYRINGE | Freq: Once | INTRAMUSCULAR | Status: AC
  Administered 2024-01-05: 50 ug via INTRAVENOUS
  Filled 2024-01-05: qty 1

## 2024-01-05 MED ORDER — SODIUM CHLORIDE 0.9 % IV BOLUS: 1000.0000 mL | Freq: Once | INTRAVENOUS | Status: DC

## 2024-01-05 MED ORDER — LIDOCAINE 5 % EX PTCH: 1.0000 | MEDICATED_PATCH | CUTANEOUS | 0 refills | Status: AC

## 2024-01-05 NOTE — ED Notes (Signed)
 Patient transported to X-ray

## 2024-01-05 NOTE — ED Provider Notes (Signed)
 [place] EMERGENCY DEPARTMENT AT [facility] Provider Note   CSN: [id number] Arrival date & time: 01/05/24  1119     Patient presents with: Altered Mental Status   Kristin Gomez is a 78 y.o. female with history dementia who presents emergency department for evaluation of difficulty with ambulation and hypoxia.  Patient was evaluated at urgent care on Monday and was found to have a T-spine compression fracture.  Prior to this injury, she was able to ambulate without difficulty.  History is difficult to obtain as patient has known history of dementia.  However, on exam she is hypoxic to 83% on room air.  She has been placed on 4 L of O2 and has improved to 92%.  Patient also has significant abdominal tenderness to palpation as well as shortness of breath.  Per EMS, patient's family is en route.  Upon further discussion with patient's family members, patient has had abnormal urine output over the last day.  Patient has been complaining of generalized abdominal pain as well as back pain.  HPI     Prior to Admission medications   Medication Sig Start Date End Date Taking? Authorizing Provider  albuterol  (PROVENTIL  HFA;VENTOLIN  HFA) 108 (90 Base) MCG/ACT inhaler Inhale 2 puffs into the lungs every 6 (six) hours as needed for wheezing or shortness of breath.  11/20/12   [provider]  apixaban  (ELIQUIS ) 5 MG TABS tablet Take 1 tablet (5 mg total) by mouth 2 (two) times daily. 12/14/23   Revankar, Jennifer SAUNDERS, MD  budesonide  (PULMICORT ) 0.5 MG/2ML nebulizer solution Take 2 mLs (0.5 mg total) by nebulization 2 (two) times daily. 12/16/21   Hunsucker, Donnice SAUNDERS, MD  carvedilol  (COREG ) 12.5 MG tablet TAKE 1 TABLET BY MOUTH TWICE DAILY WITH A MEAL 11/21/23   Revankar, Jennifer SAUNDERS, MD  clonazePAM  (KLONOPIN ) 0.5 MG tablet Take 0.5 mg by mouth as directed. As needed    [provider]  diphenhydramine -acetaminophen  (TYLENOL  PM) 25-500 MG TABS tablet Take 1-2 tablets by mouth at  bedtime as needed (pain).    [provider]  fluticasone  (FLONASE ) 50 MCG/ACT nasal spray Place 1 spray into both nostrils 2 (two) times daily. Then decrease to daily. Patient taking differently: Place 1 spray into both nostrils as needed for allergies or rhinitis. 02/24/22   Hunsucker, Donnice SAUNDERS, MD  furosemide  (LASIX ) 20 MG tablet TAKE 1 TABLET BY MOUTH ONCE DAILY. INCREASE AS DIRECTED BASED ON WEIGHT 10/20/23   Revankar, Jennifer SAUNDERS, MD  glipiZIDE (GLUCOTROL) 5 MG tablet Take 5 mg by mouth at bedtime.    [provider]  levothyroxine  (SYNTHROID ) 25 MCG tablet Take 25 mcg by mouth daily before breakfast.    [provider]  lisinopril  (ZESTRIL ) 5 MG tablet Take 5 mg by mouth daily.    [provider]  loratadine (CLARITIN) 10 MG tablet Take 10 mg by mouth daily as needed for allergies.    [provider]  memantine  (NAMENDA ) 10 MG tablet Take 1 tablet by mouth twice daily Patient taking differently: Take 10 mg by mouth 2 (two) times daily. 09/14/21   Gayland Lauraine PARAS, NP  methylPREDNISolone (MEDROL) 4 MG tablet Take 1 mg by mouth as needed (Fibermyalgia). 08/16/18   [provider]  omeprazole (PRILOSEC) 20 MG capsule Take 20 mg by mouth daily as needed (acid reflux).     [provider]  ondansetron  (ZOFRAN ) 4 MG tablet Take 4 mg by mouth as needed for nausea or vomiting.  [provider]  potassium chloride  SA (K-DUR,KLOR-CON ) 20 MEQ tablet Take 20 mEq by mouth daily.    [provider]  Probiotic Product (PROBIOTIC PO) Take 1 tablet by mouth in the morning and at bedtime.    [provider]  QUEtiapine (SEROQUEL) 25 MG tablet Take 25 mg by mouth daily. 03/30/23   [provider]  rosuvastatin  (CRESTOR ) 20 MG tablet Take 1 tablet (20 mg total) by mouth daily. 07/15/21   Vannie Reche RAMAN, NP  traMADol  (ULTRAM ) 50 MG tablet Take 1 tablet (50 mg total) by mouth every 6 (six) hours as needed. 01/03/24   Ival Domino, FNP    Allergies: Aspirin, Azithromycin, Infliximab, Methotrexate, Mushroom extract complex (obsolete), Nsaids, Avelox [moxifloxacin hcl in nacl], Cephalosporins, Clindamycin  hcl, Codeine, Diovan [valsartan], Donepezil , Donepezil  hcl, Glycopyrrolate, Hydromorphone , Moxifloxacin, Other, Oxycodone-acetaminophen , Amoxicillin, Atorvastatin, Contrast media [iodinated contrast media], Iodine, Latex, Metformin, Metformin and related, Penicillins, Percocet [oxycodone-acetaminophen ], Prednisone, Propofol, Sulfa antibiotics, and Vicodin [hydrocodone -acetaminophen ]    Review of Systems  Respiratory:  Positive for shortness of breath.   Musculoskeletal:  Positive for back pain.    Updated Vital Signs BP 104/67 (BP Location: Right Arm)   Pulse 77   Temp 99.8 F (37.7 C) (Oral)   Resp 20   Ht 5' 4 (1.626 m)   Wt 63.5 kg   SpO2 99%   BMI 24.03 kg/m   Physical Exam Vitals and nursing note reviewed.  Constitutional:      Appearance: Normal appearance.  HENT:     Head: Normocephalic and atraumatic.     Mouth/Throat:     Mouth: Mucous membranes are moist.  Eyes:     General: No scleral icterus.       Right eye: No discharge.        Left eye: No discharge.     Conjunctiva/sclera: Conjunctivae normal.  Cardiovascular:     Rate and Rhythm: Normal rate and regular rhythm.     Pulses: Normal pulses.  Pulmonary:     Breath sounds: Normal breath sounds.     Comments: Mildly tachypneic on room air. Abdominal:     General: There is no distension.     Tenderness: There is abdominal tenderness.     Comments: Significant generalized abdominal tenderness to palpation.  Musculoskeletal:        General: No deformity.     Cervical back: Normal range of motion.  Skin:    General: Skin is warm and dry.     Capillary Refill: Capillary refill takes less than 2 seconds.  Neurological:     Mental Status: She is alert.     Motor: No weakness.     Comments: Patient has dementia at baseline.  Per  family, patient is no more confused than normal.  Cranial nerves 3-12 grossly intact. DTRs normal and symmetric. Equal grip strength bilateral with 5/5 strength against resistance in upper and lower extremities. No sensory or motor deficits appreciated.   Psychiatric:        Mood and Affect: Mood normal.     (all labs ordered are listed, but only abnormal results are displayed) Labs Reviewed  CBG MONITORING, ED - Abnormal; Notable for the following components:      Result Value   Glucose-Capillary 158 (*)    All other components within normal limits  CULTURE, BLOOD (ROUTINE X 2)  CULTURE, BLOOD (ROUTINE X 2)  CBC WITH DIFFERENTIAL/PLATELET  COMPREHENSIVE METABOLIC PANEL WITH GFR  LIPASE, BLOOD  URINALYSIS, ROUTINE W REFLEX  MICROSCOPIC  I-STAT CG4 LACTIC ACID, ED  I-STAT CG4 LACTIC ACID, ED  TROPONIN I (HIGH SENSITIVITY)  TROPONIN I (HIGH SENSITIVITY)    EKG: EKG Interpretation Date/Time:  Friday January 05 2024 11:32:58 EST Ventricular Rate:  78 PR Interval:  160 QRS Duration:  153 QT Interval:  441 QTC Calculation: 503 R Axis:   -72  Text Interpretation: VENTRICULAR PACED RHYTHM Left bundle branch block Confirmed by Cleotilde Rogue ([id number]) on 01/05/2024 12:12:15 PM  Radiology: No results found.    .Critical Care  Performed by: Torrence Marry RAMAN, PA-C Authorized by: Torrence Marry RAMAN, PA-C   Critical care provider statement:    Critical care time (minutes):  40   Critical care was necessary to treat or prevent imminent or life-threatening deterioration of the following conditions:  Circulatory failure, respiratory failure and dehydration   Critical care was time spent personally by me on the following activities:  Blood draw for specimens, development of treatment plan with patient or surrogate, discussions with consultants, discussions with primary provider, evaluation of patient's response to treatment, examination of patient, obtaining history from patient or  surrogate, review of old charts, re-evaluation of patient's condition, pulse oximetry, ordering and review of radiographic studies, ordering and review of laboratory studies and ordering and performing treatments and interventions   I assumed direction of critical care for this patient from another provider in my specialty: no     Care discussed with: admitting provider      Medications Ordered in the ED  sodium chloride  0.9 % bolus 1,000 mL (has no administration in time range)                                  Medical Decision Making Amount and/or Complexity of Data Reviewed Labs: ordered. Radiology: ordered.   This patient presents to the ED for concern of difficulty with ambulation after known compression fracture in her L-spine, this involves an extensive number of treatment options, and is a complaint that carries with it a high risk of complications and morbidity.  Differential diagnosis includes: UTI, pyelonephritis, SBO, septic arthritis, constipation, sepsis, hypoxemia, heart failure exacerbation  Co morbidities:  CHF, diabetes, memory impairment    Additional history: Patient was evaluated in urgent care on 11/19 for new complaint of acute on chronic low back pain.  At that time, her x-ray showed a T12 compression fracture that is new since 2023 but otherwise of undetermined age.  Patient was given a prescription for tramadol  to take every 6 hours as needed for pain.  Patient was also evaluated for abdominal pain and urinary frequency at that time.  Per chart review, FNP noted patient was unable to void.  Lab Tests:  I Ordered, and personally interpreted labs.  The pertinent results include: No acute abnormalities at this time.  Patient's UA is still pending as she has been unable to give urine.  Imaging Studies:  I ordered imaging studies including CT abdomen pelvis without contrast, chest x-ray I independently visualized and interpreted imaging which showed   CT: 1.  No acute findings in the abdomen or pelvis.  2. Interval mild inferior endplate compression deformity of T12 with less than  20% height loss, correlate with point tenderness if present.    I agree with the radiologist interpretation  Cardiac Monitoring/ECG:  The patient was maintained on a cardiac monitor.  I personally viewed and interpreted the cardiac monitored which showed an  underlying rhythm of: Left bundle branch block, ventricular paced rhythm  Medicines ordered and prescription drug management:  I ordered medication including  Medications  sodium chloride  0.9 % bolus 1,000 mL (has no administration in time range)   for dehydration Reevaluation of the patient after these medicines showed that the patient improved I have reviewed the patients home medicines and have made adjustments as needed  Test Considered:   none  Critical Interventions:   new oxygen requirement  Consultations Obtained: None at this time  Problem List / ED Course:     ICD-10-CM   1. Acute hypoxic respiratory failure (HCC)  J96.01     2. Dehydration  E86.0     3. Alzheimer's dementia, unspecified dementia severity, unspecified timing of dementia onset, unspecified whether behavioral, psychotic, or mood disturbance or anxiety (HCC)  G30.9    F02.80       MDM: 78 year old female who presents emergency department for evaluation of lower back pain with difficulty ambulating, difficulty with urination and low oxygen saturation.  EMS was called by family, who reported patient was experiencing ambulation difficulty.  Patient was evaluated urgent care on 11/19 and found to have a T12 compression fracture that is new since 2023 but otherwise of undetermined age.  X-ray at urgent care also showed previous kyphoplasty and arthritis.  EMS also reported patient was hypoxic to the 80s upon their arrival.  She was placed on 4 L nasal cannula en route and improved to 93%.  On exam, patient is mildly tachypneic  on room air and repeat pulse ox shows 87%.  She is replaced on 4 L nasal cannula and has improved to the low 90s.  Patient's abdomen is also exquisitely tender in all quadrants.  I will order a CT scan.  Additionally, I have ordered a chest x-ray, troponin, basic labs and urine.  These test are pending at this time.  EMS stated that patient's family should be arriving to bedside shortly.  Upon further assessment, patient's upper extremities are intact to sensation and 5 out of 5 strength.  Patient's lower extremities also have 5 out of 5 sensation and strength.  She does have a bilateral positive straight leg raise.  I suspect this is likely secondary to her new L-spine compression fracture that was found on 11/19.  At this time, I have ordered a CBC, CMP, lipase, CT of her abdomen pelvis, lactic, blood cultures, chest x-ray to assess for possible sepsis.  Patient is afebrile at this time.  However, per patient's family, she has not urinated since last night.  Bladder scan showed 55 mL of urine.  Therefore, I ordered IV fluids for rehydration.  Patient has been unable to give urine at this time.  The rest of her lab work is unremarkable.  Handoff has been given to [place], PA-C who will follow-up on UA.  I anticipate this patient will need likely admission and additional workup.    Dispostion:  After consideration of the diagnostic results and the patients response to treatment, I feel that the patient would benefit from further workup and possible admission.  Final diagnoses:  Acute hypoxic respiratory failure (HCC)  Dehydration  Alzheimer's dementia, unspecified dementia severity, unspecified timing of dementia onset, unspecified whether behavioral, psychotic, or mood disturbance or anxiety [facility])    ED Discharge Orders     None          Torrence Marry GORMAN DEVONNA 01/05/24 1531    Cleotilde Rogue, MD 01/06/24 1116

## 2024-01-05 NOTE — Discharge Instructions (Signed)
 As we discussed, you can continue Tramadol  for pain. Add the lidocaine  patch as directed and give Tylenol  every 6 hours, maximum dose 4000 mg every 24 hours. Keep your scheduled appointment with Dr. Mavis for further management of back pain.   Monitor the oxygen level at home and continue 2 liters oxygen use to keep the level above 92%. If more is needed, return to the emergency department.   Social Work should reach out to you tomorrow to evaluated in-home health care needs.

## 2024-01-05 NOTE — ED Triage Notes (Signed)
 Patient presents to ed via [facility]AR from home daughters at bedside states patient was sitting on the commode Monday bend over and heard something pop in her upper back,   was seen on Wed at [facility]  had xrays and was told she had a thoracic fracture, c/o decreased appetite and decreased urine output. Patient has hx. Of dementia.

## 2024-01-05 NOTE — ED Notes (Signed)
 Bladder scan done 55 cc total in bladder

## 2024-01-05 NOTE — TOC CM/SW Note (Signed)
 TOC consult received for possible HH/DME needs at d/c. Will need therapy recs for placement/DME. CM to further assess for needs as appropriate.   Merilee Batty, MSN, RN Case Management [phone]

## 2024-01-05 NOTE — ED Notes (Signed)
Urine specimen recollected and sent to lab

## 2024-01-05 NOTE — ED Notes (Signed)
 Bladder scanned twice, first time 55ml and the 2nd was 53ml

## 2024-01-05 NOTE — ED Provider Notes (Signed)
  Physical Exam  BP (!) 106/59   Pulse 72   Temp 99.8 F (37.7 C) (Oral)   Resp 20   Ht 5' 4 (1.626 m)   Wt 63.5 kg   SpO2 99%   BMI 24.03 kg/m   Physical Exam  Procedures  Procedures  ED Course / MDM    Medical Decision Making Amount and/or Complexity of Data Reviewed Labs: ordered. Radiology: ordered.  Risk Prescription drug management.   Dementia aat baseline REcently, onset low back pain Per UC ?new compression fracture Difficulty ambulating AMS work up negative.  Dehydrated - getting fluids CT abd pel for generalized tenderness - neg  If urine negative, MR recommended but has pacer Currently on 4L - arrived in the 80's Per daughters - prn O2 at home  3:45 - Introduced myself to the patient and daughters. Confirmed: 1 - Back pain, unable to get up and ambulate secondary to pain. Review of recent imaging shows a T12 fracture with 20% loss of height. No neurologic deficits on exam. Feels she will need MR imaging at some point which will have to accommodate her pacemaker. She has an appointment with neurosurgery on Dec 2nd.  2 - oxygen requirement: she uses oxygen at home when she feels SOB. Per daughters she has not reported SOB to them so she was not wearing O2 at home. Showing O2 saturations to mid-80's on arrival. CXR read delayed. Discussed with [place] radiology.  3 - ?UTI - patient was given IV fluids and did empty her bladder, unfortunately, it was into her diaper. [place]wick applied. Will give PO fluids and wait for sample. Further IV fluids avoided with h/o CHF.  8:00 - UA is negative for infection. Oxygen has been stable on 4L, decreased to 2L and remains 95%-97%. Still has back pain. Taking tramadol  at home. Discussed improved pain control with lidocaine  patch and adding Tylenol . She has multiple allergies to medications.   Family is comfortable with taking her home. MRI felt an appropriate component of her evaluation will need to be coordinated with pacemaker  care. With no neurologic deficits, this is felt ok for outpatient. Will have TOC reach out to family for assistance. Re: oxygen requirement, she uses O2 at home prn. Family has a pulse oximeter and oxygen to use and will keep monitoring.   She will be transported home by [facility] TOC in place for outpatient follow up. Strict return precautions discussed with family who are understanding and felt reliable to monitor care for signs they should return to the ED.       Odell Balls, PA-C 01/05/24 2053    Cleotilde Rogue, MD 01/06/24 1116

## 2024-01-10 ENCOUNTER — Telehealth: Payer: Self-pay | Admitting: Cardiology

## 2024-01-10 LAB — CULTURE, BLOOD (ROUTINE X 2)
Culture: NO GROWTH
Culture: NO GROWTH

## 2024-01-10 NOTE — Telephone Encounter (Signed)
 Daughter says patient is under palliative care and in office PPM checks are not possible at this time because patient is unable to move--FYI. If questions or comments, daughter can be contacted at [phone]. Please advise.

## 2024-01-10 NOTE — Telephone Encounter (Signed)
 Noted.  Will continue to follow remotely.  Will send to Dr. Inocencio for awareness.

## 2024-01-16 ENCOUNTER — Ambulatory Visit: Payer: Medicare HMO

## 2024-01-16 DIAGNOSIS — I480 Paroxysmal atrial fibrillation: Secondary | ICD-10-CM | POA: Diagnosis not present

## 2024-01-17 LAB — CUP PACEART REMOTE DEVICE CHECK
Battery Remaining Longevity: 14 mo
Battery Remaining Percentage: 19 %
Battery Voltage: 2.89 V
Brady Statistic AP VP Percent: 29 %
Brady Statistic AP VS Percent: 1 %
Brady Statistic AS VP Percent: 70 %
Brady Statistic AS VS Percent: 1 %
Brady Statistic RA Percent Paced: 28 %
Date Time Interrogation Session: 20251202020013
Implantable Lead Connection Status: 753985
Implantable Lead Connection Status: 753985
Implantable Lead Connection Status: 753985
Implantable Lead Implant Date: 20081110
Implantable Lead Implant Date: 20081110
Implantable Lead Implant Date: 20081110
Implantable Lead Location: 753858
Implantable Lead Location: 753859
Implantable Lead Location: 753860
Implantable Pulse Generator Implant Date: 20201204
Lead Channel Impedance Value: 480 Ohm
Lead Channel Impedance Value: 480 Ohm
Lead Channel Impedance Value: 860 Ohm
Lead Channel Pacing Threshold Amplitude: 0.625 V
Lead Channel Pacing Threshold Amplitude: 1.25 V
Lead Channel Pacing Threshold Amplitude: 1.25 V
Lead Channel Pacing Threshold Pulse Width: 0.4 ms
Lead Channel Pacing Threshold Pulse Width: 0.4 ms
Lead Channel Pacing Threshold Pulse Width: 1.5 ms
Lead Channel Sensing Intrinsic Amplitude: 12 mV
Lead Channel Sensing Intrinsic Amplitude: 2.1 mV
Lead Channel Setting Pacing Amplitude: 1.625
Lead Channel Setting Pacing Amplitude: 2.25 V
Lead Channel Setting Pacing Amplitude: 2.5 V
Lead Channel Setting Pacing Pulse Width: 0.4 ms
Lead Channel Setting Pacing Pulse Width: 1.5 ms
Lead Channel Setting Sensing Sensitivity: 4 mV
Pulse Gen Model: 3222
Pulse Gen Serial Number: 9132853

## 2024-01-19 NOTE — Progress Notes (Signed)
 Remote PPM Transmission

## 2024-01-24 ENCOUNTER — Ambulatory Visit: Payer: Self-pay | Admitting: Cardiology
